# Patient Record
Sex: Male | Born: 1975 | Race: White | Hispanic: No | Marital: Married | Smoking: Former smoker
Health system: Southern US, Community
[De-identification: ages and names within clinical notes are randomized; demographics above are authoritative.]

## PROBLEM LIST (undated history)

## (undated) DIAGNOSIS — K7460 Unspecified cirrhosis of liver: Secondary | ICD-10-CM

## (undated) DIAGNOSIS — E079 Disorder of thyroid, unspecified: Secondary | ICD-10-CM

## (undated) DIAGNOSIS — K4090 Unilateral inguinal hernia, without obstruction or gangrene, not specified as recurrent: Secondary | ICD-10-CM

## (undated) HISTORY — DX: Unspecified cirrhosis of liver: K74.60

## (undated) HISTORY — PX: KNEE SURGERY: SHX244

## (undated) HISTORY — PX: APPENDECTOMY (OPEN): SHX54

## (undated) HISTORY — DX: Unilateral inguinal hernia, without obstruction or gangrene, not specified as recurrent: K40.90

---

## 1994-04-02 ENCOUNTER — Emergency Department: Admit: 1994-04-02 | Payer: Self-pay

## 1997-02-04 ENCOUNTER — Ambulatory Visit
Admission: EM | Admit: 1997-02-04 | Disposition: A | Discharge status: Home / Self Care | Payer: Self-pay | Admitting: Specialist

## 2001-07-06 ENCOUNTER — Emergency Department: Admit: 2001-07-06 | Payer: Self-pay | Admitting: Emergency Medicine

## 2002-11-08 ENCOUNTER — Emergency Department: Admit: 2002-11-08 | Payer: Self-pay | Source: Emergency Department | Admitting: Family Medicine

## 2004-01-24 ENCOUNTER — Emergency Department: Admit: 2004-01-24 | Payer: Self-pay | Source: Emergency Department | Admitting: Emergency Medicine

## 2008-07-26 ENCOUNTER — Inpatient Hospital Stay
Admission: EM | Admit: 2008-07-26 | Disposition: A | Discharge status: Home / Self Care | Payer: Self-pay | Source: Emergency Department | Admitting: Family Medicine

## 2008-08-05 LAB — ^CBC WITH DIFF MCKESSON
BASOPHILS %: 0.2 % (ref 0–2)
BASOPHILS %: 0.5 % (ref 0–2)
Baso(Absolute): 0
Baso(Absolute): 0
Eosinophils %: 4.7 % (ref 0–6)
Eosinophils %: 3.9 % (ref 0–6)
Eosinophils Absolute: 0.3
Eosinophils Absolute: 0.3
Hematocrit: 42.3 % (ref 39.0–49.0)
Hematocrit: 37.5 % — ABNORMAL LOW (ref 39.0–49.0)
Hematocrit: 33.9 % — ABNORMAL LOW (ref 39.0–49.0)
Hematocrit: 31.9 % — ABNORMAL LOW (ref 39.0–49.0)
Hematocrit: 33.2 % — ABNORMAL LOW (ref 39.0–49.0)
Hemoglobin: 15.1 g/dL (ref 13.2–17.3)
Hemoglobin: 13.4 g/dL (ref 13.2–17.3)
Hemoglobin: 12.2 g/dL — ABNORMAL LOW (ref 13.2–17.3)
Hemoglobin: 11.3 g/dL — ABNORMAL LOW (ref 13.2–17.3)
Hemoglobin: 11.8 g/dL — ABNORMAL LOW (ref 13.2–17.3)
Lymphocytes Absolute: 1.6
Lymphocytes Absolute: 1.7
Lymphocytes Relative: 25 % (ref 25–55)
Lymphocytes Relative: 24.3 % — ABNORMAL LOW (ref 25–55)
MCH: 36.1 pg — ABNORMAL HIGH (ref 27.0–34.0)
MCH: 36.7 pg — ABNORMAL HIGH (ref 27.0–34.0)
MCH: 37 pg — ABNORMAL HIGH (ref 27.0–34.0)
MCH: 36.6 pg — ABNORMAL HIGH (ref 27.0–34.0)
MCH: 36.8 pg — ABNORMAL HIGH (ref 27.0–34.0)
MCHC: 35.6 % (ref 32.0–36.0)
MCHC: 35.8 % (ref 32.0–36.0)
MCHC: 36 % (ref 32.0–36.0)
MCHC: 35.4 % (ref 32.0–36.0)
MCHC: 35.7 % (ref 32.0–36.0)
MCV: 101.5 fL — ABNORMAL HIGH (ref 80–100)
MCV: 102.5 fL — ABNORMAL HIGH (ref 80–100)
MCV: 102.8 fL — ABNORMAL HIGH (ref 80–100)
MCV: 103.3 fL — ABNORMAL HIGH (ref 80–100)
MCV: 103.2 fL — ABNORMAL HIGH (ref 80–100)
Monocytes Absolute: 1
Monocytes Absolute: 1
Monocytes Relative %: 16.4 % — ABNORMAL HIGH (ref 1–8)
Monocytes Relative %: 14.5 % — ABNORMAL HIGH (ref 1–8)
Neutrophils Absolute: 3.4
Neutrophils Absolute: 4
Neutrophils Relative %: 53.7 % (ref 49–69)
Neutrophils Relative %: 56.8 % (ref 49–69)
Platelets: 191 10*3/uL (ref 150–400)
Platelets: 151 10*3/uL (ref 150–400)
Platelets: 128 10*3/uL — ABNORMAL LOW (ref 150–400)
Platelets: 171 10*3/uL (ref 150–400)
Platelets: 211 10*3/uL (ref 150–400)
RBC: 4.17 /mm3 (ref 3.80–5.40)
RBC: 3.66 /mm3 — ABNORMAL LOW (ref 3.80–5.40)
RBC: 3.3 /mm3 — ABNORMAL LOW (ref 3.80–5.40)
RBC: 3.09 /mm3 — ABNORMAL LOW (ref 3.80–5.40)
RBC: 3.21 /mm3 — ABNORMAL LOW (ref 3.80–5.40)
RDW: 13.7 % (ref 11.0–14.0)
RDW: 14.1 % — ABNORMAL HIGH (ref 11.0–14.0)
RDW: 13.9 % (ref 11.0–14.0)
RDW: 14.1 % — ABNORMAL HIGH (ref 11.0–14.0)
RDW: 14.1 % — ABNORMAL HIGH (ref 11.0–14.0)
WBC: 12.3 10*3/uL — ABNORMAL HIGH (ref 4.8–10.8)
WBC: 12.6 10*3/uL — ABNORMAL HIGH (ref 4.8–10.8)
WBC: 12.9 10*3/uL — ABNORMAL HIGH (ref 4.8–10.8)
WBC: 6.3 10*3/uL (ref 4.8–10.8)
WBC: 7.1 10*3/uL (ref 4.8–10.8)

## 2008-08-05 LAB — COMPREHENSIVE METABOLIC PANEL
ALT: 165 U/L — ABNORMAL HIGH (ref 7–56)
ALT: 97 U/L — ABNORMAL HIGH (ref 7–56)
ALT: 60 U/L — ABNORMAL HIGH (ref 7–56)
ALT: 50 U/L (ref 7–56)
AST (SGOT): 350 U/L — ABNORMAL HIGH (ref 5–40)
AST (SGOT): 128 U/L — ABNORMAL HIGH (ref 5–40)
AST (SGOT): 73 U/L — ABNORMAL HIGH (ref 5–40)
AST (SGOT): 62 U/L — ABNORMAL HIGH (ref 5–40)
Albumin, Synovial: 4.5 g/dL (ref 3.9–5.0)
Albumin, Synovial: 3.7 g/dL — ABNORMAL LOW (ref 3.9–5.0)
Albumin, Synovial: 3.4 g/dL — ABNORMAL LOW (ref 3.9–5.0)
Albumin, Synovial: 3 g/dL — ABNORMAL LOW (ref 3.9–5.0)
Alkaline Phosphatase: 88 U/L (ref 38–126)
Alkaline Phosphatase: 69 U/L (ref 38–126)
Alkaline Phosphatase: 74 U/L (ref 38–126)
Alkaline Phosphatase: 79 U/L (ref 38–126)
BUN / Creatinine Ratio: 16 (ref 8–20)
BUN / Creatinine Ratio: 12 (ref 8–20)
BUN / Creatinine Ratio: 10 (ref 8–20)
BUN / Creatinine Ratio: 10 (ref 8–20)
BUN: 15 mg/dL (ref 6–20)
BUN: 11 mg/dL (ref 6–20)
BUN: 8 mg/dL (ref 6–20)
BUN: 7 mg/dL (ref 6–20)
Bilirubin, Total: 1.6 mg/dL — ABNORMAL HIGH (ref 0.2–1.3)
Bilirubin, Total: 1.4 mg/dL — ABNORMAL HIGH (ref 0.2–1.3)
Bilirubin, Total: 1.5 mg/dL — ABNORMAL HIGH (ref 0.2–1.3)
Bilirubin, Total: 0.7 mg/dL (ref 0.2–1.3)
CO2: 24 mmol/L (ref 21.0–31.0)
CO2: 24 mmol/L (ref 21.0–31.0)
CO2: 23 mmol/L (ref 21.0–31.0)
CO2: 24 mmol/L (ref 21.0–31.0)
Calcium: 9.5 mg/dL (ref 8.4–10.2)
Calcium: 8.6 mg/dL (ref 8.4–10.2)
Calcium: 8.4 mg/dL (ref 8.4–10.2)
Calcium: 8.3 mg/dL — ABNORMAL LOW (ref 8.4–10.2)
Chloride: 99 mmol/L — ABNORMAL LOW (ref 101–111)
Chloride: 101 mmol/L (ref 101–111)
Chloride: 100 mmol/L — ABNORMAL LOW (ref 101–111)
Chloride: 106 mmol/L (ref 101–111)
Creatinine: 0.95 mg/dL (ref 0.66–1.25)
Creatinine: 0.89 mg/dL (ref 0.66–1.25)
Creatinine: 0.81 mg/dL (ref 0.66–1.25)
Creatinine: 0.76 mg/dL (ref 0.66–1.25)
EGFR: >60 mL/min/{1.73_m2}
EGFR: >60 mL/min/{1.73_m2}
EGFR: >60 mL/min/{1.73_m2}
EGFR: >60 mL/min/{1.73_m2}
EGFR: >60 mL/min/{1.73_m2}
EGFR: >60 mL/min/{1.73_m2}
EGFR: >60 mL/min/{1.73_m2}
EGFR: >60 mL/min/{1.73_m2}
Glucose: 89 mg/dL (ref 70–100)
Glucose: 69 mg/dL — ABNORMAL LOW (ref 70–100)
Glucose: 67 mg/dL — ABNORMAL LOW (ref 70–100)
Glucose: 86 mg/dL (ref 70–100)
Potassium: 3.8 mmol/L (ref 3.6–5.0)
Potassium: 4.1 mmol/L (ref 3.6–5.0)
Potassium: 3.6 mmol/L (ref 3.6–5.0)
Potassium: 3.3 mmol/L — ABNORMAL LOW (ref 3.6–5.0)
Protein, Total: 8 g/dL (ref 6.3–8.2)
Protein, Total: 6.7 g/dL (ref 6.3–8.2)
Protein, Total: 6.5 g/dL (ref 6.3–8.2)
Protein, Total: 5.9 g/dL — ABNORMAL LOW (ref 6.3–8.2)
Sodium: 133 mmol/L — ABNORMAL LOW (ref 135–145)
Sodium: 134 mmol/L — ABNORMAL LOW (ref 135–145)
Sodium: 131 mmol/L — ABNORMAL LOW (ref 135–145)
Sodium: 137 mmol/L (ref 135–145)

## 2008-08-05 LAB — ^MANUAL DIFFERENTIAL MCKESSON
Band Neutrophils Manual: 4 %
Band Neutrophils Manual: 2 %
Band Neutrophils Manual: 1 %
Basophils Manual: 1 % (ref 0–2)
CELLS COUNTED: 100
CELLS COUNTED: 100
CELLS COUNTED: 100
Eosinophils Manual: 3 % (ref 0–6)
Lymphocytes Manual: 14 % — ABNORMAL LOW (ref 25–55)
Lymphocytes Manual: 13 % — ABNORMAL LOW (ref 25–55)
Lymphocytes Manual: 5 % — ABNORMAL LOW (ref 25–55)
Monocytes: 7 % (ref 1–8)
Monocytes: 9 % — ABNORMAL HIGH (ref 1–8)
Monocytes: 16 % — ABNORMAL HIGH (ref 1–8)
Platelet Evaluation: NORMAL
Platelet Evaluation: NORMAL
Platelet Evaluation: DECREASED
RBC Morphology: NORMAL
RBC Morphology: NORMAL
RBC Morphology: NORMAL
Segmented Neutrophils: 75 % — ABNORMAL HIGH (ref 49–69)
Segmented Neutrophils: 72 % — ABNORMAL HIGH (ref 49–69)
Segmented Neutrophils: 78 % — ABNORMAL HIGH (ref 49–69)

## 2008-08-05 LAB — STOOL FOR WBC
Stool WBC: NONE SEEN
Stool WBC: NONE SEEN

## 2008-08-05 LAB — BASIC METABOLIC PANEL
BUN / Creatinine Ratio: 11 (ref 8–20)
BUN: 8 mg/dL (ref 6–20)
CO2: 24 mmol/L (ref 21.0–31.0)
Calcium: 9.3 mg/dL (ref 8.4–10.2)
Chloride: 106 mmol/L (ref 101–111)
Creatinine: 0.77 mg/dL (ref 0.66–1.25)
EGFR: >60 mL/min/{1.73_m2}
EGFR: >60 mL/min/{1.73_m2}
Glucose: 73 mg/dL (ref 70–100)
Potassium: 3.9 mmol/L (ref 3.6–5.0)
Sodium: 138 mmol/L (ref 135–145)

## 2008-08-05 LAB — CLOSTRIDIUM DIFFICILE TOXINS A/T/B
Stool Clostridium difficile Toxin A and B: NEGATIVE
Stool Clostridium difficile Toxin A and B: NEGATIVE

## 2008-08-05 LAB — TRIGLYCERIDES: Triglycerides: 295 mg/dL — ABNORMAL HIGH (ref 27–125)

## 2008-08-05 LAB — LIPASE
Lipase: 887 U/L — ABNORMAL HIGH (ref 23–300)
Lipase: 1222 U/L — ABNORMAL HIGH (ref 23–300)
Lipase: 466 U/L — ABNORMAL HIGH (ref 23–300)
Lipase: 719 U/L — ABNORMAL HIGH (ref 23–300)
Lipase: 1079 U/L — ABNORMAL HIGH (ref 23–300)

## 2008-08-05 LAB — T4, FREE: T4 Free: 1 ng/dL (ref 0.78–2.19)

## 2008-08-05 LAB — MAGNESIUM: Magnesium: 2.1 mg/dL (ref 1.7–2.2)

## 2008-08-05 LAB — TSH, 3RD GENERATION: TSH, 3rd Generation: 10.7 mIU/L — ABNORMAL HIGH (ref 0.465–4.680)

## 2008-10-19 ENCOUNTER — Emergency Department
Admission: EM | Admit: 2008-10-19 | Disposition: A | Discharge status: Home / Self Care | Payer: Self-pay | Source: Emergency Department | Admitting: Emergency Medical Services

## 2008-12-29 ENCOUNTER — Emergency Department
Admission: EM | Admit: 2008-12-29 | Disposition: A | Discharge status: Home / Self Care | Payer: Self-pay | Source: Emergency Department | Admitting: Emergency Medicine

## 2009-01-12 ENCOUNTER — Inpatient Hospital Stay
Admission: EM | Admit: 2009-01-12 | Disposition: A | Discharge status: Home / Self Care | Payer: Self-pay | Source: Emergency Department | Admitting: Internal Medicine

## 2009-01-14 LAB — URINALYSIS
Bilirubin, UA: NEGATIVE
Blood, UA: NEGATIVE
Glucose, UA: NEGATIVE
Ketones UA: NEGATIVE
Leukocyte Esterase, UA: NEGATIVE
Nitrate: NEGATIVE
Protein, UR: NEGATIVE
Specific Gravity, UR: 1.016 (ref 1.000–1.035)
Urobilinogen, UA: NORMAL
pH, Urine: 7 (ref 5.0–8.0)

## 2009-01-14 LAB — CBC AND DIFFERENTIAL
BASOPHILS %: 0.2 % (ref 0.0–2.0)
BASOPHILS %: 0 % (ref 0.0–2.0)
Baso(Absolute): 0.01 10*3/uL (ref 0.00–0.20)
Baso(Absolute): 0 10*3/uL (ref 0.00–0.20)
Eosinophils %: 0.3 % (ref 0.0–6.0)
Eosinophils %: 0 % (ref 0.0–6.0)
Eosinophils Absolute: 0.02 10*3/uL — ABNORMAL LOW (ref 0.10–0.30)
Eosinophils Absolute: 0 10*3/uL — ABNORMAL LOW (ref 0.10–0.30)
Hematocrit: 37.9 % — ABNORMAL LOW (ref 39.0–49.0)
Hematocrit: 36.1 % — ABNORMAL LOW (ref 39.0–49.0)
Hemoglobin: 13.5 g/dL (ref 13.2–17.3)
Hemoglobin: 12.6 g/dL — ABNORMAL LOW (ref 13.2–17.3)
Immature Granulocytes #: 0.01 10*3/uL (ref 0.00–0.05)
Immature Granulocytes #: 0.01 10*3/uL (ref 0.00–0.05)
Immature Granulocytes %: 0.2 % — ABNORMAL HIGH (ref 0.0–0.0)
Immature Granulocytes %: 0.3 % — ABNORMAL HIGH (ref 0.0–0.0)
Lymphocytes Absolute: 2.1 10*3/uL (ref 1.00–4.80)
Lymphocytes Absolute: 0.45 10*3/uL — ABNORMAL LOW (ref 1.00–4.80)
Lymphocytes Relative: 33.1 % (ref 25.0–55.0)
Lymphocytes Relative: 12.8 % — ABNORMAL LOW (ref 25.0–55.0)
MCH: 36 pg — ABNORMAL HIGH (ref 27.0–34.0)
MCH: 35.3 pg — ABNORMAL HIGH (ref 27.0–34.0)
MCHC: 35.6 g/dL (ref 32.0–36.0)
MCHC: 34.9 g/dL (ref 32.0–36.0)
MCV: 101.1 fL — ABNORMAL HIGH (ref 80–100)
MCV: 101.1 fL — ABNORMAL HIGH (ref 80–100)
MPV: 11.7 fL (ref 9.0–13.0)
MPV: 11.6 fL (ref 9.0–13.0)
Monocytes Absolute: 0.71 10*3/uL (ref 0.10–1.20)
Monocytes Absolute: 0.14 10*3/uL (ref 0.10–1.20)
Monocytes Relative %: 11.2 % — ABNORMAL HIGH (ref 1.0–8.0)
Monocytes Relative %: 4 % (ref 1.0–8.0)
Neutrophils Absolute: 3.5 10*3/uL (ref 1.80–7.70)
Neutrophils Absolute: 2.93 10*3/uL (ref 1.80–7.70)
Neutrophils Relative %: 55.2 % (ref 49.0–69.0)
Neutrophils Relative %: 83.2 % — ABNORMAL HIGH (ref 49.0–69.0)
Nucleated RBC %: 0 /100WBC (ref 0.0–0.0)
Nucleated RBC %: 0 /100WBC (ref 0.0–0.0)
Nucleted RBC #: 0 10*3/uL (ref 0.00–0.00)
Nucleted RBC #: 0 10*3/uL (ref 0.00–0.00)
Platelets: 150 10*3/uL (ref 150–400)
Platelets: 126 10*3/uL — ABNORMAL LOW (ref 150–400)
RBC: 3.75 M/uL — ABNORMAL LOW (ref 3.80–5.40)
RBC: 3.57 M/uL — ABNORMAL LOW (ref 3.80–5.40)
RDW: 13.6 % (ref 11.0–14.0)
RDW: 13.5 % (ref 11.0–14.0)
WBC: 6.34 10*3/uL (ref 4.80–10.80)
WBC: 3.52 10*3/uL — ABNORMAL LOW (ref 4.80–10.80)

## 2009-01-14 LAB — COMPREHENSIVE METABOLIC PANEL
ALT: 100 U/L — ABNORMAL HIGH (ref 7–56)
ALT: 77 U/L — ABNORMAL HIGH (ref 7–56)
AST (SGOT): 170 U/L — ABNORMAL HIGH (ref 5–40)
AST (SGOT): 128 U/L — ABNORMAL HIGH (ref 5–40)
Albumin, Synovial: 4.7 g/dL (ref 3.9–5.0)
Albumin, Synovial: 3.9 g/dL (ref 3.9–5.0)
Alkaline Phosphatase: 93 U/L (ref 38–126)
Alkaline Phosphatase: 86 U/L (ref 38–126)
BUN / Creatinine Ratio: 8 (ref 8–20)
BUN / Creatinine Ratio: 4 — ABNORMAL LOW (ref 8–20)
BUN: 6 mg/dL (ref 6–20)
BUN: 2 mg/dL — ABNORMAL LOW (ref 6–20)
Bilirubin, Total: 0.4 mg/dL (ref 0.2–1.3)
Bilirubin, Total: 0.5 mg/dL (ref 0.2–1.3)
CO2: 26 mmol/L (ref 21.0–31.0)
CO2: 26 mmol/L (ref 21.0–31.0)
Calcium: 9.1 mg/dL (ref 8.4–10.2)
Calcium: 8.3 mg/dL — ABNORMAL LOW (ref 8.4–10.2)
Chloride: 101 mmol/L (ref 101–111)
Chloride: 102 mmol/L (ref 101–111)
Creatinine: 0.79 mg/dL (ref 0.66–1.25)
Creatinine: 0.66 mg/dL (ref 0.66–1.25)
EGFR: >60 mL/min/{1.73_m2}
EGFR: >60 mL/min/{1.73_m2}
EGFR: >60 mL/min/{1.73_m2}
EGFR: >60 mL/min/{1.73_m2}
Glucose: 84 mg/dL (ref 70–100)
Glucose: 125 mg/dL — ABNORMAL HIGH (ref 70–100)
Potassium: 2.8 mmol/L — ABNORMAL LOW (ref 3.6–5.0)
Potassium: 4.4 mmol/L (ref 3.6–5.0)
Protein, Total: 8.1 g/dL (ref 6.3–8.2)
Protein, Total: 6.7 g/dL (ref 6.3–8.2)
Sodium: 140 mmol/L (ref 135–145)
Sodium: 136 mmol/L (ref 135–145)

## 2009-01-14 LAB — VITAMIN B12 AND FOLATE
Folate: 2.1 ng/mL — ABNORMAL LOW (ref 2.7–21.0)
Folate: 5.5 ng/mL (ref 2.7–21.0)
Vitamin B-12: 563 pg/mL (ref 239–931)
Vitamin B-12: 531 pg/mL (ref 239–931)

## 2009-01-14 LAB — TSH: TSH, 3rd Generation: 9.73 mIU/L — ABNORMAL HIGH (ref 0.465–4.680)

## 2009-01-14 LAB — TOXICOLOGY SCREEN, SERUM
Barbiturate Screen, UR: NEGATIVE
Benzodiazepine Screen, UR: NEGATIVE
Cocaine Screen: NEGATIVE
Opiate Screen: NEGATIVE
PCP Screen: NEGATIVE
THC Screen: NEGATIVE
Urine Amphetamine Screen: NEGATIVE

## 2009-01-14 LAB — SALICYLATE LEVEL: Salicylate Level: <1 mg/dL (ref 0.0–20.0)

## 2009-01-14 LAB — PT (PROTHROMBIN TIME)
PT INR: 1.1
PT: 12.4 s (ref 10.6–12.8)

## 2009-01-14 LAB — T4, FREE: T4 Free: 0.87 ng/dL (ref 0.78–2.19)

## 2009-01-14 LAB — CK
Creatine Kinase (CK): 176 U/L (ref 19–216)
Creatine Kinase (CK): 145 U/L (ref 19–216)
Creatine Kinase (CK): 137 U/L (ref 19–216)
Creatine Kinase (CK): 141 U/L (ref 19–216)

## 2009-01-14 LAB — ALCOHOL, SERUM: Alcohol Screen Serum: 31 mg/dL (ref 0–50)

## 2009-01-14 LAB — MAGNESIUM
Magnesium: 1.7 mg/dL (ref 1.7–2.2)
Magnesium: 1.6 mg/dL — ABNORMAL LOW (ref 1.7–2.2)

## 2009-01-14 LAB — ACETAMINOPHEN LEVEL: Acetaminophen Level: <10 ug/mL — ABNORMAL LOW (ref 10.0–30.0)

## 2009-01-14 LAB — ^LIPID PROFILE MCKESSON
Cholesterol: 162 mg/dL (ref 140–200)
Coronary Heart Disease Risk: 2 (ref 1.0–6.5)
HDL Cholesterol,  Direct: 68 mg/dL (ref 40–200)
LDL: 79 mg/dL (ref 66–178)
Triglycerides: 75 mg/dL (ref 27–125)
VLDL: 15 mg/dL (ref 2–38)

## 2009-01-14 LAB — AMYLASE: Amylase: 38 U/L (ref 29–110)

## 2009-01-14 LAB — PTT (PARTIAL THROMBOPLASTIN TIME)
PTT Ratio: 0.9 (ref 0.8–1.2)
PTT: 24.8 s (ref 21.6–34.0)

## 2009-01-14 LAB — TROPONIN I
Troponin I: <0.01 ng/mL (ref 0.000–0.034)
Troponin I: <0.01 ng/mL (ref 0.000–0.034)

## 2009-01-14 LAB — MYOGLOBIN, SERUM: Myoglobin: 74 ng/mL (ref 0–121)

## 2009-01-14 LAB — LIPASE
Lipase: 121 U/L (ref 23–300)
Lipase: 55 U/L (ref 23–300)

## 2009-01-14 LAB — D-DIMER, QUANTITATIVE: D-Dimer, Quant.: 221 ng/mL

## 2009-02-07 ENCOUNTER — Inpatient Hospital Stay
Admission: EM | Admit: 2009-02-07 | Disposition: A | Discharge status: Home / Self Care | Payer: Self-pay | Source: Emergency Department | Admitting: Internal Medicine

## 2009-02-15 LAB — CBC AND DIFFERENTIAL
BASOPHILS %: 0.1 % (ref 0.0–2.0)
BASOPHILS %: 0 % (ref 0.0–2.0)
BASOPHILS %: 0.1 % (ref 0.0–2.0)
BASOPHILS %: 0.1 % (ref 0.0–2.0)
Baso(Absolute): 0.01 10*3/uL (ref 0.00–0.20)
Baso(Absolute): 0 10*3/uL (ref 0.00–0.20)
Baso(Absolute): 0.01 10*3/uL (ref 0.00–0.20)
Baso(Absolute): 0.01 10*3/uL (ref 0.00–0.20)
Eosinophils %: 2 % (ref 0.0–6.0)
Eosinophils %: 1.2 % (ref 0.0–6.0)
Eosinophils %: 0.3 % (ref 0.0–6.0)
Eosinophils %: 3.8 % (ref 0.0–6.0)
Eosinophils Absolute: 0.23 10*3/uL (ref 0.10–0.30)
Eosinophils Absolute: 0.13 10*3/uL (ref 0.10–0.30)
Eosinophils Absolute: 0.04 10*3/uL — ABNORMAL LOW (ref 0.10–0.30)
Eosinophils Absolute: 0.31 10*3/uL — ABNORMAL HIGH (ref 0.10–0.30)
Hematocrit: 38.8 % — ABNORMAL LOW (ref 39.0–49.0)
Hematocrit: 41.4 % (ref 39.0–49.0)
Hematocrit: 38.7 % — ABNORMAL LOW (ref 39.0–49.0)
Hematocrit: 33.7 % — ABNORMAL LOW (ref 39.0–49.0)
Hematocrit: 31.8 % — ABNORMAL LOW (ref 39.0–49.0)
Hemoglobin: 14 g/dL (ref 13.2–17.3)
Hemoglobin: 14.5 g/dL (ref 13.2–17.3)
Hemoglobin: 13.7 g/dL (ref 13.2–17.3)
Hemoglobin: 12.3 g/dL — ABNORMAL LOW (ref 13.2–17.3)
Hemoglobin: 11.3 g/dL — ABNORMAL LOW (ref 13.2–17.3)
Immature Granulocytes #: 0.04 10*3/uL (ref 0.00–0.05)
Immature Granulocytes #: 0.06 10*3/uL — ABNORMAL HIGH (ref 0.00–0.05)
Immature Granulocytes #: 0.02 10*3/uL (ref 0.00–0.05)
Immature Granulocytes %: 0.4 % — ABNORMAL HIGH (ref 0.0–0.0)
Immature Granulocytes %: 0.4 % — ABNORMAL HIGH (ref 0.0–0.0)
Immature Granulocytes %: 0.2 % — ABNORMAL HIGH (ref 0.0–0.0)
Lymphocytes Absolute: 3.63 10*3/uL (ref 1.00–4.80)
Lymphocytes Absolute: 1.14 10*3/uL (ref 1.00–4.80)
Lymphocytes Absolute: 1.6 10*3/uL (ref 1.00–4.80)
Lymphocytes Absolute: 1.64 10*3/uL (ref 1.00–4.80)
Lymphocytes Relative: 32.2 % (ref 25.0–55.0)
Lymphocytes Relative: 10.9 % — ABNORMAL LOW (ref 25.0–55.0)
Lymphocytes Relative: 11.4 % — ABNORMAL LOW (ref 25.0–55.0)
Lymphocytes Relative: 20.3 % — ABNORMAL LOW (ref 25.0–55.0)
MCH: 36.2 pg — ABNORMAL HIGH (ref 27.0–34.0)
MCH: 35.5 pg — ABNORMAL HIGH (ref 27.0–34.0)
MCH: 35.8 pg — ABNORMAL HIGH (ref 27.0–34.0)
MCH: 35.7 pg — ABNORMAL HIGH (ref 27.0–34.0)
MCH: 34.7 pg — ABNORMAL HIGH (ref 27.0–34.0)
MCHC: 36.1 g/dL — ABNORMAL HIGH (ref 32.0–36.0)
MCHC: 35 g/dL (ref 32.0–36.0)
MCHC: 35.4 g/dL (ref 32.0–36.0)
MCHC: 36.5 g/dL — ABNORMAL HIGH (ref 32.0–36.0)
MCHC: 35.5 g/dL (ref 32.0–36.0)
MCV: 100.3 fL — ABNORMAL HIGH (ref 80–100)
MCV: 101.2 fL — ABNORMAL HIGH (ref 80–100)
MCV: 101 fL — ABNORMAL HIGH (ref 80–100)
MCV: 97.7 fL (ref 80–100)
MCV: 97.5 fL (ref 80–100)
MPV: 11.4 fL (ref 9.0–13.0)
MPV: 11.3 fL (ref 9.0–13.0)
MPV: 11.7 fL (ref 9.0–13.0)
MPV: 11.4 fL (ref 9.0–13.0)
MPV: 10.8 fL (ref 9.0–13.0)
Monocytes Absolute: 1.11 10*3/uL (ref 0.10–1.20)
Monocytes Absolute: 0.61 10*3/uL (ref 0.10–1.20)
Monocytes Absolute: 1.12 10*3/uL (ref 0.10–1.20)
Monocytes Absolute: 1.22 10*3/uL — ABNORMAL HIGH (ref 0.10–1.20)
Monocytes Relative %: 9.8 % — ABNORMAL HIGH (ref 1.0–8.0)
Monocytes Relative %: 5.8 % (ref 1.0–8.0)
Monocytes Relative %: 8 % (ref 1.0–8.0)
Monocytes Relative %: 15.1 % — ABNORMAL HIGH (ref 1.0–8.0)
Neutrophils Absolute: 6.31 10*3/uL (ref 1.80–7.70)
Neutrophils Absolute: 8.56 10*3/uL — ABNORMAL HIGH (ref 1.80–7.70)
Neutrophils Absolute: 11.21 10*3/uL — ABNORMAL HIGH (ref 1.80–7.70)
Neutrophils Absolute: 4.91 10*3/uL (ref 1.80–7.70)
Neutrophils Relative %: 55.9 % (ref 49.0–69.0)
Neutrophils Relative %: 82.1 % — ABNORMAL HIGH (ref 49.0–69.0)
Neutrophils Relative %: 80.2 % — ABNORMAL HIGH (ref 49.0–69.0)
Neutrophils Relative %: 60.7 % (ref 49.0–69.0)
Nucleated RBC %: 0 /100WBC (ref 0.0–0.0)
Nucleated RBC %: 0 /100WBC (ref 0.0–0.0)
Nucleated RBC %: 0 /100WBC (ref 0.0–0.0)
Nucleated RBC %: 0 /100WBC (ref 0.0–0.0)
Nucleted RBC #: 0 10*3/uL (ref 0.00–0.00)
Nucleted RBC #: 0 10*3/uL (ref 0.00–0.00)
Nucleted RBC #: 0 10*3/uL (ref 0.00–0.00)
Nucleted RBC #: 0 10*3/uL (ref 0.00–0.00)
Platelets: 163 10*3/uL (ref 150–400)
Platelets: 158 10*3/uL (ref 150–400)
Platelets: 110 10*3/uL — ABNORMAL LOW (ref 150–400)
Platelets: 124 10*3/uL — ABNORMAL LOW (ref 150–400)
Platelets: 169 10*3/uL (ref 150–400)
RBC: 3.87 M/uL (ref 3.80–5.40)
RBC: 4.09 M/uL (ref 3.80–5.40)
RBC: 3.83 M/uL (ref 3.80–5.40)
RBC: 3.45 M/uL — ABNORMAL LOW (ref 3.80–5.40)
RBC: 3.26 M/uL — ABNORMAL LOW (ref 3.80–5.40)
RDW: 11.5 % (ref 11.0–14.0)
RDW: 12.5 % (ref 11.0–14.0)
RDW: 12.6 % (ref 11.0–14.0)
RDW: 12.2 % (ref 11.0–14.0)
RDW: 12.2 % (ref 11.0–14.0)
WBC: 11.29 10*3/uL — ABNORMAL HIGH (ref 4.80–10.80)
WBC: 16.28 10*3/uL — ABNORMAL HIGH (ref 4.80–10.80)
WBC: 10.44 10*3/uL (ref 4.80–10.80)
WBC: 13.98 10*3/uL — ABNORMAL HIGH (ref 4.80–10.80)
WBC: 8.09 10*3/uL (ref 4.80–10.80)

## 2009-02-15 LAB — MAN DIFF ONLY
Band Neutrophils Manual: 6 % — ABNORMAL HIGH (ref 0.00–5.00)
Basophil # Calc: 0 10*3/uL (ref 0.00–0.20)
Basophils Manual: 0 % (ref 0.00–2.00)
CELLS COUNTED: 100
Cell Morphology:: NORMAL
Eosinoph # Calc: 0 10*3/uL — ABNORMAL LOW (ref 0.10–0.30)
Eosinophils Manual: 0 % (ref 0.00–6.00)
Lymph # Calc: 0 10*3/uL — ABNORMAL LOW (ref 1.00–4.80)
Lymphocytes Manual: 0 % — ABNORMAL LOW (ref 25.00–55.00)
Monocyte # Calc: 0.49 10*3/uL (ref 0.10–1.20)
Monocytes: 3 % (ref 1.00–8.00)
Neut # Calc: 14.81 10*3/uL — ABNORMAL HIGH (ref 1.80–7.70)
Neut Bands # Calc: 0.98 10*3/uL — ABNORMAL HIGH (ref 0.00–0.20)
Platelet Evaluation: NORMAL
Segmented Neutrophils: 91 % — ABNORMAL HIGH (ref 49.00–69.00)

## 2009-02-15 LAB — URINALYSIS
Bilirubin, UA: NEGATIVE
Blood, UA: NEGATIVE
Glucose, UA: NEGATIVE
Ketones UA: NEGATIVE
Leukocyte Esterase, UA: NEGATIVE
Nitrate: NEGATIVE
Specific Gravity, UR: >1.035 — ABNORMAL HIGH (ref 1.000–1.035)
Urobilinogen, UA: NORMAL
pH, Urine: 5.5 (ref 5.0–8.0)

## 2009-02-15 LAB — COMPREHENSIVE METABOLIC PANEL
ALT: 56 U/L (ref 7–56)
ALT: 34 U/L (ref 7–56)
ALT: 27 U/L (ref 7–56)
AST (SGOT): 73 U/L — ABNORMAL HIGH (ref 5–40)
AST (SGOT): 45 U/L — ABNORMAL HIGH (ref 5–40)
AST (SGOT): 39 U/L (ref 5–40)
Albumin, Synovial: 4.5 g/dL (ref 3.9–5.0)
Albumin, Synovial: 3.1 g/dL — ABNORMAL LOW (ref 3.9–5.0)
Albumin, Synovial: 2.7 g/dL — ABNORMAL LOW (ref 3.9–5.0)
Alkaline Phosphatase: 80 U/L (ref 38–126)
Alkaline Phosphatase: 64 U/L (ref 38–126)
Alkaline Phosphatase: 60 U/L (ref 38–126)
BUN / Creatinine Ratio: 13 (ref 8–20)
BUN / Creatinine Ratio: 25 — ABNORMAL HIGH (ref 8–20)
BUN / Creatinine Ratio: 17 (ref 8–20)
BUN: 11 mg/dL (ref 6–20)
BUN: 18 mg/dL (ref 6–20)
BUN: 10 mg/dL (ref 6–20)
Bilirubin, Total: 0.6 mg/dL (ref 0.2–1.3)
Bilirubin, Total: 0.4 mg/dL (ref 0.2–1.3)
Bilirubin, Total: 0.5 mg/dL (ref 0.2–1.3)
CO2: 22 mmol/L (ref 21.0–31.0)
CO2: 22 mmol/L (ref 21.0–31.0)
CO2: 27 mmol/L (ref 21.0–31.0)
Calcium: 9.7 mg/dL (ref 8.4–10.2)
Calcium: 7.7 mg/dL — ABNORMAL LOW (ref 8.4–10.2)
Calcium: 8.3 mg/dL — ABNORMAL LOW (ref 8.4–10.2)
Chloride: 105 mmol/L (ref 101–111)
Chloride: 106 mmol/L (ref 101–111)
Chloride: 103 mmol/L (ref 101–111)
Creatinine: 0.84 mg/dL (ref 0.66–1.25)
Creatinine: 0.71 mg/dL (ref 0.66–1.25)
Creatinine: 0.58 mg/dL — ABNORMAL LOW (ref 0.66–1.25)
EGFR: >60 mL/min/{1.73_m2}
EGFR: >60 mL/min/{1.73_m2}
EGFR: >60 mL/min/{1.73_m2}
EGFR: >60 mL/min/{1.73_m2}
EGFR: >60 mL/min/{1.73_m2}
EGFR: >60 mL/min/{1.73_m2}
Glucose: 78 mg/dL (ref 70–100)
Glucose: 75 mg/dL (ref 70–100)
Glucose: 71 mg/dL (ref 70–100)
Potassium: 3.8 mmol/L (ref 3.6–5.0)
Potassium: 3.2 mmol/L — ABNORMAL LOW (ref 3.6–5.0)
Potassium: 3.6 mmol/L (ref 3.6–5.0)
Protein, Total: 7.7 g/dL (ref 6.3–8.2)
Protein, Total: 5.5 g/dL — ABNORMAL LOW (ref 6.3–8.2)
Protein, Total: 5.4 g/dL — ABNORMAL LOW (ref 6.3–8.2)
Sodium: 138 mmol/L (ref 135–145)
Sodium: 137 mmol/L (ref 135–145)
Sodium: 135 mmol/L (ref 135–145)

## 2009-02-15 LAB — MAGNESIUM
Magnesium: 1.9 mg/dL (ref 1.7–2.2)
Magnesium: 2 mg/dL (ref 1.7–2.2)
Magnesium: 2 mg/dL (ref 1.7–2.2)

## 2009-02-15 LAB — PTT (PARTIAL THROMBOPLASTIN TIME)
PTT Ratio: 1.1 (ref 0.8–1.2)
PTT: 30.1 s (ref 21.6–34.0)

## 2009-02-15 LAB — URINALYSIS WITH MICROSCOPIC

## 2009-02-15 LAB — MORPH REVIEW
Cell Morphology:: NORMAL
Platelet Evaluation: NORMAL

## 2009-02-15 LAB — PT (PROTHROMBIN TIME)
PT INR: 1.1
PT: 12.3 s (ref 10.6–12.8)

## 2009-02-15 LAB — BASIC METABOLIC PANEL
BUN / Creatinine Ratio: 17 (ref 8–20)
BUN: 10 mg/dL (ref 6–20)
CO2: 25 mmol/L (ref 21.0–31.0)
Calcium: 7.9 mg/dL — ABNORMAL LOW (ref 8.4–10.2)
Chloride: 102 mmol/L (ref 101–111)
Creatinine: 0.58 mg/dL — ABNORMAL LOW (ref 0.66–1.25)
EGFR: >60 mL/min/{1.73_m2}
EGFR: >60 mL/min/{1.73_m2}
Glucose: 89 mg/dL (ref 70–100)
Potassium: 3.1 mmol/L — ABNORMAL LOW (ref 3.6–5.0)
Sodium: 133 mmol/L — ABNORMAL LOW (ref 135–145)

## 2009-02-15 LAB — AMYLASE
Amylase: 313 U/L — ABNORMAL HIGH (ref 29–110)
Amylase: 152 U/L — ABNORMAL HIGH (ref 29–110)

## 2009-02-15 LAB — LIPASE
Lipase: 9809 U/L — ABNORMAL HIGH (ref 23–300)
Lipase: 3153 U/L — ABNORMAL HIGH (ref 23–300)
Lipase: 1265 U/L — ABNORMAL HIGH (ref 23–300)
Lipase: 502 U/L — ABNORMAL HIGH (ref 23–300)

## 2009-02-15 LAB — ALCOHOL, SERUM: Alcohol Screen Serum: 48 mg/dL (ref 0–50)

## 2009-05-18 ENCOUNTER — Emergency Department
Admission: EM | Admit: 2009-05-18 | Disposition: A | Discharge status: Home / Self Care | Payer: Self-pay | Source: Emergency Department | Admitting: Emergency Medicine

## 2009-09-07 ENCOUNTER — Inpatient Hospital Stay
Admission: EM | Admit: 2009-09-07 | Disposition: A | Discharge status: Home / Self Care | Payer: Self-pay | Source: Emergency Department | Admitting: Internal Medicine

## 2009-09-11 LAB — COMPREHENSIVE METABOLIC PANEL
ALT: 67 U/L — ABNORMAL HIGH (ref 7–56)
ALT: 62 U/L — ABNORMAL HIGH (ref 7–56)
AST (SGOT): 87 U/L — ABNORMAL HIGH (ref 5–40)
AST (SGOT): 73 U/L — ABNORMAL HIGH (ref 5–40)
Albumin, Synovial: 5.2 g/dL — ABNORMAL HIGH (ref 3.9–5.0)
Albumin, Synovial: 4.3 g/dL (ref 3.9–5.0)
Alkaline Phosphatase: 69 U/L (ref 38–126)
Alkaline Phosphatase: 62 U/L (ref 38–126)
BUN / Creatinine Ratio: 11 (ref 8–20)
BUN / Creatinine Ratio: 10 (ref 8–20)
BUN: 8 mg/dL (ref 6–20)
BUN: 8 mg/dL (ref 6–20)
Bilirubin, Total: 0.2 mg/dL (ref 0.2–1.3)
Bilirubin, Total: <0.1 mg/dL — ABNORMAL LOW (ref 0.2–1.3)
CO2: 24 mmol/L (ref 21.0–31.0)
CO2: 23 mmol/L (ref 21.0–31.0)
Calcium: 9.7 mg/dL (ref 8.4–10.2)
Calcium: 8.3 mg/dL — ABNORMAL LOW (ref 8.4–10.2)
Chloride: 107 mmol/L (ref 101–111)
Chloride: 108 mmol/L (ref 101–111)
Creatinine: 0.75 mg/dL (ref 0.66–1.25)
Creatinine: 0.76 mg/dL (ref 0.66–1.25)
EGFR: >60 mL/min/{1.73_m2}
EGFR: >60 mL/min/{1.73_m2}
EGFR: >60 mL/min/{1.73_m2}
EGFR: >60 mL/min/{1.73_m2}
Glucose: 92 mg/dL (ref 70–100)
Glucose: 55 mg/dL — ABNORMAL LOW (ref 70–100)
Potassium: 4 mmol/L (ref 3.6–5.0)
Potassium: 3.9 mmol/L (ref 3.6–5.0)
Protein, Total: 8 g/dL (ref 6.3–8.2)
Protein, Total: 6.6 g/dL (ref 6.3–8.2)
Sodium: 143 mmol/L (ref 135–145)
Sodium: 142 mmol/L (ref 135–145)

## 2009-09-11 LAB — TOXICOLOGY SCREEN, SERUM
Barbiturate Screen, UR: NEGATIVE
Benzodiazepine Screen, UR: NEGATIVE
Cocaine Screen: NEGATIVE
Opiate Screen: POSITIVE
PCP Screen: NEGATIVE
THC Screen: NEGATIVE
Urine Amphetamine Screen: NEGATIVE

## 2009-09-11 LAB — BASIC METABOLIC PANEL
BUN / Creatinine Ratio: 7 — ABNORMAL LOW (ref 8–20)
BUN: 4 mg/dL — ABNORMAL LOW (ref 6–20)
CO2: 27 mmol/L (ref 21.0–31.0)
Calcium: 9.1 mg/dL (ref 8.4–10.2)
Chloride: 105 mmol/L (ref 101–111)
Creatinine: 0.64 mg/dL — ABNORMAL LOW (ref 0.66–1.25)
EGFR: >60 mL/min/{1.73_m2}
EGFR: >60 mL/min/{1.73_m2}
Glucose: 100 mg/dL (ref 70–100)
Potassium: 3.3 mmol/L — ABNORMAL LOW (ref 3.6–5.0)
Sodium: 139 mmol/L (ref 135–145)

## 2009-09-11 LAB — CBC AND DIFFERENTIAL
BASOPHILS %: 0.3 % (ref 0.0–2.0)
BASOPHILS %: 0.2 % (ref 0.0–2.0)
Baso(Absolute): 0.04 10*3/uL (ref 0.00–0.20)
Baso(Absolute): 0.02 10*3/uL (ref 0.00–0.20)
Eosinophils %: 0.9 % (ref 0.0–6.0)
Eosinophils %: 0.5 % (ref 0.0–6.0)
Eosinophils Absolute: 0.12 10*3/uL (ref 0.10–0.30)
Eosinophils Absolute: 0.04 10*3/uL — ABNORMAL LOW (ref 0.10–0.30)
Hematocrit: 42.6 % (ref 39.0–49.0)
Hematocrit: 36.3 % — ABNORMAL LOW (ref 39.0–49.0)
Hemoglobin: 15.7 g/dL (ref 13.2–17.3)
Hemoglobin: 13 g/dL — ABNORMAL LOW (ref 13.2–17.3)
Immature Granulocytes #: 0.04 10*3/uL (ref 0.00–0.05)
Immature Granulocytes #: 0.02 10*3/uL (ref 0.00–0.05)
Immature Granulocytes %: 0.3 % — ABNORMAL HIGH (ref 0.0–0.0)
Immature Granulocytes %: 0.2 % — ABNORMAL HIGH (ref 0.0–0.0)
Lymphocytes Absolute: 4.14 10*3/uL (ref 1.00–4.80)
Lymphocytes Absolute: 1.66 10*3/uL (ref 1.00–4.80)
Lymphocytes Relative: 31.2 % (ref 25.0–55.0)
Lymphocytes Relative: 19.8 % — ABNORMAL LOW (ref 25.0–55.0)
MCH: 35.1 pg — ABNORMAL HIGH (ref 27.0–34.0)
MCH: 34.6 pg — ABNORMAL HIGH (ref 27.0–34.0)
MCHC: 36.9 g/dL — ABNORMAL HIGH (ref 32.0–36.0)
MCHC: 35.8 g/dL (ref 32.0–36.0)
MCV: 95.3 fL (ref 80–100)
MCV: 96.5 fL (ref 80–100)
MPV: 10.3 fL (ref 9.0–13.0)
MPV: 10.7 fL (ref 9.0–13.0)
Monocytes Absolute: 1.62 10*3/uL — ABNORMAL HIGH (ref 0.10–1.20)
Monocytes Absolute: 1 10*3/uL (ref 0.10–1.20)
Monocytes Relative %: 12.2 % — ABNORMAL HIGH (ref 1.0–8.0)
Monocytes Relative %: 11.9 % — ABNORMAL HIGH (ref 1.0–8.0)
Neutrophils Absolute: 7.35 10*3/uL (ref 1.80–7.70)
Neutrophils Absolute: 5.68 10*3/uL (ref 1.80–7.70)
Neutrophils Relative %: 55.4 % (ref 49.0–69.0)
Neutrophils Relative %: 67.6 % (ref 49.0–69.0)
Nucleated RBC %: 0 /100WBC (ref 0.0–0.0)
Nucleated RBC %: 0 /100WBC (ref 0.0–0.0)
Nucleted RBC #: 0 10*3/uL (ref 0.00–0.00)
Nucleted RBC #: 0 10*3/uL (ref 0.00–0.00)
Platelets: 211 10*3/uL (ref 150–400)
Platelets: 158 10*3/uL (ref 150–400)
RBC: 4.47 M/uL (ref 3.80–5.40)
RBC: 3.76 M/uL — ABNORMAL LOW (ref 3.80–5.40)
RDW: 13.5 % (ref 11.0–14.0)
RDW: 13.2 % (ref 11.0–14.0)
WBC: 13.27 10*3/uL — ABNORMAL HIGH (ref 4.80–10.80)
WBC: 8.4 10*3/uL (ref 4.80–10.80)

## 2009-09-11 LAB — BLOOD GAS ANALYSIS
Base Excess: 0.8 mmol/L (ref <=2.0)
HCO3, Arterial: 26 mmol/L (ref 22.0–28.0)
O2 Liter Flow: 2 LPM
O2 Sat(c): 98 % (ref 95.0–100.0)
Patient Temp: 98.6
pCO2: 43 mmHg (ref 35–45)
pH: 7.39 (ref 7.35–7.45)
pO2: 115 mmHg — ABNORMAL HIGH (ref 80–100)

## 2009-09-11 LAB — CBC
Hematocrit: 38 % — ABNORMAL LOW (ref 39.0–49.0)
Hemoglobin: 13.8 g/dL (ref 13.2–17.3)
MCH: 35.1 pg — ABNORMAL HIGH (ref 27.0–34.0)
MCHC: 36.3 g/dL — ABNORMAL HIGH (ref 32.0–36.0)
MCV: 96.7 fL (ref 80–100)
MPV: 10.2 fL (ref 9.0–13.0)
Nucleated RBC %: 0 /100WBC (ref 0.0–0.0)
Nucleted RBC #: 0 10*3/uL (ref 0.00–0.00)
Platelets: 175 10*3/uL (ref 150–400)
RBC: 3.93 M/uL (ref 3.80–5.40)
RDW: 13.6 % (ref 11.0–14.0)
WBC: 10.82 10*3/uL — ABNORMAL HIGH (ref 4.80–10.80)

## 2009-09-11 LAB — URINALYSIS
Bilirubin, UA: NEGATIVE
Blood, UA: NEGATIVE
Glucose, UA: NEGATIVE
Ketones UA: NEGATIVE
Leukocyte Esterase, UA: NEGATIVE
Nitrate: NEGATIVE
Protein, UR: NEGATIVE
Specific Gravity, UR: >1.035 — ABNORMAL HIGH (ref 1.000–1.035)
Urobilinogen, UA: NORMAL
pH, Urine: 6 (ref 5.0–8.0)

## 2009-09-11 LAB — PHOSPHORUS: Phosphorus: 4.5 mg/dL — ABNORMAL HIGH (ref 2.4–4.4)

## 2009-09-11 LAB — MAGNESIUM
Magnesium: 2.2 mg/dL (ref 1.7–2.2)
Magnesium: 1.9 mg/dL (ref 1.7–2.2)

## 2009-09-11 LAB — PT (PROTHROMBIN TIME)
PT INR: 1.2
PT: 13.2 s — ABNORMAL HIGH (ref 10.6–12.8)

## 2009-09-11 LAB — AMYLASE: Amylase: 53 U/L (ref 29–110)

## 2009-09-11 LAB — TSH, 3RD GENERATION: TSH, 3rd Generation: 5.18 mIU/L — ABNORMAL HIGH (ref 0.465–4.680)

## 2009-09-11 LAB — ALCOHOL, SERUM: Alcohol Screen Serum: 344 mg/dL — ABNORMAL HIGH (ref 0–50)

## 2009-09-11 LAB — LIPASE: Lipase: 152 U/L (ref 23–300)

## 2009-11-17 ENCOUNTER — Emergency Department
Admission: EM | Admit: 2009-11-17 | Disposition: A | Discharge status: Home / Self Care | Payer: Self-pay | Source: Emergency Department | Admitting: Emergency Medicine

## 2009-11-18 LAB — CBC AND DIFFERENTIAL
BASOPHILS %: 0.5 % (ref 0.0–2.0)
Baso(Absolute): 0.02 10*3/uL (ref 0.00–0.20)
Eosinophils %: 1.4 % (ref 0.0–6.0)
Eosinophils Absolute: 0.06 10*3/uL — ABNORMAL LOW (ref 0.10–0.30)
Hematocrit: 41.9 % (ref 39.0–49.0)
Hemoglobin: 15.5 g/dL (ref 13.2–17.3)
Immature Granulocytes #: 0.01 10*3/uL (ref 0.00–0.05)
Immature Granulocytes %: 0.2 % — ABNORMAL HIGH (ref 0.0–0.0)
Lymphocytes Absolute: 2.15 10*3/uL (ref 1.00–4.80)
Lymphocytes Relative: 51.2 % (ref 25.0–55.0)
MCH: 34.9 pg — ABNORMAL HIGH (ref 27.0–34.0)
MCHC: 37 g/dL — ABNORMAL HIGH (ref 32.0–36.0)
MCV: 94.4 fL (ref 80–100)
MPV: 10.6 fL (ref 9.0–13.0)
Monocytes Absolute: 0.65 10*3/uL (ref 0.10–1.20)
Monocytes Relative %: 15.5 % — ABNORMAL HIGH (ref 1.0–8.0)
Neutrophils Absolute: 1.32 10*3/uL — ABNORMAL LOW (ref 1.80–7.70)
Neutrophils Relative %: 31.4 % — ABNORMAL LOW (ref 49.0–69.0)
Nucleated RBC %: 0 /100WBC (ref 0.0–0.0)
Nucleted RBC #: 0 10*3/uL (ref 0.00–0.00)
Platelets: 147 10*3/uL — ABNORMAL LOW (ref 150–400)
RBC: 4.44 M/uL (ref 3.80–5.40)
RDW: 13 % (ref 11.0–14.0)
WBC: 4.2 10*3/uL — ABNORMAL LOW (ref 4.80–10.80)

## 2009-11-18 LAB — COMPREHENSIVE METABOLIC PANEL
ALT: 62 U/L — ABNORMAL HIGH (ref 7–56)
AST (SGOT): 135 U/L — ABNORMAL HIGH (ref 5–40)
Albumin, Synovial: 4.6 g/dL (ref 3.9–5.0)
Alkaline Phosphatase: 61 U/L (ref 38–126)
BUN / Creatinine Ratio: 12 (ref 8–20)
BUN: 9 mg/dL (ref 6–20)
Bilirubin, Total: 0.3 mg/dL (ref 0.2–1.3)
CO2: 24 mmol/L (ref 21.0–31.0)
Calcium: 9.4 mg/dL (ref 8.4–10.2)
Chloride: 105 mmol/L (ref 101–111)
Creatinine: 0.74 mg/dL (ref 0.66–1.25)
EGFR: >60 mL/min/{1.73_m2}
EGFR: >60 mL/min/{1.73_m2}
Glucose: 90 mg/dL (ref 70–100)
Potassium: 3.9 mmol/L (ref 3.6–5.0)
Protein, Total: 7.8 g/dL (ref 6.3–8.2)
Sodium: 147 mmol/L — ABNORMAL HIGH (ref 135–145)

## 2009-11-18 LAB — URINALYSIS
Bilirubin, UA: NEGATIVE
Blood, UA: NEGATIVE
Glucose, UA: NEGATIVE
Ketones UA: NEGATIVE
Leukocyte Esterase, UA: NEGATIVE
Nitrate: NEGATIVE
Protein, UR: NEGATIVE
Specific Gravity, UR: 1.015 (ref 1.000–1.035)
Urobilinogen, UA: NORMAL
pH, Urine: 6.5 (ref 5.0–8.0)

## 2009-11-18 LAB — PTT (PARTIAL THROMBOPLASTIN TIME)
PTT Ratio: 0.9 (ref 0.8–1.2)
PTT: 26.4 s (ref 21.6–34.0)

## 2009-11-18 LAB — TROPONIN I: Troponin I: <0.01 ng/mL (ref 0.000–0.034)

## 2009-11-18 LAB — LIPASE: Lipase: 221 U/L (ref 23–300)

## 2009-11-18 LAB — PT (PROTHROMBIN TIME)
PT INR: 1.2
PT: 12.8 s (ref 10.6–12.8)

## 2009-11-18 LAB — ALCOHOL, SERUM: Alcohol Screen Serum: 353 mg/dL — ABNORMAL HIGH (ref 0–50)

## 2009-11-18 LAB — MAGNESIUM: Magnesium: 2 mg/dL (ref 1.7–2.2)

## 2009-11-18 LAB — AMYLASE: Amylase: 67 U/L (ref 29–110)

## 2009-11-18 LAB — MYOGLOBIN, SERUM: Myoglobin: 16 ng/mL (ref 0–121)

## 2009-11-18 LAB — CK: Creatine Kinase (CK): 81 U/L (ref 19–216)

## 2010-10-23 ENCOUNTER — Emergency Department
Admission: EM | Admit: 2010-10-23 | Disposition: A | Discharge status: Home / Self Care | Payer: Self-pay | Source: Ambulatory Visit

## 2011-06-22 NOTE — H&P (Signed)
Gregory [name], Gregory [name]                                                    MRN:          [id number]                                                          Account:      [id number]                                                     Document ID:  [id number]                                                                                                                                   MRN: [id number]  Admit Date: 02/07/2009     Patient Location: [id number]   Patient Type: I     ATTENDING PHYSICIAN: Candy Sledge, MD        CHIEF COMPLAINT:    Epigastric abdominal pain today.     HISTORY OF PRESENT ILLNESS:  The patient is a 35 year old white male with a history of pancreatitis and  alcohol abuse who presents with the sudden onset of epigastric pain.  He  presented to the [facility] ER, and his lipase  was 809, consistent with acute pancreatitis.  He was transferred to the  [facility] facility for admission.  Currently, he is very  somnolent; unable to elicit any pertinent history from the patient.   Information was obtained from the ER record mainly.     ALLERGIES:  PENICILLIN.     MEDICATIONS:  None.     PAST MEDICAL HISTORY:  Recurrent pancreatitis, thyroid storm.  He was admitted to [facility] for pancreatitis in November 2009, as well as April 2010.  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  Bipolar disorder.     PAST SURGICAL HISTORY:  None.     SOCIAL HISTORY:  Denies drug abuse.  He smokes 1 pack per day for 5 years.  He consumes  alcohol daily, 1 to 2 beers.     FAMILY HISTORY:  Includes coronary artery disease and hypertension.     REVIEW OF SYSTEMS:  As above in history of present illness.  Page 1 of 3  Gregory [name], Gregory [name]                                                    MRN:          [id number]                                                          Account:      [id number]                                                      Document ID:  [id number]                                                                                                                                      PHYSICAL EXAMINATION:  VITAL SIGNS:  Blood pressure 1161/108, pulse 96, respiratory rate 24,  temperature 97.7, oxygen saturation rate on room air 96%.  GENERAL:  A well-nourished, well-developed, young white male in no acute  distress.  Very somnolent when he is awake and complaining of pain.  HEENT:  Normocephalic, atraumatic.  EOMI.  PERRLA.  Sclerae anicteric.   Oropharynx clear.  NECK:  No JVD, bruits, lymphadenopathy, or thyromegaly.  HEART:  Regular rate and rhythm, normal S1, S2.  No murmurs, rubs or  gallops.  LUNGS:  Clear to auscultation bilaterally.  No rhonchi, wheezing, or rales.  ABDOMEN:  Soft.  Bowel sounds are present.  Exquisitely tender in the  epigastric and left upper quadrant, right upper quadrant; mildly tender in  the lower quadrant with guarding but no rebound tenderness.  EXTREMITIES:  No edema, cyanosis or clubbing.  Pulses are 2+ in dorsalis  pedis and posterior tibial bilaterally.  NEUROLOGIC:  Cranial nerves II-XII are grossly intact.  No focal findings.  SKIN:  Warm and dry.     LABORATORY DATA:  WBC 11.29, H and H 14 and 38.8, platelets 163.  Sodium 138, potassium 3.8,  chloride 105, bicarbonate 22, glucose 78, BUN 11, creatinine 0.84, calcium  9.7, bilirubin 0.6, protein 7.7, albumin 4.5, alkaline phosphatase 80, AST  73, ALT 56.  Alcohol level 48.  PT 12.3, INR 1.1, PTT 30.1.  CT abdomen and  pelvis with contrast showed moderately- severe acute pancreatitis.  Chest  x-ray showed no acute disease.  EKG shows sinus rhythm with frequent PVCs  at the ventricular rate of  89 without any acute ST or T-wave abnormalities,  otherwise.     ASSESSMENT AND PLAN:  1.  Acute pancreatitis.  2.  History of alcohol abuse.  3.  History of thyroid storm.     Will admit patient to  noncardiac telemetry unit as full inpatient status.   He will be n.p.o.  Will start IV fluid and give supportive care.  For the  first liter of IV fluid, will add folate 1 mg, thiamine 100 mg, and  multivitamin 1 vial.  For GI prophylaxis, will give Protonix 40 mg IV  daily.  For DVT prophylaxis, will give Lovenox 40 mg subcutaneously daily.   Will give Phenergan 12.5 mg IV every 6 hours p.r.n. nausea, Dilaudid 1 mg  IV every 4 hours p.r.n. pain, and Ativan 1 mg IV every 4 hours p.r.n.  anxiety/withdrawal.                                                                                                                 Page 2 of 3  Gregory [name], Gregory [name]                                                    MRN:          [id number]                                                          Account:      [id number]                                                     Document ID:  [id number]                                                                                                                                         D:  02/08/2009 02:04 AM by Gardiner Sleeper, MD [phone])  T:  02/08/2009  08:58 AM by AH        cc:                                                                                                            Page 3 of 3  Authenticated by Candy Sledge, MD On 02/08/2009 06:10:01 PM

## 2011-06-22 NOTE — H&P (Signed)
Gregory [name], Gregory [name]                                                    MRN:          [id number]                                                          Account:      [id number]                                                     Document ID:  [id number]                                                                                                                                   MRN: [id number]  Admit Date: 09/07/2009     Patient Location: [id number]   Patient Type: I     ATTENDING PHYSICIAN: DIANE TRAFICANTE, DO        HISTORY OF PRESENT ILLNESS:  The patient is a 35 year old male who presented to the hospital emergency  room with complaints of worsening abdominal pain, which had started several  hours before.  It was associated with some nausea and vomiting.  He stated  at that time the pain was sharp, mostly in the epigastric area and with  some radiation to the back as well as the left lower quadrant.  He  complained of no associated fevers, chills, or any other systemic  complaints.  He had had several episodes of vomiting but had not vomited in  large quantities.  He denies any similar complaints in the past.  In the  emergency room, he received several doses of hydromorphone for control of  his pain which led to mild respiratory depression and needing ICU  management for further evaluation.  He was transferred to the ICU for  management of the same.  This morning he is arouseable, mildly drowsy.  He  is oriented x3.  He is able to follow all commands and states that he feels  better.  He does continue to complain of mild abdominal pain, but this is  significantly improved since yesterday.  The patient had had significant  amounts of alcohol prior to admission.       PAST MEDICAL HISTORY:  Significant pain, is not significant for any major illnesses in the past.   Acute alcohol intoxication.     PAST SURGICAL  HISTORY:  Significant for no major surgeries.     ALLERGIES:  PENICILLIN.  Ashby Dawes of allergy is unknown.      REVIEW OF SYSTEMS:  Not obtained at this time.     PHYSICAL EXAMINATION:  GENERAL:  The patient is a pleasant 35 year old white male who appears in  no acute distress.  VITAL SIGNS:  Temperature 98, pulse of 82, respiratory rate 12, blood  pressure 110/76, and saturations of 96% on 2 liters nasal cannula.                                                                                                           Page 1 of 3  Gregory [name], Gregory [name]                                                    MRN:          [id number]                                                          Account:      [id number]                                                     Document ID:  [id number]                                                                                                                                   HEENT:  Reveals pupils are equal and reactive to light bilaterally.   Extraocular muscles intact.  Oropharyngeal:  Reveals no changes.  NECK:  Reveals flat neck veins.  CHEST:  Reveals clear breath sounds anteriorly and posteriorly.  HEART:  Reveals S1, S2, without murmurs, rubs or gallops.  ABDOMEN:  Soft, mild epigastric tenderness is present.  There is no right  lower quadrant tenderness.  Vague left lower quadrant tenderness is present  which is without guarding or rebound.  Bowel sounds are well heard.  LABORATORY DATA:  Available includes ABG this morning which reveals a pH of 7.39, pCO2 43,  pO2 115 and bicarbonate 26.  This was drawn on 2 liters nasal cannula  oxygen, saturations at 98%.  Chest x-ray shows no acute process.  White  count is 10, hemoglobin of 13, hematocrit of 38, platelet count 175.   Urinalysis reveals no signs of infection.  Drugs of abuse screen was  negative except for opiates, but this was after he received IV narcotic  medication in the ER.  Blood urea nitrogen is at 8.  Sodium 142, potassium  3.9, chloride 108, CO2 23, glucose of 55, creatinine 0.7, calcium 8.3, AST  and ALT which are  within normal limits.  phosphorus of 4.5.  TSH of 5.18.     CT of the abdomen reveals prior pancreatitis which appears to have  resolved, appendix remains prominent with a question of inflammatory  changes in the same.     ASSESSMENT:  1.  Acute abdominal pain, possible pancreatitis, possible appendicitis.  2.  Leukocytosis.  3.  Respiratory depression from narcotic administration.  4.  Nausea.  5.  Vomiting.     PLAN:  1.  Continue IV medications.  Keep n.p.o.   2.  Discussed with the surgical attending.  She does not feel the need for  urgent surgical intervention at this time as this is unlikely to be acute  appendicitis.  Will start on empiric levofloxacin for antibiotic coverage.  3.  Will continue to monitor abdominal pain.  Keep n.p.o.  Continue IV  fluids as needed.  4.  Continue multivitamin and IV fluid infusion.  Continue n.p.o. and DVT  prophylaxis.  5.  If abdominal pain worsens, plan for repeat CT scan of the abdomen and  surgical followup at that time.  6.  Further plans once the above are available.     Thank you for allowing me to participate in the care of the patient.                                                                                                           Page 2 of 3  Gregory [name], Gregory [name]                                                    MRN:          [id number]                                                          Account:      [id number]  Document ID:  [id number]                                                                                                                                      Critical care time involved in the evaluation and management of this  patient was 45 minutes.  This does not include procedural time.              D:  09/08/2009 11:51 AM by Leta Baptist, MD ([id number])  T:  09/08/2009 12:58 PM by AZ        cc:                                                                                                             Page 3 of 3  Authenticated by Leta Baptist, M.D. On 09/08/2009 03:53:27 PM

## 2011-06-22 NOTE — Discharge Summary (Signed)
Gregory [name], Gregory [name]                                                    MRN:          [id number]                                                          Account:      [id number]                                                     Document ID:  [id number] [phone]                                                                                                                                   MRN: [id number]  Admit Date: 07/26/2008  Discharge Date: 07/31/2008     ATTENDING PHYSICIAN:  Dorann Ou, MD        HISTORY OF PRESENT ILLNESS:   This is a 35 year old gentleman with known history of ETOH who presents  with onset of abdominal pain this morning.  He reports he has not consumed  alcohol in a few days prior to this discharge.  In the ER, CT of the  abdomen and pelvis with contrast revealed fatty infiltrates of the liver  and acute pancreatitis without pseudocyst or abscess.  His white count was  12,000.  His AST and ALT were elevated.  He was admitted for further  evaluation and management.       HOSPITAL COURSE:   During the hospital course the patient had a fever of 101.  Blood cultures  were obtained and U/A. These were all negative at time of this dictation.   Repeat CT revealed improving pancreatitis without pseudocyst.  His enzymes  have continued to trend down.  He is tolerating food without difficulty.   Lastly, the patient was placed on the ASE protocol due to daily alcohol  consumption.       Dr. Susy Frizzle, ID, was consulted on this patient who is comfortable with plan  of care and discharge.       DISCHARGE DIAGNOSIS:   Resolved pancreatitis.   Increased alcohol consumption.   Improved, elevated LFTs.   Diarrhea, resolved.   Resolved leukocytosis.      DISCHARGE MEDICATIONS:   See attached MAR.      DISCHARGE PLAN:   Follow up with his primary care physician, Dr.  Delphia Grates for follow up on  his LFTs as well as he was encouraged to attend AA meetings and outpatient  psychiatric evaluation.   He will continue on  his Seroquel for his bipolar.  Will complete Levaquin,  Flagyl, and lactonase.  Follow up in two weeks with Dr. Susy Frizzle.                                                                                                                 Page 1 of 2  Gregory [name], Gregory [name]                                                    MRN:          [id number]                                                          Account:      [id number]                                                     Document ID:  [id number]                                                                                                                                         D:  07/31/2008 09:25 AM by Doralee Albino. SHER, NP ([id number])  T:  07/31/2008 22:48 PM by RB           cc: Majel Homer MD   Delphia Grates MD   Lambert Mody MD  Page 2 of 2  Authenticated by Majel Homer, MD On 08/01/2008 03:20:15 PM   Authenticated by Doralee Albino. SHER, N.P. On 08/05/2008 11:43:39 AM

## 2011-06-22 NOTE — Consults (Signed)
Gregory [name], Gregory [name]                                                    MRN:          [id number]                                                          Account:      [id number]                                                     Document ID:  [id number]                                               Service Date: 02/09/2009                                                                                    MRN: [id number]  Admit Date: 02/07/2009     Patient Location: [id number]   Patient Type: I     CONSULTING PHYSICIAN: Lambert Mody MD     REFERRING PHYSICIAN: Delle Reining NP        REASON FOR CONSULTATION:  Abdominal pain, pancreatitis, fever, leukocytosis. Gregory [name]     HISTORY OF PRESENT ILLNESS:  This is a 35 year old gentleman known to me from an admission in November  with a history of pancreatitis and alcohol abuse who was admitted with  sudden onset of severe epigastric pain.  He had a CT of the abdomen and  pelvis with contrast which showed moderately severe acute pancreatitis and  a lipase, which was 9809.  He had AST of 73, ALT 56 and a leukocytosis of  11,000 which increased to 16,000 on the May 23.  He was started on IV  fluids, made n.p.o. and his nausea has improved.  He has not vomited since  yesterday.  He was also started on Tygacil in view of his allergy to  PENICILLIN.  He feels better today, but he does complain of persistent  abdominal pain.  The epigastric pain has improved somewhat but the pain is  now periumbilical and lower abdominal and the pain worsens with any  movement.  He had a formed bowel movement yesterday, but this morning he  had a loose bowel movement.  No blood, no mucus.  Denies any dysuria or  urinary frequency.  Denies any cough, chest pain, or shortness of breath.   Denies any fevers or chills at home.  Denies anorexia.  He does report that  he had  cut down on his alcohol use over the last few months, but 2 days  prior to admission, the patient reports drinking 12 beers in 2 days  as  opposed to the 2 beers a day he was drinking prior to that.     REVIEW OF SYSTEMS:  As per history of present illness.  All other review of systems negative.     PAST MEDICAL HISTORY:  Significant for pancreatitis, recurrent.  He was admitted at [facility] with pancreatitis in November, as well as in April.  He has had a  history of bipolar disorder and thyroid storm.     MEDICATIONS:  As an outpatient, he is on no medications.  In the hospital, he is on  Tygacil 500 mg IV q.12  h., p.r.n. lorazepam, and pain medicines,  as well                                                                                                           Page 1 of 3  Gregory, [name]                                                    MRN:          [id number]                                                          Account:      [id number]                                                     Document ID:  [id number]                                               Service Date: 02/09/2009                                                                                    as Protonix.     ALLERGIES:  PENICILLIN, gave some nausea, vomiting, and hives, per patient.     PAST SURGICAL HISTORY:  None.  SOCIAL HISTORY:  Lives with his fiancee.  He has a 15-pack-year tobacco history, but has now  come down to smoking 5 cigarettes a day.  He has a history of alcohol  abuse, drinking up to 12 beers a day in the past, but in the last few  months he has come down to 1 to 2 beers per day.  Denies any recreational  drug use.  Works in [employer] nurse.     FAMILY HISTORY:  Significant for hypertension, diabetes in grandmother.  Heart attack in  great-grandmother and hyperlipidemia in the family.     PHYSICAL EXAMINATION:  VITAL SIGNS:  The patient has a T-max of 101.3. T-current is 99.1.  Heart  rate is 91, blood pressure 149/71, saturating 93% on room air.  GENERAL:  Well-developed gentleman, in no acute distress except for  when he  moves; then he winces due to abdominal pain.  He is awake and alert.  HEENT:  Normocephalic, atraumatic.  EOMI.  PERRLA.  Sclerae anicteric.   Conjunctivae pink.  Mucous membranes moist.  Oropharynx clear.  NECK:  Supple, no adenopathy, no thyromegaly.  CHEST:  Minimal crackles, left base, otherwise clear to auscultation.  CVS:  S1, S2, regular rate and rhythm.  ABDOMEN:  Soft, moderate tenderness to palpation in epigastric area and  periumbilical area.  There is voluntary guarding.  There is rebound.   Normoactive bowel sounds.  EXTREMITIES:  No clubbing, cyanosis, or edema.  Distal pulses 2+.  NEUROLOGIC:  Alert and oriented x3.  Grossly nonfocal.  SKIN:  No rashes.  JOINTS :  Benign .     LABORATORY DATA:  Blood cultures pending.  CBC with differential today shows a white count of  10.44;  H and H of 13.7 and 38.7; MCV 101, MCH 35.8; platelets 110;  neutrophils 82%.  Amylase 313.  CMP significant for potassium of 3.2,  creatinine 0.71, calcium 7.7, albumin 3.1, total bilirubin 0.4, alkaline  phosphatase 64, AST 45, ALT 34 which is an improvement from admission.   Lipase is 1265.  Urinalysis is negative.  Urine culture:  No growth  preliminary.  Yesterday patient's white count was 16.28 with 91%                                                                                                           Page 2 of 3  Gregory, [name]                                                    MRN:          [id number]  Account:      [id number]                                                     Document ID:  [id number]                                               Service Date: 02/09/2009                                                                                    neutrophils and 6% bands and lipase was 3153.  CT of the abdomen and pelvis  with contrast on admission showed moderately severe acute pancreatitis and  a chest x-ray done on admission showed no  acute disease in the chest.     IMPRESSION:  Acute pancreatitis secondary to alcohol abuse/binge drinking.  Fever,  abdominal pain, leukocytosis secondary to that.  He may also have some  peritonitis, as he has peritoneal signs.  He is on empiric Tygacil. History  of alcohol abuse, history of thyroid storm, history of tobacco abuse,  history of bipolar disorder.       RECOMMENDATIONS:    At this time would recommend supportive care, n.p.o., IV fluids,  delirium  tremens precautions and multivitamin and IV fluids.  Continue Tygacil  empirically.  Follow up blood cultures.     Thank you for allowing me to participate in the care of this pleasant  patient.  I will follow with you.              D:  02/09/2009 10:26 AM by Lambert Mody, MD [phone])  T:  02/09/2009 15:41 PM by MD        cc: Dimas Aguas NP                                                                                                           Page 3 of 3  Authenticated by Lambert Mody, M.D. On 02/11/2009 09:49:35 AM

## 2011-06-22 NOTE — Consults (Signed)
Gregory [name], Gregory [name]                                                    MRN:          [id number]                                                          Account:      [id number]                                                     Document ID:  [id number]                                               Service Date: 07/29/2008                                                                                    MRN: [id number]  Admit Date: 07/26/2008     Patient Location: [id number]   Patient Type: I     CONSULTING PHYSICIAN:      REFERRING PHYSICIAN:         REASON FOR CONSULTATION:  Persistent leukocytosis, fevers, diarrhea.      HISTORY OF PRESENT ILLNESS:  This is a 35 year old gentleman with a history of heavy alcohol use,  bipolar disorder and possible remote history of pancreatitis who was  admitted on July 26, 2008 with acute pancreatitis and no abscess of  pseudocyst on CT. He has been on supportive care and on IV Levaquin. His  abdominal pain has improved as are his parameters. This was thought to be  likely related to alcohol abuse. He also, during admission, developed  alcohol withdrawal but is sedated and his blood pressure, heart rate are  under control. He had leukocytosis 12.3 on admission with mild left shift  and bandemia and that has persisted and he has also been having low grade  temperatures over the last two days. This morning he had diarrhea in the  setting of antibiotics. He is also noted to have transaminitis which were  thought to be alcohol related hepatitis and his LFTs are improving. The  patient reports that he was last tested for HIV four years ago prior to his  son being born and denies any risk factors for HIV since then.      PAST MEDICAL HISTORY:  Bipolar disorder for which he takes Seroquel for the last three to four  years. He states it works well. Possible remote history of pancreatitis  many years ago.  PAST SURGICAL HISTORY:   Vasectomy.      ALLERGIES:  PENICILLIN. ACCORDING TO  THE PATIENT HE HAS NAUSEA AND VOMITING WITH  PENICILLINS BUT ACCORDING TO HIS H AND P THE PATIENT HAS HAD HIVES WITH  PENICILLIN.     PATIENT MEDICATIONS:  Seroquel 400 mg p.o. q.h.s. Here he has been on chlordiazepoxide,                                                                                                           Page 1 of 3  Gregory [name], Gregory [name]                                                    MRN:          [id number]                                                          Account:      [id number]                                                     Document ID:  [id number]                                               Service Date: 07/29/2008                                                                                    haloperidol, levofloxacin 500 mg IV q. 24 hours and lorazepam. He has also  been on metoprolol and nicotine patch, Protonix, Seroquel, multivitamin,  folic acid, thiamine, and pain medications.      REVIEW OF SYSTEMS:  As per HPI. All other review of systems negative.      FAMILY HISTORY:  Significant for hypertension and strokes.      SOCIAL HISTORY:  He is a Nutritional therapist by trait. Drinks about a case of beer a day. Admits to  smoking half to one pack per day for the last five years. Denies any  recreational drug use.      PHYSICAL EXAMINATION:  VITAL SIGNS: T-max 99.1, T-current 97.2, heart rate 91, respiratory rate  18, blood pressure 111/69, sating at 98% on room air.  GENERAL: Well-developed, well-nourished young man sedated but arouseable,  answering questions, moving all extremities.   HEENT: Normocephalic, atraumatic. EOMI. PERRLA. Sclera anicteric.  Conjunctivae pink. Mucous membranes moist.   NECK: Supple. No adenopathy.   CHEST: CTA bilaterally.  CVS: S1, S2 regular rate and rhythm.  ABDOMEN: Soft, mildly distended. No epigastric tenderness.   EXTREMITIES: Patient does have left lower quadrant tenderness to palpation.  No rebound or guarding. No inguinal or axillary adenopathy.  Joints benign.   SKIN: He has a scaly rash around his left wrist, some on his forearm and  also on the dorsum of his feet. The rash on the wrist, the patient reports  is secondary to the watch he wears. He has never been diagnosed with  psoriasis.      LABORATORY DATA:  CBC shows a WBC 12.9; H and H 12.2 and 33.9; MCV 102.8; MCH 37; platelets  128,000 with 78% granulocytes; 1% bands; 16% monocytes. Lipase 466. Free T4  1.0. BUN yesterday was 8; sodium 131; potassium 3.6; chloride 100; CO2 23;  glucose 67; creatinine 0.81; calcium 8.4; total bili 1.5; albumin 3.4;  alkaline phosphatase 74; AST 73; ALT 60.  Lipase on July 27, 2008 was  1222; TSH 10.7; triglycerides 295. Blood cultures are no growth at 48  hours. Abdominal ultrasound done on July 26, 2008 showed normal  abdominal ultrasound. CT of the abdomen and pelvis with contrast done  July 26, 2008 showed acute pancreatitis without pseudocyst or abscess,  fatty infiltration of the liver and partially visualized _____ on the left  lower lobe. Chest x-ray was negative on admission. AST and ALT on admission                                                                                                           Page 2 of 3  Gregory [name], Gregory [name]                                                    MRN:          [id number]                                                          Account:      [id number]                                                     Document ID:  [id number]                                               Service Date: 07/29/2008                                                                                    with 350 and 165.      IMPRESSION:   This 35 year old gentleman with heavy alcohol use, bipolar disorder  admitted with acute pancreatitis improving on supportive care and Levaquin.  Likely alcohol related. Undergoing alcohol withdrawal but is sedated and  heart rate and blood pressure are under control. Persistent leukocytosis  and low  grade temperature. Rule out pseudocyst abscess. He is also having  diarrhea in the setting of antibiotics. Rule out C-Diff.  There is also  tenderness to palpation left lower quadrant. His transaminitis is likely  alcoholic hepatitis which is improving.      RECOMMENDATION:  At this point would like to recommend stool for C-Diff x3 and stool for  WBCs. Start empiric Flagyl 500 mg p.o. t.i.d. This would empirically treat  C-Diff and improve abdominal coverage.  Reimage abdomen. Continue Levaquin  but if he spikes we can consider changing to Tygacil. It is unclear exactly  what penicillin allergy is because there is a conflicting history. Follow  up blood cultures.      Thank you for allowing me to participate in the care of this pleasant  patient. Will follow closely with you.              D:  07/29/2008 10:07 AM by Lambert Mody, MD [phone])  T:  07/29/2008 16:56 PM by LS        cc: Majel Homer MD                                                                                                           Page 3 of 3  Authenticated by Lambert Mody, M.D. On 07/31/2008 09:17:01 AM

## 2011-06-22 NOTE — Discharge Summary (Signed)
Gregory [name], Gregory [name]                                                    MRN:          [id number]                                                          Account:      [id number]                                                     Document ID:  [id number]                                                                                                                                   MRN: [id number]  Admit Date: 09/07/2009  Discharge Date: 09/10/2009     ATTENDING PHYSICIAN:  DIANE TRAFICANTE, DO        DISCHARGE DIAGNOSES:  1.  Appendicitis, status post appendectomy.  2.  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  3.  History of acute alcoholic intoxication.  4.  History of bipolar disorder.    5.  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     BRIEF HISTORY AND HOSPITAL COURSE:  The patient is a 35 year old gentleman who presented to the ER with  complaints of severe left lower quadrant abdominal pain, nausea, vomiting.   The patient is requesting narcotics for his pain control was found to be  hypoxic requiring Narcan and exhibited signs of alcohol withdrawal for  which he was subsequently transferred to intensive care unit for further  monitoring. He did not require intubation.  The patient was consulted on by  Dr. Mallie Mussel for the same, in view of his pancreatitis, but his  amylase and lipase were within normal limits.  CT of the abdomen was done,  which showed a prominent appendix with associated inflammatory changes for  which patient was consulted on by Dr. Corrie Mckusick from general surgery.  He underwent laparoscopic appendectomy.  The patient was stabilized and was  subsequently transferred out of the unit and post-appendectomy patient has  remained stable postoperative period was uneventful.  The patient has been  tolerating p.o. diet without any further symptomatology.  The patient is  being discharged on p.o. antibiotics.  He is advised to refrain from  alcohol.  Diet  regular.     ACTIVITY:  As tolerated.     FOLLOWUP:  Follow up with Dr. Corrie Mckusick as an outpatient.  Follow up with PCP in  1 to 2 weeks.              D:  09/10/2009 16:08 PM by Harrell Gave, MD [phone])  T:  09/18/2009 12:33 PM by LS                                                                                                              Page 1 of 2  [name], [name]                                                    MRN:          [id number]                                                          Account:      [id number]                                                     Document ID:  [id number]                                                                                                                                         cc: CHRISTINE HABIB MD   Delphia Grates MD  Page 2 of 2  Authenticated by Harrell Gave, M.D. On 09/24/2009 09:41:18 PM

## 2011-06-22 NOTE — Consults (Signed)
Gregory [name], Gregory [name]                                                    MRN:          [id number]                                                          Account:      [id number]                                                     Document ID:  [id number]                                               Service Date: 09/08/2009                                                                                    MRN: [id number]  Admit Date: 09/07/2009     Patient Location: [id number]   Patient Type: I     CONSULTING PHYSICIAN: CHRISTINE M HABIB MD     REFERRING PHYSICIAN: DIANE L TRAFICANTE DO        REASON FOR CONSULTATION:  Abdominal pain.     HISTORY OF  HISTORY OF PRESENT ILLNESS:  This is a 35 year old man with significant past medical history of  recurrent pancreatitis related to alcoholism.  The patient presented to the  emergency department overnight with severe left lower quadrant abdominal  pain, nausea, and vomiting.  The patient upon admission was inebriated with  a significantly elevated alcohol level.  While on the hospital floor, he  was found to be hypoxic with a very low respiratory rate.  He was  subsequently given Narcan and exhibited signs of alcohol withdrawal.  He  was immediately transferred to the intensive care unit.  This morning,  during my interview of the patient, he is awake.  He is alert.  He admits  to the alcoholic binge that he had yesterday and states that his abdominal  pain, nausea and vomiting occurred during this binge of alcohol.  He  recalls many prior similar episodes of left lower quadrant abdominal pain  and states that this is no different.     PAST MEDICAL HISTORY:  Alcoholism and bipolar disorder.  He has recurrent pancreatitis related to  alcohol abuse.      PAST SURGICAL HISTORY:  Vasectomy.     MEDICATIONS:  Seroquel.     ALLERGIES:  PENICILLIN causing hives.     SOCIAL HISTORY:  The alcohol  abuse as stated above.                                                                                                               Page 1 of 2  Gregory [name], Gregory [name]                                                    MRN:          [id number]                                                          Account:      [id number]                                                     Document ID:  [id number]                                               Service Date: 09/08/2009                                                                                    PHYSICAL EXAMINATION:  VITAL SIGNS:  Normal.  He is afebrile.  GENERAL:  Appears well-developed, nourished, in no acute distress.  HEART:  Regular.  LUNGS:  Clear.  ABDOMEN:  Soft.  He is tender in the left lower quadrant and slightly in  the right lower quadrant with no rebound, no guarding.  NEUROLOGIC:  Grossly intact.  EXTREMITIES:  No pedal edema.     LABORATORY DATA:  Include a CBC with a white blood cell of 10.8 thousand.  Urinalysis is  normal.  He has a normal bilirubin.  AST and ALT are both slightly  elevated.  He has a toxicology screen that is positive for opiates,  although I am unsure if this toxicology screen was done before or after he  was given Dilaudid in the hospital.  The patient has a lipase which is  normal at 152 and an alcohol level elevated at 344.  The patient also had a  CT scan of the abdomen and pelvis with IV and oral contrast which was  compared to a prior similar CT done on Feb 10, 2009.  The appendix appears  prominent measuring 13 mm in diameter, which is similar to his prior CT  scan.  There is mild adjacent inflammatory change, but no evidence of gross  perforation or abscess.     ASSESSMENT AND PLAN:  Abdominal pain, nausea, and vomiting during alcoholic binge.  At this time,  he does not have a clinical picture consistent with recurrent pancreatitis.   His appendix was prominent on the CT scan in May as it is on the CT scan  done yesterday.  However, he is not tender at McBurney's point and does not  have a typical clinical  picture of appendicitis.  I recommend observation  throughout the day today.  If his symptoms progress or if he becomes more  tender on examination, I would recommend repeating the CT scan tonight and  if the findings are consistent with worsening inflammatory changes about  the appendix, I would then recommend laparoscopic appendectomy.  In the  meantime, he will be maintained in the ICU and probably transferred to the  regular floor today.              D:  09/08/2009 10:50 AM by Marolyn Haller, MD ([id number])  T:  09/08/2009 15:26 PM by MD        cc: Delphia Grates MD                                                                                                           Page 2 of 2  Authenticated by Marolyn Haller, MD On 09/08/2009 04:45:57 PM

## 2011-06-22 NOTE — H&P (Signed)
Gregory [name], Gregory [name]                                                    MRN:          [id number]                                                          Account:      [id number]                                                     Document ID:  [id number]                                                                                                                                   MRN: [id number]  Admit Date: 01/12/2009     Patient Location: [id number]   Patient Type: I     ATTENDING PHYSICIAN: DIANE TRAFICANTE, DO        CHIEF COMPLAINT:  Chest and abdominal pain.     HISTORY OF PRESENT ILLNESS:  This 35 year old male complains of an acute onset about 1 hour prior to  arrival to the emergency department of diffuse, mostly substernal chest  pressure/pain, heart racing, tremulousness, and sensation of faintness. He had   a couple episodes of nausea and vomiting, but no hematemesis. He was admitted   to our facility July 26, 2008 to July 31, 2008 with alcoholic   pancreatitis without pseudocyst. He downplays his alcohol consumption, but has   a history of drinking 5 to 7 beers a night and more heavy on the weekends. His   blood alcohol level in the emergency department was 31. He denies recent   URI/ENT/sinus symptoms, fever, chills, sweats, diarrhea, constipation, UTI   symptoms, melena, hematochezia, hematemesis, arthralgias, myalgias, headache,   vertigo, tinnitus, or rash. He took pseudoephedrine this morning, but denies   symptoms for this.     PAST MEDICAL HISTORY:  Chronic active alcohol abuse.    Alcoholic pancreatitis.    "Thyroid storm" but not on medications.   Bipolar disorder.     PAST SURGICAL HISTORY:  Vasectomy.     ALLERGIES:  PENICILLIN (hives).     CURRENT MEDICATIONS:  Seroquel 400 mg every evening.     SOCIAL HISTORY:  One pack per day tobacco use. Chronic active alcohol abuse as noted above.  No drug use.     FAMILY  HISTORY:  Negative for early CAD, CVA, or VTE.                                                                                                            Page 1 of 3  Gregory [name], Gregory [name]                                                    MRN:          [id number]                                                          Account:      [id number]                                                     Document ID:  [id number]                                                                                                                                      REVIEW OF SYSTEMS:  As noted above, otherwise negative.     PHYSICAL EXAMINATION:  VITAL SIGNS: Blood pressure 150/74, initial pulse rate 148, now 72 after IV   fluids, respirations 20, temperature 98.8. Room air saturation 96 to 98%.  GENERAL:  Well-developed, well-nourished, nontoxic, somewhat anxious male  in no acute distress at this time.  HEENT: PERRLA. EOMI. Sclerae anicteric. Conjunctivae clear. Face is flushed.  NECK:  Supple, nontender. Full range of motion. No lymphadenopathy or nuchal   rigidity.  HEART:  Regular rate and rhythm.  S1, S2.  No murmur, gallop or rub.  CHEST:  Nontender to palpation or compression.  LUNGS:  Clear to auscultation.  Nonlabored respirations.  ABDOMEN: Soft, nondistended, mild epigastric tenderness without rebound,  rigidity or guarding. No other abdominal tenderness. Normoactive bowel sounds.  BACK:  No CVA or spinal tenderness.  EXTREMITIES:  No clubbing, cyanosis, or edema.  Normal muscle strength with  increased tone.  He is tremulous.  NEURO:  Alert, oriented x3.  Cranial nerves intact.  SKIN: Warm, dry. Mucous membranes still somewhat dry, adequate turgor, no rash.  PSYCHIATRIC: Poor eye contact. Flat affect. Depressed mood. Well-groomed.       DIAGNOSTIC STUDIES:  WBC 6.34 with normal differential and macrocytic RBC indices. Hemoglobin 13.5;   hematocrit 37.9; platelet count 150,000. Sodium 140, potassium 2.8, chloride   101, CO2 26, glucose 84, BUN 6, creatinine 0.79.  AST 170, ALT 100. Otherwise,   LFTs, CK,  lipase, magnesium, troponin, myoglobin normal. D-dimer negative at   221. TSH elevated 9.730 with normal free T4 of 0.87. Acetaminophen, salicylate   levels negative.     Urinalysis: Yellow/clear, SG 1.016, pH 7.0. Otherwise, negative dipstick.     Chest x-ray:  No active pulmonary disease.     EKG: Sinus tachycardia at 109 b.p.m. Normal axis and intervals. Normal EKG   except for rate. No comparison EKG.                                                                                                                Page 2 of 3  Gregory [name], Gregory [name]                                                    MRN:          [id number]                                                          Account:      [id number]                                                     Document ID:  [id number]                                                                                                                                   ASSESSMENT  AND PLAN:    1.  Atypical chest pain.        This is likely due to chronic active alcohol abuse/withdrawal symptoms.       Telemetry admission with serial enzymes to complete the rule out. PE ruled       out with negative D-dimer. Will check lipid panel in the morning.    2.  Chronic active alcohol abuse/withdrawal symptoms.        [facility] protocol. He was admitted July 26, 2008 to July 31, 2008 with       alcoholic pancreatitis without pseudocyst. He is downplaying his alcohol       abuse, but clearly has clinical evidence of alcohol withdrawal syndrome       with an alcohol level of 31, tachycardic, tremulous/shaky, flushed. I       advised him that he has biochemical evidence of alcoholic liver disease,       and at that if he continues, he will die young of cirrhosis.    3.  Elevated BP without a diagnosis of hypertension due to chronic active      alcohol abuse/withdrawal.        IV Lopressor with parameters.    4.  Euthyroid sick syndrome.        This is likely due to chronic active alcohol  abuse/withdrawal symptoms and       he needs to repeat TFTs in 4 to 6 weeks.    5.  Epigastric abdominal pain.        Will check amylase, lipase in the morning. I doubt this is alcoholic       pancreatitis.  It is more likely alcoholic gastritis. Empiric Protonix and       IV Dilaudid for pain.    6.  Tobacco abuse.        Nicotine patch.    7.  Profound hypokalemia likely due to nausea and vomiting.        Replace p.o.          D:  01/12/2009 19:14 PM by Drucilla Schmidt. TRAFICANTE, DO ([id number])  T:  01/13/2009 05:51 AM by Minda Meo        cc:     Delphia Grates MD                                                                                                           Page 3 of 3  Authenticated and Edited by Freddi Che, DO On 01/13/09 6:15:26 PM

## 2011-06-22 NOTE — Discharge Summary (Signed)
Gregory [name], Gregory [name]                                                    MRN:          [id number]                                                          Account:      [id number]                                                     Document ID:  [id number]                                                                                                                                   MRN: [id number]  Admit Date: 02/07/2009  Discharge Date: 02/13/2009     ATTENDING PHYSICIAN:  Candy Sledge, MD        DISCHARGE DIAGNOSES:  1.  Acute pancreatitis.  2.  Alcohol abuse.  3.  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  4.  Bipolar disorder       CONSULTING PHYSICIANS:  Dr. Lambert Mody from infectious diseases.       HISTORY OF PRESENT ILLNESS:   The patient is a 35 year old male with history of ETOH abuse and recurrent  pancreatitis, admitted with complaints of sudden onset epigastric pain and  he was evaluated in the ER and found to have elevated lipase levels  consistent with pancreatitis.     HOSPITAL COURSE:  The patient was subsequently admitted to the hospital for the same.  CT of  the abdomen showed severe pancreatitis with no abscess or pseudocyst.  The  patient was managed conservatively with bowel rest, IV fluids, and  analgesics.  In view of somewhat severe pancreatitis and elevated white  count, the patient was started on empiric antibiotics including tigecycline  as patient is allergic to PENICILLIN per Dr. Lambert Mody.  Over the  course of the days, the patient symptomatically improved and _____ was  started back on orals, which he tolerated well with no recurrence of  symptoms.  A repeat CT scan was done during the course of this  hospitalization.  In view of persistent fevers, _____ did not show any  evidence of abscess pseudocyst formation.  The patient's case was discussed  Dr. Susy Frizzle and who did not feel there was any need for  continuation of  p.o. antibiotics.  The patient was  subsequently discharged home; alcohol  counseling for alcohol cessation was performed.     DISCHARGE INSTRUCTIONS:  The patient was discharged home with alcohol cessation and low-fat diet.     ACTIVITY:  As tolerated.     FOLLOWUP:                                                                                                           Page 1 of 2  MARQUESE, BURKLAND                                                    MRN:          [id number]                                                          Account:      [id number]                                                     Document ID:  [id number]                                                                                                                                   With PCP in 1 to 2 weeks.     Time spent for the discharge was over 30 minutes.              D:  02/19/2009 12:28 PM by Harrell Gave, MD [id number])  T:  02/21/2009 09:07 AM by RB           cc:  Page 2 of 2  Authenticated by Harrell Gave, M.D. On 03/05/2009 08:13:43 PM

## 2011-06-22 NOTE — Consults (Signed)
Gregory [name], Gregory [name]                                                    MRN:          [id number]                                                          Account:      [id number]                                                     Document ID:  [id number]                                               Service Date: 09/08/2009                                                                                    MRN: [id number]  Admit Date: 09/07/2009     Patient Location: [id number]   Patient Type: I     CONSULTING PHYSICIAN: Kelle Darting MD     REFERRING PHYSICIAN: DIANE L TRAFICANTE DO        REASON FOR CONSULTATION:  Abdominal pain.     HISTORY OF PRESENT ILLNESS:  The patient is a 35 year old gentleman with history of severe alcohol abuse  and history of pancreatitis admissions in the past presenting with  abdominal pain.  The pain is sharp, left-sided, radiating to his back,  similar to pancreatitis pain in the past.  He also had  associated nausea  and vomiting, nonbloody.  No coffee-grounds.  Bowel movement yesterday  without blood or black.  The pain started after he had been drinking.   Alcohol level in the emergency room elevated at 344.  On the floor, he was  treated for pancreatitis with pain medication, became oversedated and  required Narcan, and was transferred to the ICU.  In the ICU, he is  arousable, however, he has difficulty maintaining a conversation.     PAST MEDICAL HISTORY:  Alcohol abuse, 12 to 24 beers per day.  No known withdrawal seizures or  DTs.  Three  hospitalizations for pancreatitis in the past.  No abdominal  surgeries and never an EGD or colonoscopy.     MEDICATIONS:  At home none.     ALLERGIES:  PENICILLIN.     SOCIAL HISTORY:  No drugs, alcohol use as above.  Tobacco 1 pack per day.  Works in [employer].     FAMILY HISTORY:  Unable  to obtain.     REVIEW OF SYSTEMS:                                                                                                            Page 1 of 2  Gregory [name], Gregory [name]                                                    MRN:          [id number]                                                          Account:      [id number]                                                     Document ID:  [id number]                                               Service Date: 09/08/2009                                                                                    Difficult to obtain, however, he denies chest pain or shortness of breath.     PHYSICAL EXAMINATION:  VITAL SIGNS:  Heart rate 90, respirations 12, blood pressure 119/76, oxygen  saturation 96%.  GENERAL:  Arousable.  HEENT:  Mouth dry mucous membranes.  Eyes anicteric.  CARDIOVASCULAR:  Tachycardic rate with regular rhythm.  PULMONARY:  Clear to auscultation bilaterally.  ABDOMEN:  Tender to palpation, mainly in the left upper quadrant.  Positive  bowel sounds.  No rebound, no guarding.  EXTREMITIES:  No edema.     LABORATORY DATA:  On admission, white count 13.27, follow up 10.8.  Admission hemoglobin  15.7, follow up 13.8, platelets 175.  Sodium 143, potassium 4.1, chloride  107, bicarbonate 24, BUN 8, creatinine 0.75, glucose 92, calcium 9.7.   Alcohol level 344, amylase 53, lipase 152, AST 87, ALT 67, bilirubin 0.2,  alkaline phosphatase 69, albumin 5.2, protein 8.  CT scan showing no  evidence of pancreatitis; however, prominent appendix, which could reflect  appendicitis.     IMPRESSION:  35 year old male presenting with abdominal pain, nausea, vomiting after    alcohol ingestion.  History of pancreatitis symptoms similar to pancreatitis    in the past.      These symptoms could represent now chronic pancreatitis without significant   inflammatory response versus gastritis; however, I am concerned regarding CT   finding of appendicitis.    1 Recommend surgical evaluation to review the CT scan as well as to evaluate   the patient.   2.  I agree with ICU monitoring at this point, given the need for    Narcan.   3. Recommend aggressive hydration as admission hemoglobin greater than 14.   4. Recommend following electrolytes closely.    5. Recommend careful pain control.   6. N.p.o. at this time.    7. EtOH Withdrawal precautions.         D:  09/08/2009 07:15 AM by Kelle Darting, MD [phone])  T:  09/08/2009 12:39 PM by Minda Meo        cc:                                                                                                                Page 2 of 2  Authenticated and Edited by Kelle Darting, M.D. On 09/30/09 9:01:15 PM

## 2011-06-22 NOTE — Op Note (Signed)
MILLAN, LEGAN                                                    MRN:          [id number]                                                          Account:      [id number]                                                     Document ID:  [id number]                                               Procedure Date: 09/09/2009                                                                                    MRN: [id number]  Admit Date: 09/07/2009     Patient Location: [id number]   Patient Type: I     SURGEON: CHRISTINE M HABIB MD  ASSISTANT:          PREOPERATIVE DIAGNOSES:  1.  Left lower quadrant abdominal pain and right lower quadrant tenderness  on physical exam.    2.  Enlarged appendix on CT scan, suggestive of appendicitis.     POSTOPERATIVE DIAGNOSIS:  Acute and chronic appendicitis, final pathology pending.     TITLE OF PROCEDURE:  Laparoscopic appendectomy.     ANESTHESIA:  General endotracheal.     ESTIMATED BLOOD LOSS:  Minimal.     INTRAOPERATIVE FLUID:  See anesthesia record.     COMPLICATIONS:  None.     CONDITION:  Stable.     DESCRIPTION OF PROCEDURE:  The patient was on Levaquin IV preoperatively.  He was brought to the  operating room and placed in the supine position.  Pneumatic compression  devices were placed on bilateral lower extremities.  After general  anesthesia was induced, the patient's abdomen was prepped and draped in  sterile fashion.  One-half percent Marcaine with epinephrine was used for  local anesthesia prior to each trocar insertion.  A 5-mm umbilical incision  was made.  Using a towel clamp to retract the abdominal wall anteriorly, a  Veress needle was inserted and confirmed to be intra-abdominal using the  Page 1 of 2  RAMEEN, QUINNEY                                                    MRN:          [id number]                                                          Account:       [id number]                                                     Document ID:  [id number]                                               Procedure Date: 09/09/2009                                                                                    water drop test.  CO2 insufflation to 15 mmHg was achieved.  A 5-mm trocar  was placed and camera was inserted.  Upon initial inspection of the  abdomen, there was no evidence of any inadvertent injury to the  intra-abdominal structures upon entry.  Under direct visualization, a 5-mm  suprapubic trocar was placed as well as a 12-mm left lower quadrant trocar.   The appendix was identified in the right lower quadrant and appeared to be  markedly thickened and encased in a large amount of fatty tissue, it  appeared to be acutely inflamed, as well as thickened, suggestive of  chronic inflammatory changes.  The appendix was amputated at its base at  the level of the cecum using the endoscopic GIA stapling device.  The  device was reloaded and used to divide the mesoappendix.  The appendix was  removed via an Endobag through the left lower quadrant incision site and  sent to pathology for permanent section.  The right lower quadrant was  inspected, and there was good hemostasis, no evidence of any fecal  contamination.  The abdomen was decompressed.  All trocars were removed.   The fascia of the left lower quadrant incision site was closed using an 0  Vicryl suture.  All skin incisions were closed using 4-0 Monocryl in a  subcuticular fashion.  Benzoin and Steri-Strips were applied.  The patient  recovered from anesthesia in good condition, transferred to the recovery  room and subsequently to the surgical floor for further postoperative care.              D:  09/09/2009 15:28 PM by Marolyn Haller, MD ([id number])  T:  09/09/2009 16:16 PM by Gwenyth Bender        cc: Delphia Grates MD                                                                                                           Page  2 of 2  Authenticated by Marolyn Haller, MD On 09/14/2009 11:18:55 AM

## 2011-06-22 NOTE — H&P (Signed)
Gregory [name], Gregory [name]                                                    MRN:          [id number]                                                          Account:      [id number]                                                     Document ID:  [id number]                                                                                                                                   MRN: [id number]  Admit Date: 07/26/2008     Patient Location: [id number]   Patient Type: I     ATTENDING PHYSICIAN: Tempie Hoist, MD        CHIEF COMPLAINT:  Abdominal pain.       HISTORY OF PRESENT ILLNESS:   This 35 year old male states that he was awakened about 1 AM on the morning  of admission with severe pain in his left upper abdomen, radiating across  to the right upper abdomen.  He says that yesterday he had several episodes  of vomiting, but no abdominal pain, fever, chills or diarrhea.  Today, the  pain is quite severe. He says that he thinks that he had a pancreas  inflammation once, many years ago.  The patient admits to drinking at least  6 to 7 beers a day, more on weekends, but he says he has not had anything  to drink for the past 2 days.  He has never had problems with withdrawal.   In the emergency room, evaluation was consistent with acute pancreatitis.   He is admitted for further care.       PAST MEDICAL HISTORY:  The patient denies any other medical problems of any kind except for  bipolar disorder and he takes Seroquel 400 mg at bedtime for the past 3 or  4 years.  He says it works well.      ALLERGIES:  PENICILLIN CAUSES HIVES.      PAST SURGICAL HISTORY:  Vasectomy.       CURRENT MEDICATIONS:   Seroquel 400 mg  p.o. q.h.s.;  he denies taking any other medications.  FAMILY HISTORY:   The patient is an only child.  His mother is alive at age 22, his father  alive at age 23, and he says both are in excellent health.      SOCIAL HISTORY:   The patient has been a one pack per day smoker for the past 5 years.  He  asks me  now if he can smoke a cigarette.  He drinks at least 6 to 7 beers a                                                                                                           Page 1 of 3  Gregory [name], Gregory [name]                                                    MRN:          [id number]                                                          Account:      [id number]                                                     Document ID:  [id number]                                                                                                                                   day, probably more heavily on weekends.  He is a Nutritional therapist by trade.      REVIEW OF SYSTEMS:   The patient denies any other medical problems except as noted above.      PHYSICAL EXAMINATION ON ADMISSION:  GENERAL:  The patient is alert, appears in good health, but in obvious  pain.   VITAL SIGNS:  Blood pressure 182/92, pulse 101 and regular, respirations  22, temperature 98, O2 saturation 97% on room air.   HEENT:  Head is normal.  Temporal arteries are  normal to palpation. Pupils  are equal and reactive to light. Conjunctivae are normal and without  discharge. ENT negative.  Mucous membranes are moist.    NECK:  There is no cervical or supraclavicular adenopathy. There is no  jugular venous distention. Carotids are 2+ and equal bilaterally.  Trachea  is midline.   BACK:  Nontender to palpation.   LUNGS:  Clear to auscultation.   CORE:  Regular rate without murmur, rub, gallop or click.   ABDOMEN:   Soft except voluntary guarding prohibits proper exam in the  upper quadrants. There is no rebound tenderness. Bowel sounds are active.  There is no abdominal distention.  He reacts vigorously with apparent  severe pain with the slightly touch in his upper abdomen, yet when  distracted, I am able to palpate more deeply.   EXTREMITIES:  Benign without edema.  Foot pulses are strong and equal  bilaterally.   SKIN:  Normal.   NEURO:  The patient is oriented x 3.  Cranial nerves II through XII are  intact.  There is no focal motor deficit.   PSYCHIATRIC:  Unremarkable.       LABORATORY DATA:   CBC shows a white count of 12,300, hemoglobin 15.1, hematocrit 42.3%, MCV  is 101, platelets 191,000.  Multi-chemistry profile shows sodium of 133,  chloride 99, total bilirubin 1.6, AST 350, ALT 165, alkaline phosphatase is  normal at 88, lipase is 887, manual differential shows 75% segmented  neutrophils, 4% bands, 14% lymphocytes.      CT scan of the abdomen and pelvis with contrast shows findings consistent  with fatty infiltrations of liver, acute pancreatitis without pseudocyst or  abscess and a partially visualized bulla in the left lower lobe.      Abdominal ultrasound is normal.      ASSESSMENT AND PLAN:                                                                                                            Page 2 of 3  Gregory [name], Gregory [name]                                                    MRN:          [id number]                                                          Account:      [id number]  Document ID:  [id number]                                                                                                                                   1.  Acute pancreatitis, most likely secondary to alcohol consumption.  The  patient will be admitted to the medical floor with intravenous fluids,  adequate pain control with repeat labs in the morning.  I will get serum  TSH as well as a fasting triglycerides level in the morning.   2.  Elevated liver function tests.  His AST to ALT ratio is 2:1, consistent  with a typical alcoholic hepatitis picture.  We will repeat labs in the  morning, with further workup based upon those results.   3.  Fatty infiltration of the liver on CT  scan.   4.  Smoker.  He will be given smoking cessation information as well as a  nicotine patch.   5.  Bipolar disorder.  We will continue his Seroquel.               D:  07/26/2008 11:32 AM by Anthony Sar, MD ([id number])  T:  07/26/2008 16:18 PM by MD        cc:     Delphia Grates MD                                                                                                           Page 3 of 3  Authenticated and Edited by Dorann Ou, MD On 07/26/08 5:41:46 PM

## 2011-07-08 NOTE — Progress Notes (Signed)
Gregory [name], Gregory [name]                                                    MRN:          [id number]                                                          Account:      [id number]                                                     Document ID:  [id number]                                                                                                                                   Account Number: [id number]  Admit Date: 02/07/2009     Patient Location: DISCHARGED 02/13/2009  Patient Type: I     ATTENDING PHYSICIAN: Candy Sledge, MD        The patient was admitted with severe pancreatitis and not peritonitis and  was started on empiric Tygacil for pancreatitis and not peritonitis.  The  patient was discharged on Feb 13, 2009, after resolution of the  pancreatitis.              D:  03/13/2009 10:54 AM by Harrell Gave, MD ([id number])  T:  03/16/2009 13:17 PM by Gwenyth Bender        cc:                                                                                                            Page 1 of 1  Authenticated by Harrell Gave, M.D. On 03/21/2009 05:14:42 PM

## 2011-11-24 ENCOUNTER — Emergency Department
Admission: EM | Admit: 2011-11-24 | Discharge: 2011-11-24 | Disposition: A | Discharge status: Home / Self Care | Payer: BC Managed Care – PPO | Attending: Emergency Medicine | Admitting: Emergency Medicine

## 2011-11-24 DIAGNOSIS — Z5320 Procedure and treatment not carried out because of patient's decision for unspecified reasons: Secondary | ICD-10-CM | POA: Insufficient documentation

## 2011-11-25 NOTE — ED Provider Notes (Signed)
I was not involved in the care or followup of this patient.    Lenord Fellers, MD  11/25/11 1051

## 2012-02-06 ENCOUNTER — Emergency Department
Admission: EM | Admit: 2012-02-06 | Discharge: 2012-02-06 | Disposition: A | Discharge status: Home / Self Care | Payer: BC Managed Care – PPO | Attending: Emergency Medical Services | Admitting: Emergency Medical Services

## 2012-02-06 ENCOUNTER — Emergency Department: Payer: BC Managed Care – PPO

## 2012-02-06 ENCOUNTER — Emergency Department: Payer: Self-pay

## 2012-02-06 DIAGNOSIS — Y9269 Other specified industrial and construction area as the place of occurrence of the external cause: Secondary | ICD-10-CM | POA: Insufficient documentation

## 2012-02-06 DIAGNOSIS — Z880 Allergy status to penicillin: Secondary | ICD-10-CM | POA: Insufficient documentation

## 2012-02-06 DIAGNOSIS — E079 Disorder of thyroid, unspecified: Secondary | ICD-10-CM | POA: Insufficient documentation

## 2012-02-06 DIAGNOSIS — S96909A Unspecified injury of unspecified muscle and tendon at ankle and foot level, unspecified foot, initial encounter: Secondary | ICD-10-CM | POA: Insufficient documentation

## 2012-02-06 DIAGNOSIS — S91019A Laceration without foreign body, unspecified ankle, initial encounter: Secondary | ICD-10-CM

## 2012-02-06 DIAGNOSIS — S81809A Unspecified open wound, unspecified lower leg, initial encounter: Secondary | ICD-10-CM | POA: Insufficient documentation

## 2012-02-06 DIAGNOSIS — W208XXA Other cause of strike by thrown, projected or falling object, initial encounter: Secondary | ICD-10-CM | POA: Insufficient documentation

## 2012-02-06 HISTORY — DX: Disorder of thyroid, unspecified: E07.9

## 2012-02-06 MED ORDER — OXYCODONE-ACETAMINOPHEN 5-325 MG PO TABS
ORAL_TABLET | ORAL | Status: AC
Start: 2012-02-06 — End: 2012-02-06
  Administered 2012-02-06: 2
  Filled 2012-02-06: qty 2

## 2012-02-06 MED ORDER — OXYCODONE-ACETAMINOPHEN 5-325 MG PO TABS
ORAL_TABLET | ORAL | Status: DC
Start: 2012-02-06 — End: 2012-02-13

## 2012-02-06 NOTE — Discharge Instructions (Signed)
Return to the ER for suture removal in 7-10 days.    Thank you for choosing [facility] for your emergency care needs.  We strive to provide EXCELLENT care to you and your family.      If you do not continue to improve or your condition worsens, please contact your doctor or return immediately to the Emergency Department.    DOCTOR REFERRALS  Call [phone] if you need any further referrals and we can help you find a primary care doctor or specialist.  Also, available online at:  [email or web address]    YOUR CONTACT INFORMATION  Before leaving please check with registration to make sure we have an up-to-date contact number.  You can call registration at [phone] to update your information.  For questions about your hospital bill, please call [phone].  For questions about your Emergency Dept Physician bill please call [phone].      FREE HEALTH SERVICES  If you need help with health or social services, please call 2-1-1 for a free referral to resources in your area.  2-1-1 is a free service connecting people with information on health insurance, free clinics, pregnancy, mental health, dental care, food assistance, housing, and substance abuse counseling.  Also, available online at:  http://[email or web address].org    MEDICAL RECORDS AND TESTS  Certain laboratory test results do not come back the same day, for example urine cultures.   We will contact you if other important findings are noted.  Radiology films are often reviewed again to ensure accuracy.  If there is any discrepancy, we will notify you.      Please call [phone] to pick up a complimentary CD of any radiology studies performed.  If you or your doctor would like to request a copy of your medical records, please call [phone].      ORTHOPEDIC INJURY   Please know that significant injuries can exist even when an initial x-ray is read as normal or negative.  This can occur because some fractures  (broken bones) are not initially visible on x-rays.  For this reason, close outpatient follow-up with your primary care doctor or bone specialist (orthopedist) is required.    MEDICATIONS AND FOLLOWUP  Please be aware that some prescription medications can cause drowsiness.  Use caution when driving or operating machinery.    The examination and treatment you have received in our Emergency Department is provided on an emergency basis, and is not intended to be a substitute for your primary care physician.  It is important that your doctor checks you again and that you report any new or remaining problems at that time.

## 2012-02-06 NOTE — ED Provider Notes (Signed)
Physician/Midlevel provider first contact with patient: 2147    History   No chief complaint on file.    History provided by: Patient  Gregory [name] is a 36 y.o. male who p/w 3 inch actively bleeding L ankle laceration after a piece of copper fell onto his ankle at 2010 while at work. No paresthesias.        Past Medical History   Diagnosis Date   . Thyroid disease        Past Surgical History   Procedure Date   . Appendectomy        No family history on file.    Social  History   Substance Use Topics   . Smoking status: Current Everyday Smoker -- 0.5 packs/day   . Smokeless tobacco: Not on file   . Alcohol Use: Yes      daily       Allergies   Allergen Reactions   . Penicillins Hives       Current/Home Medications    No medications on file        Review of Systems   Skin: Positive for wound.   Neurological: Negative for numbness.   All other systems reviewed and are negative.        Physical Exam    BP 137/75  Pulse 90  Temp(Src) 98.9 F (37.2 C) (Oral)  Resp 20  Ht 1.93 m  Wt 90.719 kg  BMI 24.34 kg/m2  SpO2 97%    Physical Exam  CONSTITUTIONAL Vital Signs Reviewed, Well appearing  HEAD Atraumatic, Normocephalic.  EYES Eyes are normal to Inspection, Sclera are normal, Conjunctiva are normal  NECK  Normal ROM  RESPIRATORY CHEST No respiratory distress.  UPPER EXTREMITY Inspection normal, No cyanosis.  LOWER EXTREMITY 3 inch well approximated linear laceration to L medial ankle. Good distal pulses. Good distal sensation.  NEURO GCS is 15, Speech normal  SKIN Skin is warm, Skin Is dry, Skin is normal color. Skin exam normal except as noted.  PSYCHIATRIC Oriented X 3, Normal affect.        MDM and ED Course     ED Medication Orders      Start     Status Ordering Provider    02/06/12 2114   oxyCODONE-acetaminophen (PERCOCET) 5-325 MG per tablet      Comments: Created by cabinet override        Last MAR action:  Given                  MDM      Procedures        Data Review     Nursing records reviewed and  agree: Yes    Radiologic study results reviewed by ED provider (yes/no): Yes    I, Harden Mo, MD, personally performed the services documented.  Gregory [name] is scribing for me on Gregory [name],Gregory [name]. I reviewed and confirm the accuracy of the information in this medical record.     I, Emeline General, am serving as a Neurosurgeon to document services personally performed by Harden Mo, MD based on the provider'[name] statements to me.     Rendering Provider: Harden Mo, MD    Doctor'[name] Notes and MDM     Laceration repair by Weyman Rodney, PA-C.    Laceration Repair    Time Out:  The patient was identified by medical record number and confirming the patient'[name] name. The side and location of wound/laceration was verified. Verbal consent  obtained.     Tetanus Status:  Tetanus status up to date (yes/no): yes  Tetanus ordered (yes/no): no    Local Anesthetic Used: 1% lido with epi  Amount Used: 7 cc    Full Sterile Prep:  - Sterile procedures observed with sterile site prep [Chlorhexadine; Betadine; Sure Cleanse]: chlorhexadine and hand washing  - Normal Saline Irrigation (yes/no): Yes  - Sterile Gloves: Yes, 6.5  - Mask: No  - Gown: No  - Sterile Drape: Yes    Laceration Exam:    Anatomic Location: right medial ankle  Shape of Laceration (linear, avulsion, stellate, etc): linear  Puncture Wound (yes/no): no  Base of laceration visualized under a bloodless field. Clean wound edges. No foreign bodies noted.     Simple vs Complex:  Length (cm): 5 cm  Superficial vs Deep: superficial  Simple vs Complex: simple    Laceration Procedure:  Extensive wound cleaning and irrigation.  Suture Type and Size: 4-0 nylon  Superficial vs Deep Sutures: superficial  Suture Technique: simple interrupted  Number of sutures/staples placed: 11    Post Procedure:  Neurovascular status normal after procedure.  Patient tolerated procedure (well, fairly well, poorly): Well  Complications: None    Patient instructed on wound care instructions. Pt advised that they  may return to the ER or see their PCP to have sutures/staples removed. Cautioned to return for any signs or symptoms of infection including fever, chills, redness, drainage, etc.             Orders Placed During This Encounter     Orders Placed This Encounter   Procedures   . Ankle Left AP Lateral And Oblique       Diagnostic Study Results     Labs  Results     ** No Results found for the last 24 hours. **          Radiologic Studies  Radiology Results (24 Hour)     Procedure Component Value Units Date/Time    Ankle Left AP Lateral And Oblique [[id number]] Collected:02/06/12 2144    Order Status:Completed  Updated:02/06/12 2154    Narrative:    HISTORY: Left ankle laceration.     Laceration is seen in the medial distal left leg with no fracture or  foreign body.       Impression:     No fracture or foreign body.          Clinical Impression & Disposition     Clinical Impression  Final diagnoses:   Laceration of ankle        ED Disposition     Discharge Gregory [name] discharge to home/self care.    Condition at discharge: Stable             New Prescriptions    OXYCODONE-ACETAMINOPHEN (PERCOCET) 5-325 MG PER TABLET    1 tab every 4 to 6 hours as needed for pain        Treatment Team: Scribe: Cristal Generous, MD  02/07/12 [phone]

## 2012-02-06 NOTE — ED Notes (Signed)
Pt here for c/o left ankle injury, about 3 inch laceration when a piece of copper fell on his left leg happened about 2010, pressure dsg on pt fatl light headed , a/o

## 2012-02-06 NOTE — ED Notes (Signed)
dsg to left ankle cdi, pt denies any other complasin

## 2012-02-06 NOTE — ED Notes (Signed)
Bed:B15<BR> Expected date:<BR> Expected time:<BR> Means of arrival:<BR> Comments:<BR> Medic 415 ankle lac

## 2012-02-13 ENCOUNTER — Emergency Department
Admission: EM | Admit: 2012-02-13 | Discharge: 2012-02-13 | Disposition: A | Discharge status: Home / Self Care | Payer: BC Managed Care – PPO | Attending: Emergency Medicine | Admitting: Emergency Medicine

## 2012-02-13 ENCOUNTER — Emergency Department: Payer: BC Managed Care – PPO

## 2012-02-13 DIAGNOSIS — T8140XA Infection following a procedure, unspecified, initial encounter: Secondary | ICD-10-CM | POA: Insufficient documentation

## 2012-02-13 DIAGNOSIS — Z9089 Acquired absence of other organs: Secondary | ICD-10-CM | POA: Insufficient documentation

## 2012-02-13 DIAGNOSIS — W19XXXA Unspecified fall, initial encounter: Secondary | ICD-10-CM | POA: Insufficient documentation

## 2012-02-13 DIAGNOSIS — L089 Local infection of the skin and subcutaneous tissue, unspecified: Secondary | ICD-10-CM | POA: Insufficient documentation

## 2012-02-13 DIAGNOSIS — F17200 Nicotine dependence, unspecified, uncomplicated: Secondary | ICD-10-CM | POA: Insufficient documentation

## 2012-02-13 DIAGNOSIS — Y849 Medical procedure, unspecified as the cause of abnormal reaction of the patient, or of later complication, without mention of misadventure at the time of the procedure: Secondary | ICD-10-CM | POA: Insufficient documentation

## 2012-02-13 DIAGNOSIS — T148XXA Other injury of unspecified body region, initial encounter: Secondary | ICD-10-CM | POA: Insufficient documentation

## 2012-02-13 MED ORDER — ACETAMINOPHEN-CODEINE 300-30 MG PO TABS
1.00 | ORAL_TABLET | Freq: Four times a day (QID) | ORAL | Status: AC | PRN
Start: 2012-02-13 — End: 2012-02-23

## 2012-02-13 MED ORDER — CEPHALEXIN 500 MG PO CAPS
500.00 mg | ORAL_CAPSULE | Freq: Three times a day (TID) | ORAL | Status: AC
Start: 2012-02-13 — End: 2012-02-20

## 2012-02-13 NOTE — ED Provider Notes (Signed)
Physician/Midlevel provider first contact with patient: 13    History     Chief Complaint   Patient presents with   . Wound Infection   . Wound Check     History provided by patient.    CHIRSTOPHER [name] is a 36 y.o. male, 1 week s/p L ankle injury with laceration, p/w progressively worsening pain around suture site on medial L ankle since yesterday. Mild relief with Advil. Associated with surrounding numbness. Denies fever or wound discharge. Initial injury occurred when pt was hit by a falling copper pipe. No new trauma since.    PMD:     Past Medical History   Diagnosis Date   . Thyroid disease        Past Surgical History   Procedure Date   . Appendectomy        No family history on file.    Social  History   Substance Use Topics   . Smoking status: Current Everyday Smoker -- 0.5 packs/day   . Smokeless tobacco: Not on file   . Alcohol Use: Yes      daily       Allergies   Allergen Reactions   . Penicillins Hives       Current/Home Medications    No medications on file      Review of Systems   Constitutional: Negative for fever.   Skin: Positive for wound.        Negative for wound discharge.   Neurological: Positive for numbness.   All other systems reviewed and are negative.      Physical Exam    BP 150/109  Pulse 134  Temp(Src) 98.5 F (36.9 C) (Temporal Artery)  Resp 18  Ht 1.88 m  Wt 99.791 kg  BMI 28.25 kg/m2  SpO2 100%    Physical Exam    CONSTITUTIONAL   Patient is afebrile, Vital signs reviewed, Normal pulse,  Normal blood pressure, Normal respiratory rate, Well appearing, Patient appears comfortable, Alert and oriented X 3.  HEAD   Atraumatic, Normocephalic.  EYES   Eyes are normal to inspection.  NECK   Normal ROM.  RESPIRATORY CHEST   No respiratory distress.  UPPER EXTREMITY   Inspection normal.  LOWER EXTREMITY   Wound to L medial ankle appears to be healing well with mild erythema and swelling to the distal aspect, but no discharge. The distal aspect of the wound is very sensitive to touch.  There are no red streaks going up the leg. Sutures are in place.  NEURO   GCS is 15, Speech normal, Memory normal.  SKIN   Skin is warm, Skin is dry, Skin is normal color.  PSYCHIATRIC   Normal affect, Normal insight, Normal concentration.    MDM and ED Course     ED Medication Orders     None           MDM      Procedures    Clinical Impression & Disposition     Clinical Impression  Final diagnoses:   Wound infection        ED Disposition     Discharge [place] discharge to home/self care.    Condition at discharge: Stable             New Prescriptions    No medications on file        Treatment Team: Scribe: Artemio Aly      Data Review     Nursing records  reviewed and agree: Yes    Laboratory results reviewed by ED provider (yes/no):   Radiologic study results reviewed by ED provider (yes/no):     I, Melida Gimenez, MD, personally performed the services documented.  Gregory [name] is scribing for me on [name],[name]. I reviewed and confirm the accuracy of the information in this medical record.     I, Artemio Aly, am serving as a Neurosurgeon to document services personally performed by Melida Gimenez, MD based on the provider's statements to me.     Rendering Provider: Melida Gimenez, MD    Monitors, EKG     Cardiac Monitor (interpreted by ED physician):      EKG (interpreted by ED physician):       Doctor's Notes and MDM     6:17 PM, Sutures were removed by tech.      Orders Placed During This Encounter     No orders of the defined types were placed in this encounter.       Diagnostic Study Results     Labs  Results     ** No Results found for the last 24 hours. **          Radiologic Studies  Radiology Results (24 Hour)     ** No Results found for the last 24 hours. Melida Gimenez, MD  02/13/12 [phone]

## 2012-02-13 NOTE — ED Notes (Signed)
Pt has laceration injury to left ankle 1 week ago. Redness and swelling noted around the wound. No drainage.

## 2012-02-13 NOTE — ED Notes (Signed)
E-sign unavailable. Discharge paper signed and copied.

## 2012-02-13 NOTE — Discharge Instructions (Signed)
Thank you for choosing [facility] for your emergency care needs.  We strive to provide EXCELLENT care to you and your family.      If you do not continue to improve or your condition worsens, please contact your doctor or return immediately to the Emergency Department.    DOCTOR REFERRALS  Call [phone] if you need any further referrals and we can help you find a primary care doctor or specialist.  Also, available online at:  [email or web address]    YOUR CONTACT INFORMATION  Before leaving please check with registration to make sure we have an up-to-date contact number.  You can call registration at [phone] to update your information.  For questions about your hospital bill, please call [phone].  For questions about your Emergency Dept Physician bill please call [phone].      FREE HEALTH SERVICES  If you need help with health or social services, please call 2-1-1 for a free referral to resources in your area.  2-1-1 is a free service connecting people with information on health insurance, free clinics, pregnancy, mental health, dental care, food assistance, housing, and substance abuse counseling.  Also, available online at:  [email or web address].org    MEDICAL RECORDS AND TESTS  Certain laboratory test results do not come back the same day, for example urine cultures.   We will contact you if other important findings are noted.  Radiology films are often reviewed again to ensure accuracy.  If there is any discrepancy, we will notify you.      Please call [phone] to pick up a complimentary CD of any radiology studies performed.  If you or your doctor would like to request a copy of your medical records, please call [phone].      ORTHOPEDIC INJURY   Please know that significant injuries can exist even when an initial x-ray is read as normal or negative.  This can occur because some fractures (broken bones) are not initially visible on x-rays.   For this reason, close outpatient follow-up with your primary care doctor or bone specialist (orthopedist) is required.    MEDICATIONS AND FOLLOWUP  Please be aware that some prescription medications can cause drowsiness.  Use caution when driving or operating machinery.    The examination and treatment you have received in our Emergency Department is provided on an emergency basis, and is not intended to be a substitute for your primary care physician.  It is important that your doctor checks you again and that you report any new or remaining problems at that time.

## 2012-06-13 LAB — ECG 12-LEAD
Atrial Rate: 105 {beats}/min
Atrial Rate: 109 {beats}/min
Atrial Rate: 56 {beats}/min
Atrial Rate: 89 {beats}/min
P Axis: 48 degrees
P Axis: 61 degrees
P Axis: 65 degrees
P Axis: 74 degrees
P-R Interval: 164 ms
P-R Interval: 184 ms
P-R Interval: 186 ms
P-R Interval: 140 ms
Q-T Interval: 350 ms
Q-T Interval: 350 ms
Q-T Interval: 494 ms
Q-T Interval: 402 ms
QRS Duration: 100 ms
QRS Duration: 100 ms
QRS Duration: 96 ms
QRS Duration: 116 ms
QTC Calculation (Bezet): 462 ms
QTC Calculation (Bezet): 471 ms
QTC Calculation (Bezet): 476 ms
QTC Calculation (Bezet): 489 ms
R Axis: 44 degrees
R Axis: 55 degrees
R Axis: 59 degrees
R Axis: 37 degrees
T Axis: 53 degrees
T Axis: 61 degrees
T Axis: 64 degrees
T Axis: 47 degrees
Ventricular Rate: 105 {beats}/min
Ventricular Rate: 109 {beats}/min
Ventricular Rate: 56 {beats}/min
Ventricular Rate: 89 {beats}/min

## 2012-06-14 LAB — ECG 12-LEAD
Atrial Rate: 92 {beats}/min
P Axis: 70 degrees
P-R Interval: 182 ms
Q-T Interval: 362 ms
QRS Duration: 100 ms
QTC Calculation (Bezet): 447 ms
R Axis: 44 degrees
T Axis: 53 degrees
Ventricular Rate: 92 {beats}/min

## 2013-09-14 ENCOUNTER — Emergency Department: Payer: Self-pay

## 2013-09-14 ENCOUNTER — Emergency Department
Admission: EM | Admit: 2013-09-14 | Discharge: 2013-09-15 | Disposition: A | Discharge status: Home / Self Care | Payer: Self-pay | Attending: Emergency Medicine | Admitting: Emergency Medicine

## 2013-09-14 DIAGNOSIS — R209 Unspecified disturbances of skin sensation: Secondary | ICD-10-CM | POA: Insufficient documentation

## 2013-09-14 DIAGNOSIS — G619 Inflammatory polyneuropathy, unspecified: Secondary | ICD-10-CM | POA: Insufficient documentation

## 2013-09-14 LAB — VH URINE DRUG SCREEN - NO CONFIRMATION
Amphetamine: NEGATIVE
Barbiturates: NEGATIVE
Cannabinoids: NEGATIVE
Cocaine: NEGATIVE
Methamphetamine: NEGATIVE
Opiates: NEGATIVE
Phencyclidine: NEGATIVE
Propoxyphene: NEGATIVE
Urine Benzodiazepines: NEGATIVE
Urine Methadone Screen: NEGATIVE
Urine Oxycodone: NEGATIVE
Urine Tricyclics: NEGATIVE

## 2013-09-14 LAB — CBC AND DIFFERENTIAL
Basophils %: 0.6 % (ref 0.0–3.0)
Basophils Absolute: 0 10*3/uL (ref 0.0–0.3)
Eosinophils %: 0.7 % (ref 0.0–7.0)
Eosinophils Absolute: 0 10*3/uL (ref 0.0–0.8)
Hematocrit: 41.6 % (ref 39.0–52.5)
Hemoglobin: 14 gm/dL (ref 13.0–17.5)
Lymphocytes Absolute: 2.3 10*3/uL (ref 0.6–5.1)
Lymphocytes: 46.9 % — ABNORMAL HIGH (ref 15.0–46.0)
MCH: 37 pg — ABNORMAL HIGH (ref 28–35)
MCHC: 34 gm/dL (ref 33–37)
MCV: 109 fL — ABNORMAL HIGH (ref 80–100)
MPV: 8.3 fL (ref 8.0–12.0)
Monocytes Absolute: 0.6 10*3/uL (ref 0.1–1.7)
Monocytes: 11.3 % (ref 3.0–15.0)
Neutrophils %: 40.5 % — ABNORMAL LOW (ref 42.0–78.0)
Neutrophils Absolute: 2 10*3/uL (ref 1.7–8.6)
PLT CT: 103 10*3/uL — ABNORMAL LOW (ref 130–440)
RBC: 3.83 10*6/uL — ABNORMAL LOW (ref 4.00–5.70)
RDW: 14.7 % (ref 12.0–15.0)
WBC: 4.9 10*3/uL (ref 4.0–11.0)

## 2013-09-14 LAB — URINALYSIS, REFLEX TO MICROSCOPIC EXAM IF INDICATED
Bilirubin, UA: NEGATIVE mg/dL
Blood, UA: NEGATIVE mg/dL
Glucose, UA: NEGATIVE mg/dL
Ketones UA: NEGATIVE mg/dL
Leukocyte Esterase, UA: NEGATIVE Leu/uL
Nitrite, UA: NEGATIVE
Urine Specific Gravity: 1.022 (ref 1.001–1.040)
Urobilinogen, UA: 1 mg/dL — AB
WBC, UA: NONE SEEN /hpf
pH, Urine: 6 pH (ref 5.0–8.0)

## 2013-09-14 LAB — ETHANOL: Alcohol: 386 mg/dL (ref 0–9)

## 2013-09-14 LAB — COMPREHENSIVE METABOLIC PANEL
ALT: 112 U/L — ABNORMAL HIGH (ref 0–55)
AST (SGOT): 312 U/L — ABNORMAL HIGH (ref 10–42)
Albumin/Globulin Ratio: 1.04 Ratio (ref 0.70–1.50)
Albumin: 4.4 gm/dL (ref 3.5–5.0)
Alkaline Phosphatase: 101 U/L (ref 40–145)
Anion Gap: 16.9 mMol/L (ref 7.0–18.0)
BUN / Creatinine Ratio: 15.3 Ratio (ref 10.0–30.0)
BUN: 11 mg/dL (ref 7–22)
Bilirubin, Total: 0.5 mg/dL (ref 0.1–1.2)
CO2: 25 mMol/L (ref 20.0–30.0)
Calcium: 9.7 mg/dL (ref 8.5–10.5)
Chloride: 105 mMol/L (ref 98–110)
Creatinine: 0.72 mg/dL (ref 0.60–1.30)
EGFR: >60 mL/min/{1.73_m2}
Globulin: 4.2 gm/dL — ABNORMAL HIGH (ref 2.0–4.0)
Glucose: 97 mg/dL (ref 70–99)
Osmolality Calc: 284 mOsm/kg (ref 275–300)
Potassium: 3.9 mMol/L (ref 3.5–5.3)
Protein, Total: 8.6 gm/dL — ABNORMAL HIGH (ref 6.0–8.3)
Sodium: 143 mMol/L (ref 136–147)

## 2013-09-14 LAB — VH DEXTROSE STICK GLUCOSE: Glucose POCT: 94 mg/dL (ref 70–99)

## 2013-09-14 NOTE — ED Notes (Signed)
Pt to restroom for sample

## 2013-09-14 NOTE — ED Notes (Addendum)
Pt presents to ER c/o B/L lower extremity numbness and R hand pain x1 week. Denies trauma, but states it is very painful to move hand. Denies SOB, CP, abdominal pain. Reports ETOH use.

## 2013-09-14 NOTE — ED Notes (Signed)
Blood glucose 94 mg/dL

## 2013-09-15 LAB — T4, FREE: T4 Free: 0.86 ng/dL (ref 0.70–1.48)

## 2013-09-15 LAB — SEDIMENTATION RATE: Sed Rate: 15 mm/hr (ref 0–20)

## 2013-09-15 LAB — TSH: TSH: 8.72 u[IU]/mL — ABNORMAL HIGH (ref 0.40–4.20)

## 2013-09-15 MED ORDER — KETOROLAC TROMETHAMINE 30 MG/ML IJ SOLN
30.0000 mg | Freq: Once | INTRAMUSCULAR | Status: AC
Start: 2013-09-15 — End: 2013-09-15
  Administered 2013-09-15: 30 mg via INTRAVENOUS

## 2013-09-15 MED ORDER — KETOROLAC TROMETHAMINE 30 MG/ML IJ SOLN
INTRAMUSCULAR | Status: AC
Start: 2013-09-15 — End: ?
  Filled 2013-09-15: qty 1

## 2013-09-15 NOTE — ED Notes (Signed)
Pt states hand feels better, given water PO.

## 2013-09-15 NOTE — ED Provider Notes (Signed)
Physician/Midlevel provider first contact with patient: 09/15/13 0134         History     Chief Complaint   Patient presents with   . Numbness     BOTH LEGS BELOW THE KNEES   X 1 WEEK     Numbness both legs from thighs down for 1 week. He has no problems with strength no difficulty walking. Also some right hand numbness.          Past Medical History   Diagnosis Date   . Thyroid disease        Past Surgical History   Procedure Date   . Appendectomy    . Knee surgery      Hardware placement R knee       Family History   Problem Relation Age of Onset   . Diabetes Mother    . Diabetes Father    . Diabetes Maternal Grandmother    . Diabetes Maternal Grandfather    . Diabetes Paternal Grandmother    . Diabetes Paternal Grandfather        Social  History   Substance Use Topics   . Smoking status: Current Every Day Smoker -- 0.5 packs/day   . Smokeless tobacco: Not on file   . Alcohol Use: Yes      Comment: daily       .     Allergies   Allergen Reactions   . Penicillins Hives       Current/Home Medications    No medications on file        Review of Systems   Constitutional: Negative for fever, chills, activity change and appetite change.   HENT: Positive for ear pain. Negative for congestion and sinus pressure.    Eyes: Negative for pain and itching.   Respiratory: Negative for cough, shortness of breath and wheezing.    Cardiovascular: Negative for chest pain.   Gastrointestinal: Negative for nausea, abdominal pain and constipation.   Genitourinary: Negative for dysuria, frequency, hematuria and difficulty urinating.   Musculoskeletal: Negative for back pain and gait problem.   Skin: Negative for rash.   Neurological: Negative for dizziness.   Hematological: Negative for adenopathy.   Psychiatric/Behavioral: Negative for confusion.       Physical Exam    BP: 164/104 mmHg, Heart Rate: 91 , Temp: 97.9 F (36.6 C), Resp Rate: 19 , SpO2: 99 %     Physical Exam   Constitutional: He is oriented to person, place, and time. He  appears well-developed and well-nourished.   HENT:   Head: Normocephalic.   Right Ear: External ear normal.   Left Ear: External ear normal.   Mouth/Throat: Oropharynx is clear and moist.   Eyes: Conjunctivae normal are normal. Pupils are equal, round, and reactive to light.   Neck: Normal range of motion. Neck supple.   Cardiovascular: Normal rate, normal heart sounds and intact distal pulses.    Pulmonary/Chest: Effort normal and breath sounds normal.   Abdominal: Soft. Bowel sounds are normal.   Musculoskeletal: Normal range of motion. He exhibits no edema and no tenderness.   Neurological: He is alert and oriented to person, place, and time. He has normal reflexes. He displays normal reflexes. No cranial nerve deficit. He exhibits normal muscle tone. Coordination normal.        States decreased sensation in both legs and right hand.   Skin: Skin is warm and dry.       MDM and ED Course  ED Medication Orders      Start     Status Ordering Provider    09/15/13 0019   ketorolac (TORADOL) injection 30 mg   Once in ED      Route: Intravenous  Ordered Dose: 30 mg         Last MAR action:  Given Onesha Krebbs BLAINE                 MDM  I was concerned regarding diabetes, thyroid, [place]ym[place], B12 folate deficiency, or neuropath    Procedures    Clinical Impression & Disposition     Clinical Impression  Final diagnoses:   Neuropathy        ED Disposition     Discharge Carleene [facility] [place] discharge to home/self care.    Condition at disposition: Stable             New Prescriptions    No medications on file           Everybody LE    Talmage Coin, MD  09/15/13 [phone]

## 2013-09-15 NOTE — Discharge Instructions (Signed)
Peripheral Neuropathy  Peripheral Neuropathy is a condition that affects the nerves of the arms or legs. It causes a change in physical feeling. Sometimes it causes weakness in the muscles. You may feel tingling, numbness or shooting pains (especially at night). You may be sensitive to light touch or temperature changes.  Neuropathy may be caused by being exposed to certain drugs or chemicals, or because of a vitamin deficiency. It can be a complication of a chronic disease such as diabetes or alcoholism. A ruptured disk with pressure on the spinal nerve may also cause this condition.  Home Care:  1) You may take acetaminophen (Tylenol) or ibuprofen (Advil, Motrin) for pain, unless another pain medicine has been prescribed.  2) If the neuropathy affects your feet, keep your toenails trimmed and wash your feet often. Wear shoes that fit well. Doing so avoids pressure points, blisters and ulcers. Due to a loss of feeling, you may not notice injuries, so look at your feet carefully (including the soles of your feet and between your toes) at least once a week. Tell your doctor if you have any open wounds or signs of infection.  3) If vitamins have been prescribed, be sure to remind yourself to take them daily.  Follow Up  with your doctor or as advised by our staff. You may need further testing to find out the exact cause of your neuropathy.  Get Prompt Medical Attention  if any of the following occur:  -- Redness, swelling or pus coming from the toes or feet  -- Loss of bowel or bladder control (if this is a new symptom for you)  -- Muscle weakness (if this is a new symptom for you)   2000-2014 [employer], [address], [place] [address]. All rights reserved. This information is not intended as a substitute for professional medical care. Always follow your healthcare professional's instructions.

## 2013-09-18 LAB — VITAMIN B12 AND FOLATE
Folate: 4.1 ng/mL — ABNORMAL LOW (ref 7.0–19.9)
Vitamin B-12: 569 pg/mL (ref 213–816)

## 2013-09-18 LAB — HEPATITIS PANEL, ACUTE
Hepatitis A IgM: NONREACTIVE
Hepatitis B Core IgM: NONREACTIVE
Hepatitis B Surface Antigen: NONREACTIVE
Hepatitis C Antibody: NONREACTIVE

## 2013-09-19 LAB — LYME ANTIBODIES, IGG BY WESTERN BLOT
Lyme Disease 18 kD IgG: NONREACTIVE
Lyme Disease 23 kD IgG: NONREACTIVE
Lyme Disease 28 kD IgG: NONREACTIVE
Lyme Disease 30 kD IgG: NONREACTIVE
Lyme Disease 39 kD IgG: NONREACTIVE
Lyme Disease 41 kD IgG: NONREACTIVE
Lyme Disease 45 kD IgG: NONREACTIVE
Lyme Disease 58 kD IgG: NONREACTIVE
Lyme Disease 66 kD IgG: REACTIVE — AB
Lyme Disease 93 kD IgG: NONREACTIVE
Lyme Disease Ab IgG, WB: NEGATIVE

## 2013-09-20 NOTE — ED Notes (Signed)
Attempting to reach patient to discuss lab results. Phoned [phone] and lady who answered states noone by that name at that phone number. Phoned [phone] and gentleman states noone by that name at that phone number stating he has had that phone number for 14 years. Phoned [phone], rings without answer or messaging available. Attempted [phone] multiple times, phone remains busy.    Manning Charity, MD  09/20/13 [phone]

## 2013-09-24 LAB — VH MISCELLANEOUS TEST: Test code and name of test: 6646

## 2014-08-07 ENCOUNTER — Observation Stay: Payer: Self-pay

## 2014-08-07 ENCOUNTER — Encounter: Payer: Self-pay | Admitting: Emergency Medicine

## 2014-08-07 ENCOUNTER — Observation Stay
Admission: RE | Admit: 2014-08-07 | Discharge: 2014-08-08 | Discharge status: Against Medical Advice | Payer: Self-pay | Source: Other Acute Inpatient Hospital | Attending: Internal Medicine | Admitting: Internal Medicine

## 2014-08-07 ENCOUNTER — Emergency Department: Payer: Self-pay

## 2014-08-07 ENCOUNTER — Emergency Department
Admission: EM | Admit: 2014-08-07 | Discharge: 2014-08-07 | Disposition: A | Discharge status: Other Acute Inpatient Hospital | Payer: Self-pay | Attending: Emergency Medicine | Admitting: Emergency Medicine

## 2014-08-07 ENCOUNTER — Observation Stay: Payer: Self-pay | Admitting: Internal Medicine

## 2014-08-07 DIAGNOSIS — E860 Dehydration: Principal | ICD-10-CM | POA: Insufficient documentation

## 2014-08-07 DIAGNOSIS — R1032 Left lower quadrant pain: Secondary | ICD-10-CM | POA: Diagnosis present

## 2014-08-07 DIAGNOSIS — K839 Disease of biliary tract, unspecified: Secondary | ICD-10-CM | POA: Insufficient documentation

## 2014-08-07 DIAGNOSIS — D696 Thrombocytopenia, unspecified: Secondary | ICD-10-CM | POA: Insufficient documentation

## 2014-08-07 DIAGNOSIS — F1010 Alcohol abuse, uncomplicated: Secondary | ICD-10-CM | POA: Diagnosis present

## 2014-08-07 DIAGNOSIS — F17200 Nicotine dependence, unspecified, uncomplicated: Secondary | ICD-10-CM | POA: Insufficient documentation

## 2014-08-07 DIAGNOSIS — R1110 Vomiting, unspecified: Secondary | ICD-10-CM

## 2014-08-07 DIAGNOSIS — Z880 Allergy status to penicillin: Secondary | ICD-10-CM | POA: Insufficient documentation

## 2014-08-07 DIAGNOSIS — E039 Hypothyroidism, unspecified: Secondary | ICD-10-CM | POA: Insufficient documentation

## 2014-08-07 DIAGNOSIS — F1021 Alcohol dependence, in remission: Secondary | ICD-10-CM | POA: Insufficient documentation

## 2014-08-07 DIAGNOSIS — K7010 Alcoholic hepatitis without ascites: Secondary | ICD-10-CM | POA: Insufficient documentation

## 2014-08-07 DIAGNOSIS — Z833 Family history of diabetes mellitus: Secondary | ICD-10-CM | POA: Insufficient documentation

## 2014-08-07 DIAGNOSIS — R112 Nausea with vomiting, unspecified: Secondary | ICD-10-CM | POA: Diagnosis present

## 2014-08-07 DIAGNOSIS — F1020 Alcohol dependence, uncomplicated: Secondary | ICD-10-CM | POA: Insufficient documentation

## 2014-08-07 DIAGNOSIS — E876 Hypokalemia: Secondary | ICD-10-CM | POA: Insufficient documentation

## 2014-08-07 DIAGNOSIS — R1084 Generalized abdominal pain: Secondary | ICD-10-CM | POA: Insufficient documentation

## 2014-08-07 DIAGNOSIS — R17 Unspecified jaundice: Secondary | ICD-10-CM

## 2014-08-07 DIAGNOSIS — E079 Disorder of thyroid, unspecified: Secondary | ICD-10-CM | POA: Insufficient documentation

## 2014-08-07 DIAGNOSIS — R109 Unspecified abdominal pain: Secondary | ICD-10-CM | POA: Insufficient documentation

## 2014-08-07 DIAGNOSIS — K838 Other specified diseases of biliary tract: Secondary | ICD-10-CM

## 2014-08-07 LAB — COMPREHENSIVE METABOLIC PANEL
ALT: 86 U/L — ABNORMAL HIGH (ref 0–55)
ALT: 68 U/L — ABNORMAL HIGH (ref 0–55)
AST (SGOT): 352 U/L — ABNORMAL HIGH (ref 10–42)
AST (SGOT): 249 U/L — ABNORMAL HIGH (ref 10–42)
Albumin/Globulin Ratio: 0.79 Ratio (ref 0.70–1.50)
Albumin/Globulin Ratio: 0.83 Ratio (ref 0.70–1.50)
Albumin: 3.7 gm/dL (ref 3.5–5.0)
Albumin: 3.5 gm/dL (ref 3.5–5.0)
Alkaline Phosphatase: 221 U/L — ABNORMAL HIGH (ref 40–145)
Alkaline Phosphatase: 190 U/L — ABNORMAL HIGH (ref 40–145)
Anion Gap: 26.5 mMol/L — ABNORMAL HIGH (ref 7.0–18.0)
Anion Gap: 15.8 mMol/L (ref 7.0–18.0)
BUN / Creatinine Ratio: 8.9 Ratio — ABNORMAL LOW (ref 10.0–30.0)
BUN / Creatinine Ratio: 14.1 Ratio (ref 10.0–30.0)
BUN: 8 mg/dL (ref 7–22)
BUN: 11 mg/dL (ref 7–22)
Bilirubin, Total: 3.4 mg/dL — ABNORMAL HIGH (ref 0.1–1.2)
Bilirubin, Total: 2.5 mg/dL — ABNORMAL HIGH (ref 0.1–1.2)
CO2: 20 mMol/L (ref 20.0–30.0)
CO2: 24.8 mMol/L (ref 20.0–30.0)
Calcium: 9.6 mg/dL (ref 8.5–10.5)
Calcium: 9 mg/dL (ref 8.5–10.5)
Chloride: 97 mMol/L — ABNORMAL LOW (ref 98–110)
Chloride: 102 mMol/L (ref 98–110)
Creatinine: 0.9 mg/dL (ref 0.60–1.30)
Creatinine: 0.78 mg/dL — ABNORMAL LOW (ref 0.80–1.30)
EGFR: >60 mL/min/{1.73_m2}
EGFR: >60 mL/min/{1.73_m2}
Globulin: 4.7 gm/dL — ABNORMAL HIGH (ref 2.0–4.0)
Globulin: 4.2 gm/dL — ABNORMAL HIGH (ref 2.0–4.0)
Glucose: 118 mg/dL — ABNORMAL HIGH (ref 70–99)
Glucose: 96 mg/dL (ref 70–99)
Osmolality Calc: 279 mOsm/kg (ref 275–300)
Osmolality Calc: 277 mOsm/kg (ref 275–300)
Potassium: 3.5 mMol/L (ref 3.5–5.3)
Potassium: 3.6 mMol/L (ref 3.5–5.3)
Protein, Total: 8.4 gm/dL — ABNORMAL HIGH (ref 6.0–8.3)
Protein, Total: 7.7 gm/dL (ref 6.0–8.3)
Sodium: 140 mMol/L (ref 136–147)
Sodium: 139 mMol/L (ref 136–147)

## 2014-08-07 LAB — CBC AND DIFFERENTIAL
Basophils %: 0.2 % (ref 0.0–3.0)
Basophils Absolute: 0 10*3/uL (ref 0.0–0.3)
Eosinophils %: 0 % (ref 0.0–7.0)
Eosinophils Absolute: 0 10*3/uL (ref 0.0–0.8)
Hematocrit: 40.5 % (ref 39.0–52.5)
Hemoglobin: 13.6 gm/dL (ref 13.0–17.5)
Lymphocytes Absolute: 0.7 10*3/uL (ref 0.6–5.1)
Lymphocytes: 9.7 % — ABNORMAL LOW (ref 15.0–46.0)
MCH: 36 pg — ABNORMAL HIGH (ref 28–35)
MCHC: 34 gm/dL (ref 33–37)
MCV: 107 fL — ABNORMAL HIGH (ref 80–100)
MPV: 10.7 fL (ref 8.0–12.0)
Monocytes Absolute: 0.9 10*3/uL (ref 0.1–1.7)
Monocytes: 11.5 % (ref 3.0–15.0)
Neutrophils %: 78.6 % — ABNORMAL HIGH (ref 42.0–78.0)
Neutrophils Absolute: 5.8 10*3/uL (ref 1.7–8.6)
PLT CT: 82 10*3/uL — ABNORMAL LOW (ref 130–440)
RBC: 3.8 10*6/uL — ABNORMAL LOW (ref 4.00–5.70)
RDW: 12.9 % (ref 12.0–15.0)
WBC: 7.4 10*3/uL (ref 4.0–11.0)

## 2014-08-07 LAB — URINALYSIS, REFLEX TO MICROSCOPIC EXAM IF INDICATED
Bilirubin, UA: NEGATIVE mg/dL
Blood, UA: NEGATIVE mg/dL
Glucose, UA: NEGATIVE mg/dL
Ictotest: NEGATIVE
Nitrite, UA: POSITIVE — AB
Urine Specific Gravity: 1.031 (ref 1.001–1.040)
Urobilinogen, UA: 2 mg/dL — AB
pH, Urine: 6.5 pH (ref 5.0–8.0)

## 2014-08-07 LAB — LIPASE: Lipase: 31 U/L (ref 8–78)

## 2014-08-07 LAB — MAGNESIUM: Magnesium: 1.6 mg/dL (ref 1.6–2.6)

## 2014-08-07 LAB — AMYLASE: Amylase: 59 U/L (ref 30–135)

## 2014-08-07 MED ORDER — ENOXAPARIN SODIUM 40 MG/0.4ML SC SOLN
40.0000 mg | SUBCUTANEOUS | Status: DC
Start: 2014-08-07 — End: 2014-08-08
  Administered 2014-08-07: 40 mg via SUBCUTANEOUS
  Filled 2014-08-07 (×2): qty 0.4

## 2014-08-07 MED ORDER — ONDANSETRON HCL 4 MG/2ML IJ SOLN
INTRAMUSCULAR | Status: AC
Start: 2014-08-07 — End: ?
  Filled 2014-08-07: qty 2

## 2014-08-07 MED ORDER — HYDROMORPHONE HCL 1 MG/ML IJ SOLN
INTRAMUSCULAR | Status: AC
Start: 2014-08-07 — End: ?
  Filled 2014-08-07: qty 1

## 2014-08-07 MED ORDER — ONDANSETRON HCL 4 MG/2ML IJ SOLN
4.0000 mg | Freq: Once | INTRAMUSCULAR | Status: AC
Start: 2014-08-07 — End: 2014-08-07
  Administered 2014-08-07: 4 mg via INTRAVENOUS

## 2014-08-07 MED ORDER — LORAZEPAM 2 MG/ML IJ SOLN
2.0000 mg | INTRAMUSCULAR | Status: DC | PRN
Start: 2014-08-07 — End: 2014-08-08

## 2014-08-07 MED ORDER — VH HYDROMORPHONE HCL PF 1 MG/ML CARPUJECT
0.4000 mg | INTRAMUSCULAR | Status: DC | PRN
Start: 2014-08-07 — End: 2014-08-08
  Administered 2014-08-07 – 2014-08-08 (×6): 0.4 mg via INTRAVENOUS
  Filled 2014-08-07 (×7): qty 1

## 2014-08-07 MED ORDER — SODIUM CHLORIDE 0.9 % IJ SOLN
40.0000 mg | Freq: Every day | INTRAVENOUS | Status: DC
Start: 2014-08-07 — End: 2014-08-08
  Administered 2014-08-07 – 2014-08-08 (×2): 40 mg via INTRAVENOUS
  Filled 2014-08-07 (×2): qty 40

## 2014-08-07 MED ORDER — ACETAMINOPHEN 325 MG PO TABS
650.0000 mg | ORAL_TABLET | ORAL | Status: DC | PRN
Start: 2014-08-07 — End: 2014-08-08

## 2014-08-07 MED ORDER — BACITRACIN ZINC 500 UNIT/GM EX OINT
TOPICAL_OINTMENT | Freq: Every day | CUTANEOUS | Status: DC
Start: 2014-08-07 — End: 2014-08-08
  Administered 2014-08-08: 28.4 g via TOPICAL
  Filled 2014-08-07: qty 28.35

## 2014-08-07 MED ORDER — THIAMINE HCL 100 MG/ML IJ SOLN
100.0000 mL/h | Freq: Every day | INTRAVENOUS | Status: DC
Start: 2014-08-07 — End: 2014-08-08
  Filled 2014-08-07 (×2): qty 1000

## 2014-08-07 MED ORDER — IOHEXOL 300 MG/ML IJ SOLN
100.0000 mL | Freq: Once | INTRAMUSCULAR | Status: AC | PRN
Start: 2014-08-07 — End: 2014-08-07
  Administered 2014-08-07: 100 mL via INTRAVENOUS
  Filled 2014-08-07: qty 100

## 2014-08-07 MED ORDER — DICYCLOMINE HCL 10 MG PO CAPS
10.0000 mg | ORAL_CAPSULE | Freq: Four times a day (QID) | ORAL | Status: DC
Start: 2014-08-07 — End: 2014-08-08
  Administered 2014-08-07 – 2014-08-08 (×3): 10 mg via ORAL
  Filled 2014-08-07 (×5): qty 1

## 2014-08-07 MED ORDER — ACETAMINOPHEN 160 MG/5ML PO SOLN
650.0000 mg | ORAL | Status: DC | PRN
Start: 2014-08-07 — End: 2014-08-08
  Filled 2014-08-07: qty 20.3

## 2014-08-07 MED ORDER — SODIUM CHLORIDE 0.9 % IV SOLN
INTRAVENOUS | Status: DC
Start: 2014-08-07 — End: 2014-08-08

## 2014-08-07 MED ORDER — ONDANSETRON HCL 4 MG/2ML IJ SOLN
4.0000 mg | INTRAMUSCULAR | Status: DC | PRN
Start: 2014-08-07 — End: 2014-08-08
  Administered 2014-08-07 – 2014-08-08 (×3): 4 mg via INTRAVENOUS
  Filled 2014-08-07 (×3): qty 2

## 2014-08-07 MED ORDER — VH HYDROMORPHONE HCL PF 1 MG/ML CARPUJECT
0.5000 mg | Freq: Once | INTRAMUSCULAR | Status: AC
Start: 2014-08-07 — End: 2014-08-07
  Administered 2014-08-07: 0.5 mg via INTRAVENOUS

## 2014-08-07 MED ORDER — VH HYDROMORPHONE HCL PF 1 MG/ML CARPUJECT
1.0000 mg | Freq: Once | INTRAMUSCULAR | Status: AC
Start: 2014-08-07 — End: 2014-08-07
  Administered 2014-08-07: 1 mg via INTRAVENOUS

## 2014-08-07 MED ORDER — ACETAMINOPHEN 650 MG RE SUPP
650.0000 mg | RECTAL | Status: DC | PRN
Start: 2014-08-07 — End: 2014-08-08

## 2014-08-07 MED ORDER — SODIUM CHLORIDE 0.9 % IV BOLUS
2000.0000 mL | Freq: Once | INTRAVENOUS | Status: AC
Start: 2014-08-07 — End: 2014-08-07
  Administered 2014-08-07: 2000 mL via INTRAVENOUS
  Filled 2014-08-07: qty 2000

## 2014-08-07 NOTE — ED Notes (Signed)
Pt c/o of n/v x4 days.

## 2014-08-07 NOTE — Consults (Signed)
CONSULTATION ER    Gregory [name], Gregory [name]   MRN: [id number]  DOB: 13-May-1976  Admission Date: 08/07/2014  Date Time: 08/07/2014 1:33 PM  Requesting Physician: Nicolasa Ducking,*        Note by: Doylene Canard, MD    Reason for Consultation:   Mr. Gregory [name] is a 38 y.o. male who is referred for abdominal pain, nausea, and vomiting.    Assessment:        39 yo male alcoholic with 3 and 1/2 days of mid abdominal pain and nausea and vomiting.  Possible etiologies include gastritis, gastroenteritis, and exacerbation of chronic pancreatitis.  However, lipase and amylase are normal.  He also has evidence of chronic liver disease with possibly early cirrhosis.  There may be an acute exacerbation of the liver disease given the elevated bilirubin.  Also distal CBD pathology (tumor etc.) needs to be ruled out give the dilated duct at 1 cm.  Doubt any significant cholecystitis.    Plan:        Recommend sympomatic treatment at this point that could be done as outpatient if the patient can tolerate po with medication.  Will need follow up for his liver disease.    History of Present Illness:   [name] is a 38 y.o. male who presents with 3 and 1/2 days of abdominal pain with nausea and vomiting.  He notes onset 4 nights ago of mid abdominal pain (points to area around umbilicus) that comes and goes somewhat and is sharp that was associated with nausea and vomiting.  The nausea and vomiting improved yesterday and then worsened today.  The pain has not changed and does not improve with vomiting.  He had a normal BM yesterday and no diarrhea.  The pain does not radiate.  He is best sitting.  No similar pain in the past.  No hematemesis.    He does drink heavily with at least 6 to 8 beers per day.  He had about 3 beers prior to the pain starting.  He has been a heavier drinker in the past with use of one fifth per day a few years ago.      Past Medical History:   He  has a past medical history of Thyroid  disease.    Past Surgical History:   He  has past surgical history that includes Appendectomy and Knee surgery.    Family History:   His family history includes Diabetes in his father, maternal grandfather, maternal grandmother, mother, paternal grandfather, and paternal grandmother.    Social History:   He  reports that he has been smoking.  He has never used smokeless tobacco. He reports that he drinks about 25.2 oz of alcohol per week. He reports that he does not use illicit drugs.    Allergies:   He is allergic to penicillins.    Medications:   Scheduled Meds:     PRN Meds:        Home Meds:  Prior to Admission medications    Not on File       Review of Systems:   Constitutional: No fever (measured just over 99).  HEENT: No jaundice in the past.  Card: No chest pain.  Resp: No dyspnea.  GI: As noted.  GU: No dysuria.  Musculoskeletal: No weakness or aches recently.  Skin: No rash.  Neurologic: negative.  Endocrine: negative.    Physical Exam:     Filed Vitals:    08/07/14 1235  BP: 150/83   Pulse: 119   Temp:    Resp:    SpO2:      83.915 kg (185 lb)   1.93 m (6' 3.98")  Body mass index is 22.53 kg/(m^2).     General: awake, alert, oriented x 3; no acute distress.  Eyes: Non-icteric.  HENT: AT/NC  Neck: supple, no masses, no adenopathy, no thyromegaly, no bruits.  Cardiovascular: regular rate and rhythm, no murmurs, rubs or gallops  Lungs: clear to auscultation bilaterally, without wheezing, rhonchi, or rales.  Abdomen: Flat.  Good BS.  Soft but with some mild diffuse tenderness more in the lower quodrants.  Minimal guarding.  No rebound.  No masses.  Some hepatomegaly with liver edge about 2 inches below costal margin and mildly tender.  No splenomegaly.    Extremities: No cyanosis or edema.  Skin: No rash.   Neurologic/Psychiatric:  Alert and oriented.        Labs Reviewed:       Results     Procedure Component Value Units Date/Time    Amylase [[id number]] Collected:  08/07/14 0752    Specimen Information:   Blood / Plasma Updated:  08/07/14 1102     Amylase 59 U/L     UA, Reflex to Microscopic [[id number]]  (Abnormal) Collected:  08/07/14 0959    Specimen Information:  Urine / Urine, Random Updated:  08/07/14 1045     Color, UA Orange - Color may affect some analytes (A)      Clarity, UA Clear      Specific Gravity, UR 1.031      pH, Urine 6.5 pH      Protein, UR 1+ (A) mg/dL      Glucose, UA Negative mg/dL      Ketones UA 1+ (A)      Bilirubin, UA Negative mg/dL      Blood, UA Negative mg/dL      Nitrite, UA Positive (A)      Urobilinogen, UA 2.0 (A) mg/dL      Leukocyte Esterase, UA 1+ (A) Leu/uL      UR Micro Performed      WBC, UA 1-2 /hpf      RBC, UA 1-2 /hpf      Bacteria, UA Rare (A) /hpf      Squam Epithel, UA 1-5 /lpf      Trans Epithel, UA 0-2 /hpf      Ictotest Negative      Hyaline Casts, UA 3-5 (A) /lpf     CBC [[id number]]  (Abnormal) Collected:  08/07/14 0752    Specimen Information:  Blood / Blood Updated:  08/07/14 0833     WBC 7.4 K/cmm      RBC 3.80 (L) M/cmm      Hemoglobin 13.6 gm/dL      Hematocrit 27.7 %      MCV 107 (H) fL      MCH 36 (H) pg      MCHC 34 gm/dL      RDW 82.4 %      PLT CT 82 (L) K/cmm      MPV 10.7 fL      NEUTROPHIL % 78.6 (H) %      Lymphocytes 9.7 (L) %      Monocytes 11.5 %      Eosinophils % 0.0 %      Basophils % 0.2 %      Neutrophils Absolute 5.8 K/cmm      Lymphocytes Absolute  0.7 K/cmm      Monocytes Absolute 0.9 K/cmm      Eosinophils Absolute 0.0 K/cmm      BASO Absolute 0.0 K/cmm      Comment: LARGE PLATELET SEEN     CMP [[id number]]  (Abnormal) Collected:  08/07/14 0752    Specimen Information:  Blood / Plasma Updated:  08/07/14 0832     Sodium 140 mMol/L      Potassium 3.5 mMol/L      Chloride 97 (L) mMol/L      CO2 20.0 mMol/L      CALCIUM 9.6 mg/dL      Glucose 409 (H) mg/dL      Creatinine 8.11 mg/dL      BUN 8 mg/dL      Protein, Total 8.4 (H) gm/dL      Albumin 3.7 gm/dL      Alkaline Phosphatase 221 (H) U/L      ALT 86 (H) U/L      AST (SGOT) 352 (H) U/L       Bilirubin, Total 3.4 (H) mg/dL      Albumin/Globulin Ratio 0.79 Ratio      Anion Gap 26.5 (H) mMol/L      BUN/Creatinine Ratio 8.9 (L) Ratio      EGFR >60 mL/min/1.33m2      Osmolality Calc 279 mOsm/kg      Globulin 4.7 (H) gm/dL     Lipase [[id number]] Collected:  08/07/14 0752    Specimen Information:  Blood / Plasma Updated:  08/07/14 0832     Lipase 31 U/L           Rads:     Ct Abdomen Pelvis With Iv Cont    08/07/2014   1.  Distended gallbladder with inflammatory changes in the porta hepatis region. There are also mildly prominent lymph nodes. Findings may be secondary to cholecystitis. Given the proximity to the pancreatic head, pancreatitis is not entirely excluded. Recommend correlation with biochemical profile. For persistent concern for cholecystitis, consider right upper quadrant ultrasound.  2.  Hepatic steatosis.  3.  Multiple prominent collateral vessels in the right pararenal region of uncertain etiology. ReadingStation:[id number]    US Abdomen Limited Ruq    08/07/2014   Impression:  1.  No cholelithiasis or evidence of acute cholecystitis.  2.  The common bile duct appears dilated. No stones within the visualized portions of the common bile duct. There may be an obstructing stone or extrinsic mass.  3.  Hepatic steatosis.  ReadingStation:[id number]        Signed by: Doylene Canard, MD

## 2014-08-07 NOTE — Progress Notes (Signed)
Pt resting in bed with c/o abd pain 8/10 with dry heaves. Pt scheduled for MRI with contrast at this time. PRN pain med and antinausea meds administered prior to transport. MRI sheet completed and sent with pt. Will monitor for return.

## 2014-08-07 NOTE — H&P (Addendum)
[facility]      Patient: Gregory [name]  Date: 08/07/2014   DOB: 1976-01-11  Admission Date: 08/07/2014   MRN: [id number]  Attending: Karren Cobble, MD      PRIMARY CARE MD: Christa See, MD      HISTORY AND PHYSICAL     Chief  Complaint:  Abdominal pain, nausea and vomiting of 4 days duration.    History of Present Illness:    Gregory [name] is a 38 y.o. male with a past medical history of thyroid disease transferred from [facility] for MRCP. Patient has been vomiting almost 15 times for the last 4 days. It started with abdominal pain like a horse kicking his gut ,located at his umblicus, continuous, 10 out of 10 in intensity and doesn't admit to anywhere. Admitted for drinking beer, 6 bottles every day until he becomes sick 4 days ago. He went to [facility] and had CT scan of the abdomen showed distended gallbladder with inflammatory changes. He also had a stress found of the abdomen which showed dilated common bile duct but no cholecystitis. Patient still continued to have abdominal pain, nausea and vomiting.Dr.Frelich called me to evaluate his common bile duct was MRCP. His AST 352 and ALT is 86.          Past Medical History   Diagnosis Date   . Thyroid disease        Past Surgical History   Procedure Laterality Date   . Appendectomy     . Knee surgery       Hardware placement R knee       Allergies   Allergen Reactions   . Penicillins Hives       Prior to Admission medications    Not on File       History   Substance Use Topics   . Smoking status: Current Every Day Smoker -- 0.50 packs/day   . Smokeless tobacco: Never Used   . Alcohol Use: 25.2 oz/week     42 Cans of beer per week      Comment: daily         Family History   Problem Relation Age of Onset   . Diabetes Mother    . Diabetes Father    . Diabetes Maternal Grandmother    . Diabetes Maternal Grandfather    . Diabetes Paternal Grandmother    . Diabetes Paternal Grandfather          REVIEW OF  SYSTEMS     Ten systems reviewed and are  negative except those mentioned in the history of present illness..    PHYSICAL EXAM     Vital Signs (most recent): BP 155/93 mmHg  Pulse 108  Temp(Src) 98.3 F (36.8 C) (Oral)  Resp 18  Ht 1.93 m (6\' 4" )  Wt 72.893 kg (160 lb 11.2 oz)  BMI 19.57 kg/m2  SpO2 100%  General:  Patient is comfortable and not in distress.   HEENT:  PERRL, non  Icteric sclera , pink conjunctiva.  Cardiovascular :S1 S 2 well heard, no murmurs or gallops  Respiratory:  Clear to auscultation and percussion bilaterally.  Gastrointestinal: Normal bowel sounds, soft, non-tender, no organomegaly. Tenderness but no guarding.  Genitourinary: no suprapubic or costovertebral angle tenderness  Musculoskeletal: Laceration on his right medial malleolus.  Skin: no rashes or echymosis  Neurologic: Patient is alert,oriented to time place and person. No  gross motor or sensory deficits, patellar and bicep DTR  2+ and symmetric  Psychiatric: alert, interactive, appropriate, normal affect    LABS & IMAGING       Recent Labs  Lab 08/07/14  0752   WBC 7.4   RBC 3.80*   HEMOGLOBIN 13.6   HEMATOCRIT 40.5   MCV 107*   PLATELETS 82*       Recent Labs  Lab 08/07/14  0752   SODIUM 140   POTASSIUM 3.5   CHLORIDE 97*   CO2 20.0   BUN 8   CREATININE 0.90   GLUCOSE 118*   CALCIUM 9.6     No results for input(s): OSMOLUR, NAUR in the last 168 hours.    Invalid input(s): OSMOL    Recent Labs  Lab 08/07/14  0752   ALT 86*   AST (SGOT) 352*   BILIRUBIN, TOTAL 3.4*   ALBUMIN 3.7   ALKALINE PHOSPHATASE 221*     No results for input(s): CK, CKMB, CKMBINDEX, TROPI in the last 168 hours.  No results for input(s): INR, PT, PTT in the last 168 hours.           IMAGING:  MRI ABDOMEN W CONTRAST MRCP    (Results Pending)       CARDIAC:  EKG Interpretation:     Markers:  No results for input(s): CK, CKMB, CKMBINDEX, TROPI in the last 168 hours.    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 , I discussed with him to stop drinking in order his hepatitis to get better other wise it will get worse.His hepatitis is not sever which need to be treated with prednisone.    --Alcohol abuse;  CIWA protocol for possible alcohol withdrawal syndrome    --Thyroid disorder; TSH    --Laceration on the right medial malleolus; bacitracin dressing    DVT/VTE Prophylaxis: Lovenox    Karren Cobble, MD  08/07/2014 7:29 PM

## 2014-08-07 NOTE — ED Notes (Signed)
Pt requesting more pain medication.

## 2014-08-07 NOTE — ED Provider Notes (Addendum)
Physician/Midlevel provider first contact with patient: 08/07/14 0732         History     Chief Complaint   Patient presents with   . Abdominal Pain   . Emesis     Patient is a 38 y.o. male presenting with abdominal pain and vomiting. The history is provided by the patient.   Abdominal Pain  Associated symptoms: chills, nausea and vomiting    Associated symptoms: no constipation, no diarrhea, no fatigue and no fever    Emesis  Associated symptoms: abdominal pain and chills    Associated symptoms: no diarrhea    Gregory [name] reports that he has had lower abdominal pain since Monday, with vomiting 15 times a day (last BM Tuesday was normal) that has been gradually worsening.  No fever.  He is s/p appy.  He admits to drinking a 6-pack of beer daily.  No back pain, denies urine symptoms, has some appetite but can't keep anything down by his report.  No cough or chest pain or sob.  No history of similar symptoms.  Nothing particularly increases or decreases pain.  No swelling or pain of scrotum, penis, or inguinal area.      Past Medical History   Diagnosis Date   . Thyroid disease        Past Surgical History   Procedure Laterality Date   . Appendectomy     . Knee surgery       Hardware placement R knee       Family History   Problem Relation Age of Onset   . Diabetes Mother    . Diabetes Father    . Diabetes Maternal Grandmother    . Diabetes Maternal Grandfather    . Diabetes Paternal Grandmother    . Diabetes Paternal Grandfather        Social  History   Substance Use Topics   . Smoking status: Current Every Day Smoker -- 0.50 packs/day   . Smokeless tobacco: Never Used   . Alcohol Use: 25.2 oz/week     42 Cans of beer per week      Comment: daily       .     Allergies   Allergen Reactions   . Penicillins Hives       Current/Home Medications    No medications on file        Review of Systems   Constitutional: Positive for chills, activity change and appetite change. Negative for fever, diaphoresis, fatigue and  unexpected weight change.   HENT: Negative.    Eyes: Negative.    Respiratory: Negative.    Gastrointestinal: Positive for nausea, vomiting and abdominal pain. Negative for diarrhea, constipation, blood in stool, abdominal distention, anal bleeding and rectal pain.   Endocrine: Negative.    Genitourinary: Negative.    Musculoskeletal: Negative.    Skin: Negative.    Allergic/Immunologic: Negative.    Neurological: Negative.    Hematological: Negative.    Psychiatric/Behavioral: Negative.    All other systems reviewed and are negative.      Physical Exam    BP: 149/85 mmHg, Heart Rate: (!) 133, Temp: 98.6 F (37 C), Resp Rate: 20, SpO2: 100 %, Weight: 83.915 kg    Physical Exam   Constitutional: He is oriented to person, place, and time. He appears well-developed and well-nourished.   Appears nauseated and uncomfortable     HENT:   Head: Normocephalic and atraumatic.   Mouth/Throat: Oropharynx is clear and moist.  Eyes: EOM are normal. Pupils are equal, round, and reactive to light.   Cardiovascular: Regular rhythm and normal heart sounds.    Tachycardic     Pulmonary/Chest: Effort normal and breath sounds normal.   Abdominal: Soft. He exhibits no distension and no mass. There is tenderness. There is no rebound and no guarding. No hernia.   Genitourinary:   Penis and scrotum normal with no hernia.     Musculoskeletal: Normal range of motion.   Lymphadenopathy:     He has no cervical adenopathy.   Neurological: He is alert and oriented to person, place, and time.   Skin: No rash noted.   Psychiatric: He has a normal mood and affect. His behavior is normal.   Nursing note and vitals reviewed.      Results     Procedure Component Value Units Date/Time    Amylase [[id number]] Collected:  08/07/14 0752    Specimen Information:  Blood / Plasma Updated:  08/07/14 1102     Amylase 59 U/L     UA, Reflex to Microscopic [[id number]]  (Abnormal) Collected:  08/07/14 0959    Specimen Information:  Urine / Urine, Random Updated:   08/07/14 1045     Color, UA Orange - Color may affect some analytes (A)      Clarity, UA Clear      Specific Gravity, UR 1.031      pH, Urine 6.5 pH      Protein, UR 1+ (A) mg/dL      Glucose, UA Negative mg/dL      Ketones UA 1+ (A)      Bilirubin, UA Negative mg/dL      Blood, UA Negative mg/dL      Nitrite, UA Positive (A)      Urobilinogen, UA 2.0 (A) mg/dL      Leukocyte Esterase, UA 1+ (A) Leu/uL      UR Micro Performed      WBC, UA 1-2 /hpf      RBC, UA 1-2 /hpf      Bacteria, UA Rare (A) /hpf      Squam Epithel, UA 1-5 /lpf      Trans Epithel, UA 0-2 /hpf      Ictotest Negative      Hyaline Casts, UA 3-5 (A) /lpf     CBC [[id number]]  (Abnormal) Collected:  08/07/14 0752    Specimen Information:  Blood / Blood Updated:  08/07/14 0833     WBC 7.4 K/cmm      RBC 3.80 (L) M/cmm      Hemoglobin 13.6 gm/dL      Hematocrit 08.6 %      MCV 107 (H) fL      MCH 36 (H) pg      MCHC 34 gm/dL      RDW 57.8 %      PLT CT 82 (L) K/cmm      MPV 10.7 fL      NEUTROPHIL % 78.6 (H) %      Lymphocytes 9.7 (L) %      Monocytes 11.5 %      Eosinophils % 0.0 %      Basophils % 0.2 %      Neutrophils Absolute 5.8 K/cmm      Lymphocytes Absolute 0.7 K/cmm      Monocytes Absolute 0.9 K/cmm      Eosinophils Absolute 0.0 K/cmm      BASO Absolute 0.0 K/cmm  Comment: LARGE PLATELET SEEN     CMP [[id number]]  (Abnormal) Collected:  08/07/14 0752    Specimen Information:  Blood / Plasma Updated:  08/07/14 0832     Sodium 140 mMol/L      Potassium 3.5 mMol/L      Chloride 97 (L) mMol/L      CO2 20.0 mMol/L      CALCIUM 9.6 mg/dL      Glucose 213 (H) mg/dL      Creatinine 0.86 mg/dL      BUN 8 mg/dL      Protein, Total 8.4 (H) gm/dL      Albumin 3.7 gm/dL      Alkaline Phosphatase 221 (H) U/L      ALT 86 (H) U/L      AST (SGOT) 352 (H) U/L      Bilirubin, Total 3.4 (H) mg/dL      Albumin/Globulin Ratio 0.79 Ratio      Anion Gap 26.5 (H) mMol/L      BUN/Creatinine Ratio 8.9 (L) Ratio      EGFR >60 mL/min/1.47m2      Osmolality Calc 279  mOsm/kg      Globulin 4.7 (H) gm/dL     Lipase [[id number]] Collected:  08/07/14 0752    Specimen Information:  Blood / Plasma Updated:  08/07/14 0832     Lipase 31 U/L         Ct Abdomen Pelvis With Iv Cont    08/07/2014   1.  Distended gallbladder with inflammatory changes in the porta hepatis region. There are also mildly prominent lymph nodes. Findings may be secondary to cholecystitis. Given the proximity to the pancreatic head, pancreatitis is not entirely excluded. Recommend correlation with biochemical profile. For persistent concern for cholecystitis, consider right upper quadrant ultrasound.  2.  Hepatic steatosis.  3.  Multiple prominent collateral vessels in the right pararenal region of uncertain etiology. ReadingStation:[id number]    US Abdomen Limited Ruq    08/07/2014   Impression:  1.  No cholelithiasis or evidence of acute cholecystitis.  2.  The common bile duct appears dilated. No stones within the visualized portions of the common bile duct. There may be an obstructing stone or extrinsic mass.  3.  Hepatic steatosis.  ReadingStation:[id number]      MDM and ED Course   900 - Feeling much better and appears much better.  Labs are normal, awaiting CT    1237 - Resting comfortably - discussed findings with radiologist who says on [place] CBD is 1cm but no cholelithiasis and no evidence for pancreatitis.  No gb wall thickening.  He has elevated LFTS but drinks nightly.  I am calling Dr. Zollie Beckers to discuss.    1250 - Dr. Zollie Beckers is not on call but I told her about patient and she advised me to get Dr. Faylene Million, who is on call, to see patient, I have called him and he is coming.    1536 - Dr. Faylene Million saw patient and said that if he could pass a po challenge and pain was eased he could go home but otherwise should be transferred to [facility] for MRCP and GI workup.  He failed an attempt with po fluids (vomited) and pain returned.  I am therefore calling [facility] to transfer patient, who is in agreement with plan.    ED  Medication Orders     Start     Status Ordering Provider    08/07/14 1510  ondansetron (ZOFRAN) injection 4 mg  Once in ED     Route: Intravenous  Ordered Dose: 4 mg     Last MAR action:  Given Nicolasa Ducking    08/07/14 1510  HYDROmorphone (DILAUDID) injection 0.5 mg   Once in ED     Route: Intravenous  Ordered Dose: 0.5 mg     Last MAR action:  Given Nicolasa Ducking    08/07/14 1312  HYDROmorphone (DILAUDID) injection 0.5 mg   Once in ED     Route: Intravenous  Ordered Dose: 0.5 mg     Last MAR action:  Given Antwain Caliendo, Al Corpus    08/07/14 1312  ondansetron (ZOFRAN) injection 4 mg   Once in ED     Route: Intravenous  Ordered Dose: 4 mg     Last MAR action:  Given Nicolasa Ducking    08/07/14 1610  HYDROmorphone (DILAUDID) injection 0.5 mg   Once in ED     Route: Intravenous  Ordered Dose: 0.5 mg     Last MAR action:  Given Jeriah Skufca, Al Corpus    08/07/14 0935  ondansetron (ZOFRAN) injection 4 mg   Once in ED     Route: Intravenous  Ordered Dose: 4 mg     Last MAR action:  Given Ora Bollig, Al Corpus    08/07/14 0745  sodium chloride 0.9 % bolus 2,000 mL   Once in ED     Route: Intravenous  Ordered Dose: 2,000 mL     Last MAR action:  Stopped Elain Wixon, Al Corpus    08/07/14 0745  ondansetron (ZOFRAN) injection 4 mg   Once in ED     Route: Intravenous  Ordered Dose: 4 mg     Last MAR action:  Given Luan Maberry, Al Corpus    08/07/14 0745  HYDROmorphone (DILAUDID) injection 1 mg   Once in ED     Route: Intravenous  Ordered Dose: 1 mg     Last MAR action:  Given Louetta Hollingshead, Al Corpus              MDM       Procedures    Clinical Impression & Disposition     Clinical Impression  Final diagnoses:   Generalized abdominal pain   Total bilirubin, elevated   Common bile duct dilatation        ED Disposition     [facility] Transfer to [facility] discharge to home/self care.    Condition at disposition: Stable             New Prescriptions    No medications  on file                   Nicolasa Ducking, MD  08/07/14 1538    Nicolasa Ducking, MD  08/07/14 [phone]

## 2014-08-08 ENCOUNTER — Observation Stay: Payer: Self-pay

## 2014-08-08 DIAGNOSIS — E860 Dehydration: Principal | ICD-10-CM

## 2014-08-08 DIAGNOSIS — F1010 Alcohol abuse, uncomplicated: Secondary | ICD-10-CM

## 2014-08-08 DIAGNOSIS — D696 Thrombocytopenia, unspecified: Secondary | ICD-10-CM

## 2014-08-08 DIAGNOSIS — E876 Hypokalemia: Secondary | ICD-10-CM

## 2014-08-08 DIAGNOSIS — Z833 Family history of diabetes mellitus: Secondary | ICD-10-CM

## 2014-08-08 DIAGNOSIS — F1020 Alcohol dependence, uncomplicated: Secondary | ICD-10-CM

## 2014-08-08 DIAGNOSIS — Z880 Allergy status to penicillin: Secondary | ICD-10-CM

## 2014-08-08 DIAGNOSIS — R109 Unspecified abdominal pain: Secondary | ICD-10-CM

## 2014-08-08 DIAGNOSIS — E079 Disorder of thyroid, unspecified: Secondary | ICD-10-CM

## 2014-08-08 DIAGNOSIS — K7010 Alcoholic hepatitis without ascites: Secondary | ICD-10-CM

## 2014-08-08 DIAGNOSIS — F17200 Nicotine dependence, unspecified, uncomplicated: Secondary | ICD-10-CM

## 2014-08-08 DIAGNOSIS — E039 Hypothyroidism, unspecified: Secondary | ICD-10-CM

## 2014-08-08 LAB — COMPREHENSIVE METABOLIC PANEL
ALT: 61 U/L — ABNORMAL HIGH (ref 0–55)
ALT: 64 U/L — ABNORMAL HIGH (ref 0–55)
AST (SGOT): 228 U/L — ABNORMAL HIGH (ref 10–42)
AST (SGOT): 239 U/L — ABNORMAL HIGH (ref 10–42)
Albumin/Globulin Ratio: 0.8 Ratio (ref 0.70–1.50)
Albumin/Globulin Ratio: 0.81 Ratio (ref 0.70–1.50)
Albumin: 3.2 gm/dL — ABNORMAL LOW (ref 3.5–5.0)
Albumin: 3.4 gm/dL — ABNORMAL LOW (ref 3.5–5.0)
Alkaline Phosphatase: 170 U/L — ABNORMAL HIGH (ref 40–145)
Alkaline Phosphatase: 171 U/L — ABNORMAL HIGH (ref 40–145)
Anion Gap: 14.5 mMol/L (ref 7.0–18.0)
Anion Gap: 15.8 mMol/L (ref 7.0–18.0)
BUN / Creatinine Ratio: 16.4 Ratio (ref 10.0–30.0)
BUN / Creatinine Ratio: 17.6 Ratio (ref 10.0–30.0)
BUN: 12 mg/dL (ref 7–22)
BUN: 13 mg/dL (ref 7–22)
Bilirubin, Total: 2.3 mg/dL — ABNORMAL HIGH (ref 0.1–1.2)
Bilirubin, Total: 2.5 mg/dL — ABNORMAL HIGH (ref 0.1–1.2)
CO2: 21.8 mMol/L (ref 20.0–30.0)
CO2: 20.5 mMol/L (ref 20.0–30.0)
Calcium: 8.5 mg/dL (ref 8.5–10.5)
Calcium: 8.5 mg/dL (ref 8.5–10.5)
Chloride: 104 mMol/L (ref 98–110)
Chloride: 103 mMol/L (ref 98–110)
Creatinine: 0.73 mg/dL — ABNORMAL LOW (ref 0.80–1.30)
Creatinine: 0.74 mg/dL — ABNORMAL LOW (ref 0.80–1.30)
EGFR: >60 mL/min/{1.73_m2}
EGFR: >60 mL/min/{1.73_m2}
Globulin: 4 gm/dL (ref 2.0–4.0)
Globulin: 4.2 gm/dL — ABNORMAL HIGH (ref 2.0–4.0)
Glucose: 85 mg/dL (ref 70–99)
Glucose: 72 mg/dL (ref 70–99)
Osmolality Calc: 273 mOsm/kg — ABNORMAL LOW (ref 275–300)
Osmolality Calc: 271 mOsm/kg — ABNORMAL LOW (ref 275–300)
Potassium: 3.3 mMol/L — ABNORMAL LOW (ref 3.5–5.3)
Potassium: 3.3 mMol/L — ABNORMAL LOW (ref 3.5–5.3)
Protein, Total: 7.2 gm/dL (ref 6.0–8.3)
Protein, Total: 7.6 gm/dL (ref 6.0–8.3)
Sodium: 137 mMol/L (ref 136–147)
Sodium: 136 mMol/L (ref 136–147)

## 2014-08-08 LAB — VH URINE DRUG SCREEN
Amphetamine: NEGATIVE
Barbiturates: NEGATIVE
Buprenorphine, Urine: NEGATIVE
Cannabinoids: NEGATIVE
Cocaine: NEGATIVE
Opiates: POSITIVE — AB
Phencyclidine: NEGATIVE
Urine Benzodiazepines: NEGATIVE
Urine Creatinine Random: 523.75 mg/dL
Urine Methadone Screen: NEGATIVE
Urine Oxycodone: NEGATIVE
Urine Specific Gravity: 1.016 (ref 1.001–1.040)
pH, Urine: 5.9 pH (ref 5.0–8.0)

## 2014-08-08 LAB — CBC AND DIFFERENTIAL
Basophils %: 0.5 % (ref 0.0–3.0)
Basophils Absolute: 0 10*3/uL (ref 0.0–0.3)
Eosinophils %: 0.4 % (ref 0.0–7.0)
Eosinophils Absolute: 0 10*3/uL (ref 0.0–0.8)
Hematocrit: 36.6 % — ABNORMAL LOW (ref 39.0–52.5)
Hemoglobin: 12.1 gm/dL — ABNORMAL LOW (ref 13.0–17.5)
Lymphocytes Absolute: 1.7 10*3/uL (ref 0.6–5.1)
Lymphocytes: 28.6 % (ref 15.0–46.0)
MCH: 35 pg (ref 28–35)
MCHC: 33 gm/dL (ref 32–36)
MCV: 107 fL — ABNORMAL HIGH (ref 80–100)
MPV: 10.3 fL — ABNORMAL HIGH (ref 6.0–10.0)
Monocytes Absolute: 0.8 10*3/uL (ref 0.1–1.7)
Monocytes: 13.3 % (ref 3.0–15.0)
Neutrophils %: 57.3 % (ref 42.0–78.0)
Neutrophils Absolute: 3.3 10*3/uL (ref 1.7–8.6)
PLT CT: 72 10*3/uL — ABNORMAL LOW (ref 130–440)
RBC: 3.43 10*6/uL — ABNORMAL LOW (ref 4.00–5.70)
RDW: 11.7 % (ref 11.0–14.0)
WBC: 5.8 10*3/uL (ref 4.0–11.0)

## 2014-08-08 LAB — ECG 12-LEAD
P Wave Axis: 34 deg
P Wave Duration: 112 ms
P-R Interval: 156 ms
Patient Age: 38 years
Q-T Dispersion: 44 ms
Q-T Interval(Corrected): 498 ms
Q-T Interval: 468 ms
QRS Axis: 30 deg
QRS Duration: 104 ms
T Axis: 26 deg
Ventricular Rate: 68 /min

## 2014-08-08 LAB — HEPATIC FUNCTION PANEL
ALT: 66 U/L — ABNORMAL HIGH (ref 0–55)
AST (SGOT): 240 U/L — ABNORMAL HIGH (ref 10–42)
Albumin/Globulin Ratio: 0.88 Ratio (ref 0.70–1.50)
Albumin: 3.5 gm/dL (ref 3.5–5.0)
Alkaline Phosphatase: 172 U/L — ABNORMAL HIGH (ref 40–145)
Bilirubin Direct: 1.6 mg/dL — ABNORMAL HIGH (ref 0.0–0.3)
Bilirubin, Total: 2.7 mg/dL — ABNORMAL HIGH (ref 0.1–1.2)
Globulin: 4 gm/dL (ref 2.0–4.0)
Protein, Total: 7.5 gm/dL (ref 6.0–8.3)

## 2014-08-08 LAB — LIPASE: Lipase: 30 U/L (ref 8–78)

## 2014-08-08 LAB — TSH: TSH: 8.99 u[IU]/mL — ABNORMAL HIGH (ref 0.40–4.20)

## 2014-08-08 MED ORDER — TECHNETIUM TC 99M MEBROFENIN
5.0000 | Freq: Once | Status: AC | PRN
Start: 2014-08-08 — End: 2014-08-08
  Administered 2014-08-08: 5 via INTRAVENOUS

## 2014-08-08 MED ORDER — POTASSIUM CHLORIDE CRYS ER 20 MEQ PO TBCR
40.0000 meq | EXTENDED_RELEASE_TABLET | Freq: Once | ORAL | Status: AC
Start: 2014-08-08 — End: 2014-08-08
  Administered 2014-08-08: 40 meq via ORAL
  Filled 2014-08-08: qty 2

## 2014-08-08 MED ORDER — THIAMINE HCL 100 MG/ML IJ SOLN
100.0000 mL/h | Freq: Once | INTRAVENOUS | Status: AC
Start: 2014-08-08 — End: 2014-08-08
  Administered 2014-08-08: 100 mL/h via INTRAVENOUS
  Filled 2014-08-08: qty 1000

## 2014-08-08 NOTE — Progress Notes (Signed)
Assumed care at 0700. Patient resting in bed, requesting pain medication for abdominal pain 7/10, medicated per mar. Assessment complete, CIWA assessment complete, c/o of intermittent nausea without emesis. IVF infusing at this time. Seizure precautions initiated with padded bed rails and suction at bedside, patient educated on seizure precautions, verbalized understanding. Bed locked in low position, call bell within reach, patient denies other needs at this time, will continue to monitor.

## 2014-08-08 NOTE — Progress Notes (Signed)
Nutrition Therapy  Nutrition Screen    Patient Information:     Name:Gregory [name]   Age: 38 y.o.   Sex: male     MRN: [id number]    Reason For Screen:     Unintentional  weight loss of >= 10 pounds in one month      Nutrition Assessment:     Patient out of room for abdominal scan. Nurse says he is very hungry but MD has not wanted him to have anything since he was admitted.     Weight Monitoring 11/24/2011 02/06/2012 02/13/2012 08/07/2014 08/07/2014   Weight 86.183 kg 90.719 kg 99.791 kg 83.915 kg 72.893 kg     Variable weights, unsure of fluid retention vs. Actually weight loss. Will FU at a later time to discuss with him.    Advance diet as appropriate after testing is complete, discussed with nurse.     Nutrition Risk Level: Moderate    No further recommendations at this time.  RD to follow per policy, nutritional status change or MD Consult      Pecola Lawless, RD  08/08/2014 4:27 PM

## 2014-08-08 NOTE — Discharge Summary (Signed)
AMA summary - [facility]    Patient Name: Gregory [name]  Attending Physician: Rodman Pickle, MD  Primary Care Physician: Christa See, MD    Date of Admission: 08/07/2014  Date of Discharge: 08/08/2014  Length of Stay in the Hospital: 1    Discharge Diagnoses:                                                                         Patient has signed out AGAINST MEDICAL ADVICE.    Abdominal pain with possible cholecystitis workup was not completed.    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    Alcoholic hepatitis.    History of thyroid disease again workup was not completed S patient has sound out AGAINST MEDICAL ADVICE    History of alcohol abuse.     brief hospital course.    38 year old male with past medical history of hypothyroidism presented to the ER at [facility] with nausea vomiting.  Patient has long-standing history of alcoholism. A CAT scan was done at the ER which showed distended gallbladder with inflammatory changes. Patient was transferred to [facility] for MRCP. MRCP was done and it has does not show any CBD obstruction.  CT scan did show concern for cholecystitis as well as pancreatitis.   Ultrasound with no evidence of cholelithiasis or acute cholecystitis.   Patient says his nausea and vomiting are improved. For further evaluation for cholecystitis, HIDA scan was ordered. I explained all the above test results in detail to the patient as well as the need for HIDA scan. Patient went for HIDA scan however over there became agitated.  He wanted to leave the hospital immediately. Patient was brought back to his room. He told the charge nurse as well as his primary nurse that he wants to sign out AGAINST MEDICAL ADVICE.  Primary nurse evaluated the patient. Patient was awake and alert oriented 3 at 4:45 PM. He just says that he did not want to stay in the hospital any longer as he feels fine. I spoke to him over the phone and try to  find out why he wants to leave. Patient told me that he had all the tests that were needed. He also tells me that he feels fine and he doesn't think that he needs to stay in the hospital. He also told me that he will call me back and let me know how he is doing. Patient is oriented 4 he understands his risks of severe morbidity with acute cholecystitis and leaving AGAINST MEDICAL ADVICE. Patient had no ideation to hurt himself or others. Patient has signed out AGAINST MEDICAL ADVICE.    Time spent coordinating discharge and reviewing discharge plan: 35 minutes    Signed by:   Rodman Pickle  08/08/2014  5:05 PM    [facility]  Office number [phone]    CC: Pcp, Noneorunknown, MD

## 2014-08-08 NOTE — Progress Notes (Addendum)
Mr. Guitron arrived to nuc med for his HIDA scan perfectly oriented, and very well mannered.  After approximately 1 hour of imaging, the patient became very confused.  He began asking me questions such as "Where are the other people that were here with me?", "Why don't they all line up?", "Where is my stuff?", and "How did I get here?" stating that he carpooled here with some friends and wondered when they were coming back to get him. He got out of his stretcher, pulled his IV out, ripped his ID and allergy bracelets off, and walked out of the room.  He was crying and his whole body was shaking.  I immediately called his nurse who said that he hadn't been confused like this at all while he was in her care.  I tried to reason with Mr. Lehenbauer to come back into the imaging room and wait for his nurse to come downstairs and she would take him back up to the room.  He refused, and wanted to pace the halls instead.  He knew he was in the hospital; he knew it was Friday.  He apologized profusely about the test-saying that he just couldn't do it today, maybe another day.  Transport and the charge nurse arrived and escorted the patient back to his room.  I will process and turn in the images that I have acquired, which is not a complete study.

## 2014-08-08 NOTE — UM Notes (Signed)
[facility] Utilization Management Review Sheet    NAME: Gregory [name]  MR#: [id number]    CSN#: [id number]    ROOM: 514/514-A AGE: 38 y.o.    ADMIT DATE AND TIME: 08/07/2014  6:11 PM      PATIENT CLASS:    ATTENDING PHYSICIAN: Hailesilasie, Dereje D, *  PAYOR:Payor: /       AUTH #:     DIAGNOSIS:   HISTORY:   Past Medical History   Diagnosis Date   . Thyroid disease        DATE OF REVIEW: 08/08/2014    VITALS: BP 135/94 mmHg  Pulse 90  Temp(Src) 97.8 F (36.6 C) (Axillary)  Resp 20  Ht 1.93 m (6\' 4" )  Wt 72.893 kg (160 lb 11.2 oz)  BMI 19.57 kg/m2  SpO2 100%    ED Treatment     Admission Review  History   Patient has been vomiting almost 15 times for the last 4 days. It started with abdominal pain like a horse kicking his gut ,located at his umblicus, continuous, 10 out of 10 in intensity and doesn't admit to anywhere. Admitted for drinking beer, 6 bottles every day until he becomes sick 4 days ago. He went to [facility] and had CT scan of the abdomen showed distended gallbladder with inflammatory changes. He also had a stress found of the abdomen which showed dilated common bile duct but no cholecystitis. Patient still continued to have abdominal pain, nausea and vomiting   VS  155/93, 108, 98.3, 100%  Labs  Alt 86, ast 352, alkaline phos 221  Imagining   CT of abd  IMPRESSION:   1. Distended gallbladder with inflammatory changes in the porta hepatis region. There are also mildly prominent lymph  nodes. Findings may be secondary to cholecystitis. Given the proximity to the pancreatic head, pancreatitis is not  entirely excluded. Recommend correlation with biochemical profile. For persistent concern for cholecystitis, consider  right upper quadrant ultrasound.  Assessment/Plan  MRCP,dilaudid as needed every 3 hours for pain control    --Dehydration secondary to nausea and vomiting; normal saline at 100 mL/hour  Zofran 4 mg IV every 4 hours as needed    --Alcoholic hepatitis; AST /ALT ratio is  more than 2 consistent with alcohol hepatitis. , I discussed with him to stop drinking in order his hepatitis to get better other wise it will get worse.His hepatitis is not sever which need to be treated with prednisone.  Admission Orders  CIWA, IVF 100/hr, dilaudid q2 prn, zofran IV prn

## 2014-08-08 NOTE — Progress Notes (Signed)
[facility]  PROGRESS NOTE      Patient: Gregory [name]  Date: 08/08/2014   LOS: 1 Days  Admission Date: 08/07/2014   MRN: [id number]  Attending: Rodman Pickle, MD       SUBJECTIVE     38 year old male with past medical history of hypothyroidism presented to the ER at [facility] with nausea vomiting.  Patient has long-standing history of alcoholism. A CAT scan was done at the ER which showed distended gallbladder with inflammatory changes. Patient was transferred to [facility] for MRCP. MRCP was done and it has does not show any CBD obstruction.  CT scan did show concern for cholecystitis as well as pancreatitis.   Ultrasound with no evidence of cholelithiasis or acute cholecystitis.   Patient says his nausea and vomiting are improved.   MEDICATIONS     Current Facility-Administered Medications   Medication Dose Route Frequency   . bacitracin zinc   Topical Daily   . dicyclomine  10 mg Oral QID   . enoxaparin  40 mg Subcutaneous Q24H   . pantoprazole  40 mg Intravenous Daily     . sodium chloride Stopped (08/08/14 0655)           PHYSICAL EXAM     Objective:  I/O last 3 completed shifts:  In: -   Out: 75 [Urine:75]     Filed Vitals:    08/08/14 0412 08/08/14 0500 08/08/14 0726 08/08/14 1107   BP: 129/90  135/94 130/89   Pulse: 145 72 90 100   Temp: 98.5 F (36.9 C)  97.8 F (36.6 C) 98.8 F (37.1 C)   TempSrc: Axillary  Axillary Oral   Resp: 18  20 20    Height:       Weight:       SpO2: 100%  100% 99%       Exam: Patient is awake, alert and oriented x 3, in no acute distress  HEENT: PERRLA, EOMI, Oral cavity well hydrated.  Neck: supple, without JVD  Chest: CTA bilaterally. No crackles or wheezing.  CVS: S1, S2 normal, no rubs, no murmurs, no gallops.  Regular rate and rhythm.  Abdomen: soft mild tenderness on deep palpation. No guarding no rigidity no rebound tenderness. Sounds are positive.  Musculoskeletal: No pitting edema, no cyanosis/clubbing  Skin: Warm dry no worrisome  lesions  NEURO:  Cranial nerve II-12 intact no motor or sensory deficits  Psychiatric: alert, interactive, appropriate, normal affect      LABS       Recent Labs  Lab 08/08/14  0518 08/07/14  0752   WBC 5.8 7.4   RBC 3.43* 3.80*   HEMOGLOBIN 12.1* 13.6   HEMATOCRIT 36.6* 40.5   MCV 107* 107*   PLATELETS 72* 82*       Recent Labs  Lab 08/08/14  0518 08/08/14  0123 08/07/14  2109 08/07/14  0752   SODIUM 136 137 139 140   POTASSIUM 3.3* 3.3* 3.6 3.5   CHLORIDE 103 104 102 97*   CO2 20.5 21.8 24.8 20.0   BUN 13 12 11 8    CREATININE 0.74* 0.73* 0.78* 0.90   GLUCOSE 72 85 96 118*   CALCIUM 8.5 8.5 9.0 9.6   MAGNESIUM  --   --  1.6  --          No results for input(s): OSMOLUR, NAUR in the last 168 hours.    Invalid input(s): OSMOL    Recent Labs  Lab 08/08/14  0518 08/08/14  0123 08/07/14  2109   ALT 64* 61* 68*   AST (SGOT) 239* 228* 249*   BILIRUBIN, TOTAL 2.5* 2.3* 2.5*   ALBUMIN 3.4* 3.2* 3.5   ALKALINE PHOSPHATASE 171* 170* 190*     No results for input(s): CK, CKMB, CKMBINDEX, TROPI in the last 168 hours.  No results found for: BNP   No results for input(s): INR, PT, PTT in the last 168 hours.     Invalid input(s): BLOODCULTURE      RADIOLOGY     Radiological Procedure reviewed.  MRI ABDOMEN WO CONTRAST MRCP   Final Result      1. Hepatic steatosis/hepatic parenchymal disease.   2. Normal caliber of the common bile duct without evidence of choledocholithiasis.   3. Gallbladder distention.   4. Persistent edema/inflammatory change about the gallbladder fossa and root of the mesentery of uncertain etiology.      Trans: Ljs         ReadingStation:[id number]          Assessment and Plan:     Abdominal pain with nausea and vomiting.  Patient with elevated liver enzymes, elevated total bilirubin of 3.5 which is now trending down.  Patient had a CAT scan of his abdomen done which showed distended gallbladder. Suspicious for cholecystitis. Also some inflammation around the pancreatic head. Ultrasound was done and no concern  for cholecystitis. MRCP was done and there was no evidence of CBD stone. Transaminases  and bilirubin is trending down. Continue with IV fluids. Lipase is within normal limits. We will order a HIDA scan to further evaluate for cholecystitis.  Consult to Gen. surgery.    Thrombocytopenia his platelets were 150 in 2010 102,014 and now it's in 70s and 80s range. Likely due to chronic alcoholism with possibly underlying liver disease and folic acid deficiency.    Alcoholic hepatitis. His AST was 301 100 respectively 2014 and remains elevated.  We will check hepatitis panel tomorrow morning    Hypokalemia Will replace    History of alcoholism  Continue with thiamine and folic acid infusion. Continue with IV fluids.  Patient is also on CIWA protocol will continue with that.    History of thyroid disease. TSH was checked and it is elevated. Will check T3-T4 for confirmation.    dvt px: Lovenox     Rodman Pickle, MD  08/08/2014 1:20 PM    I can be reached at [phone]

## 2014-08-08 NOTE — Plan of Care (Signed)
Problem: Pain  Goal: Patient's pain/discomfort is manageable  Outcome: Progressing  Patients pain managed well with diluadid 0.4 mg; decrease pain from 8/10 to 2/10.

## 2014-08-08 NOTE — Progress Notes (Signed)
Attempted to visit pt on 3 attempts.  First two he was in nuclear medicine for HIDA, which turned out to show no visualization of gallbladder, consistent with cholecystitis. Last time, discovered he left AMA.      Per [facility] note, pt understood morbidity of leaving with possible cholecystitis and still chose to leave.    If pt returns, pls call.    Sharyn Lull. Pernell Dupre, MD  ACCESS [phone]

## 2014-08-08 NOTE — Progress Notes (Signed)
Pt resting in bed with intermittent c/o abd pain that increases when he is dry heaving. Pt rates his pain at a 7-8/10 during his dry heaving episodes. Pt states that occasionally he is able to get up some small emesis that is mainly frothy and slight green and he feels slightly better when he is able to spit up some stuff. PRN pain meds and antiemetics administered for comfort as ordered.  Pt able to take his PO meds with no issues. IVF initiated at rate ordered with no problems. Pt ambulating in room with no complications. UA gathered clean catch and sent to lab. Bandage to right medial foot wound/burn changed with bacitracin ointment and dry dressing. CIWA protocol initiated and explained to pt that staff would be monitoring for any type of ETOH withdrawal. Pt stated that he drinks a 6 pack of beer q day and has been since he was about 53-53 years old. He stated that he has never gone through withdrawals in the past, even at a treatment/rehab center he attended for a short period approx. 1 year ago. Call bell in reach. Pt encouraged to call for assistance. Will continue to monitor.

## 2014-08-08 NOTE — Progress Notes (Signed)
Received call from nuclear medicine stating that patient had become completely disoriented and confused an 1 hour of the scan. Patient pulled out IV and states he is leaving. Patient transported back to 5th floor at this time. Assessment complete, patient alert and oriented x4 at this time, stated he became very disoriented during the scan and is not staying any longer, MD notified, VS taken, patient instructed not to leave against medical advise and educated on risks involved, patient refuses to stay, AMA form signed. Patient dressed and walking towards elevators.

## 2014-08-08 NOTE — Progress Notes (Signed)
[facility]  [address]  [place] [address]    INITIAL ASSESSMENT  Case Management      Estimated D/C Date:  11/22      PCP/Last visit: none      Home assessment/PLOF:  Lives with his fiance', does not have a car, gets transportation from friends/family or walks      Financials and Insurance:  none      Advertising copywriter: none      DME's/Supplier: none      Plan of Care: being observed for alcoholic hepatitis, CIWA, IVF@100 , receiving IV Dilaudid every 2-3 hours for abdominal pain      CM Interventions: spoke with patient and he denies any needs      Transportation:  family      Barriers to discharge: none       D/C Plan and Needs:  home without needs expected.  Says he is able to pay for meds up to about $50 at discharge.        Chrystine Oiler. Roseanne Reno, MSN, RN  Case Manager  [facility]  Ext [phone]

## 2014-09-11 ENCOUNTER — Emergency Department
Admission: EM | Admit: 2014-09-11 | Discharge: 2014-09-11 | Disposition: A | Discharge status: Home / Self Care | Payer: Self-pay | Attending: Emergency Medicine | Admitting: Emergency Medicine

## 2014-09-11 ENCOUNTER — Emergency Department: Payer: Self-pay

## 2014-09-11 DIAGNOSIS — D229 Melanocytic nevi, unspecified: Secondary | ICD-10-CM | POA: Insufficient documentation

## 2014-09-11 DIAGNOSIS — X58XXXA Exposure to other specified factors, initial encounter: Secondary | ICD-10-CM | POA: Insufficient documentation

## 2014-09-11 DIAGNOSIS — D696 Thrombocytopenia, unspecified: Secondary | ICD-10-CM | POA: Insufficient documentation

## 2014-09-11 DIAGNOSIS — L819 Disorder of pigmentation, unspecified: Secondary | ICD-10-CM

## 2014-09-11 DIAGNOSIS — S01411A Laceration without foreign body of right cheek and temporomandibular area, initial encounter: Secondary | ICD-10-CM | POA: Insufficient documentation

## 2014-09-11 LAB — COMPREHENSIVE METABOLIC PANEL
ALT: 51 U/L (ref 0–55)
AST (SGOT): 291 U/L — ABNORMAL HIGH (ref 10–42)
Albumin/Globulin Ratio: 0.73 Ratio (ref 0.70–1.50)
Albumin: 3.6 gm/dL (ref 3.5–5.0)
Alkaline Phosphatase: 223 U/L — ABNORMAL HIGH (ref 40–145)
Anion Gap: 20.8 mMol/L — ABNORMAL HIGH (ref 7.0–18.0)
BUN / Creatinine Ratio: 7 Ratio — ABNORMAL LOW (ref 10.0–30.0)
BUN: 5 mg/dL — ABNORMAL LOW (ref 7–22)
Bilirubin, Total: 2.2 mg/dL — ABNORMAL HIGH (ref 0.1–1.2)
CO2: 20 mMol/L (ref 20.0–30.0)
Calcium: 9.1 mg/dL (ref 8.5–10.5)
Chloride: 101 mMol/L (ref 98–110)
Creatinine: 0.71 mg/dL (ref 0.60–1.30)
EGFR: >60 mL/min/{1.73_m2}
Globulin: 5 gm/dL — ABNORMAL HIGH (ref 2.0–4.0)
Glucose: 89 mg/dL (ref 70–99)
Osmolality Calc: 272 mOsm/kg — ABNORMAL LOW (ref 275–300)
Potassium: 3.8 mMol/L (ref 3.5–5.3)
Protein, Total: 8.6 gm/dL — ABNORMAL HIGH (ref 6.0–8.3)
Sodium: 138 mMol/L (ref 136–147)

## 2014-09-11 LAB — PT AND APTT
PT INR: 1.2 (ref 0.9–1.2)
PT: 13 s — ABNORMAL HIGH (ref 9.7–12.3)
aPTT: 32.2 s (ref 23.2–33.0)

## 2014-09-11 LAB — CBC AND DIFFERENTIAL
Basophils %: 0.5 % (ref 0.0–3.0)
Basophils Absolute: 0 10*3/uL (ref 0.0–0.3)
Eosinophils %: 0.2 % (ref 0.0–7.0)
Eosinophils Absolute: 0 10*3/uL (ref 0.0–0.8)
Hematocrit: 39.9 % (ref 39.0–52.5)
Hemoglobin: 13.5 gm/dL (ref 13.0–17.5)
Lymphocytes Absolute: 2.7 10*3/uL (ref 0.6–5.1)
Lymphocytes: 35.3 % (ref 15.0–46.0)
MCH: 36 pg — ABNORMAL HIGH (ref 28–35)
MCHC: 34 gm/dL (ref 33–37)
MCV: 107 fL — ABNORMAL HIGH (ref 80–100)
MPV: 9.7 fL (ref 8.0–12.0)
Monocytes Absolute: 1 10*3/uL (ref 0.1–1.7)
Monocytes: 12.9 % (ref 3.0–15.0)
Neutrophils %: 51.1 % (ref 42.0–78.0)
Neutrophils Absolute: 4 10*3/uL (ref 1.7–8.6)
PLT CT: 65 10*3/uL — ABNORMAL LOW (ref 130–440)
RBC: 3.73 10*6/uL — ABNORMAL LOW (ref 4.00–5.70)
RDW: 14.1 % (ref 12.0–15.0)
WBC: 7.8 10*3/uL (ref 4.0–11.0)

## 2014-09-11 NOTE — ED Provider Notes (Signed)
Physician/Midlevel provider first contact with patient: 09/11/14 0716         History     Chief Complaint   Patient presents with   . Abscess     This patient has had a bump on his right cheek for several weeks. Last night he squeezed it and it began to bleed. For the last 12 hours and uncontrollably. His girlfriend who is a nurse's been putting direct pressure on it.    Patient admittedly is a heavy drinker. He's never been told he had a coagulopathy.    No syncope lightheadedness or chest pain        Past Medical History   Diagnosis Date   . Thyroid disease        Past Surgical History   Procedure Laterality Date   . Appendectomy     . Knee surgery       Hardware placement R knee       Family History   Problem Relation Age of Onset   . Diabetes Mother    . Diabetes Father    . Diabetes Maternal Grandmother    . Diabetes Maternal Grandfather    . Diabetes Paternal Grandmother    . Diabetes Paternal Grandfather        Social  History   Substance Use Topics   . Smoking status: Current Every Day Smoker -- 0.50 packs/day   . Smokeless tobacco: Never Used   . Alcohol Use: 25.2 oz/week     42 Cans of beer per week      Comment: daily, 6 pack beer q day       .     Allergies   Allergen Reactions   . Penicillins Hives       Current/Home Medications    No medications on file        Review of Systems   Constitutional: Negative for fever.   Skin: Positive for wound.   Neurological: Negative for syncope and light-headedness.       Physical Exam    BP: 124/80 mmHg, Heart Rate: (!) 101, Temp: 99.3 F (37.4 C), Resp Rate: 20, SpO2: 100 %     Physical Exam   Constitutional: He appears well-developed and well-nourished. No distress.   HENT:   Head:       Cardiovascular: Normal rate and regular rhythm.    Pulmonary/Chest: Effort normal and breath sounds normal.   Nursing note and vitals reviewed.        MDM and ED Course     ED Medication Orders     None         Results     Procedure Component Value Units Date/Time    PT/APTT  [[id number]]  (Abnormal) Collected:  09/11/14 0737    Specimen Information:  Blood Updated:  09/11/14 0808     PT 13.0 (H) sec      PT INR 1.2      aPTT 32.2 sec     Comprehensive metabolic panel [[id number]]  (Abnormal) Collected:  09/11/14 0737    Specimen Information:  Plasma Updated:  09/11/14 0804     Sodium 138 mMol/L      Potassium 3.8 mMol/L      Chloride 101 mMol/L      CO2 20.0 mMol/L      CALCIUM 9.1 mg/dL      Glucose 89 mg/dL      Creatinine 8.65 mg/dL      BUN 5 (L) mg/dL  Protein, Total 8.6 (H) gm/dL      Albumin 3.6 gm/dL      Alkaline Phosphatase 223 (H) U/L      ALT 51 U/L      AST (SGOT) 291 (H) U/L      Bilirubin, Total 2.2 (H) mg/dL      Albumin/Globulin Ratio 0.73 Ratio      Anion Gap 20.8 (H) mMol/L      BUN/Creatinine Ratio 7.0 (L) Ratio      EGFR >60 mL/min/1.75m2      Osmolality Calc 272 (L) mOsm/kg      Globulin 5.0 (H) gm/dL     CBC and differential [[id number]]  (Abnormal) Collected:  09/11/14 0737    Specimen Information:  Blood / Blood Updated:  09/11/14 0759     WBC 7.8 K/cmm      RBC 3.73 (L) M/cmm      Hemoglobin 13.5 gm/dL      Hematocrit 40.9 %      MCV 107 (H) fL      MCH 36 (H) pg      MCHC 34 gm/dL      RDW 81.1 %      PLT CT 65 (L) K/cmm      MPV 9.7 fL      NEUTROPHIL % 51.1 %      Lymphocytes 35.3 %      Monocytes 12.9 %      Eosinophils % 0.2 %      Basophils % 0.5 %      Neutrophils Absolute 4.0 K/cmm      Lymphocytes Absolute 2.7 K/cmm      Monocytes Absolute 1.0 K/cmm      Eosinophils Absolute 0.0 K/cmm      BASO Absolute 0.0 K/cmm              MDM       Lac Repair  Performed by: Loetta Rough D  Authorized by: Loetta Rough D    Anesthesia (see MAR for exact dosages):     Anesthesia method:  Local infiltration    Local anesthetic:  Lidocaine 1% WITH epi  Laceration details:     Location:  Face    Face location:  R cheek    Wound length (cm): 0.4 papule.  Exploration:     Hemostasis obtained with: 2 simple sutures required for hemostasis.  Treatment:     Area  cleansed with:  Shur-Clens    Irrigation solution:  Sterile saline  Skin repair:     Repair method:  Sutures    Suture size:  5-0    Suture material:  Nylon    Suture technique:  Simple interrupted    Number of sutures:  2  Comments:      2 sutures were placed in an X fashion across the papule with resolution of bleeding. Of note and infiltration with lidocaine with epinephrine did not resolve the bleeding    0830 - recheck - no bleeding have discussed his low platelets. I am concerned that this was a vascular lesion that he squeezed. I've told him that he'll need to have it evaluated and biopsied to make sure it is not a skin cancer.  Clinical Impression & Disposition     Clinical Impression  Final diagnoses:   Bleeding pigmented skin lesion   Thrombocytopenia        ED Disposition     Discharge Carleene Mains Demuro discharge to home/self care.    Condition  at disposition: Stable             New Prescriptions    No medications on file                 Manning Charity, MD  09/11/14 [phone]

## 2014-09-11 NOTE — ED Notes (Signed)
No bleeding noted at this time. Will continue to monitor.

## 2014-09-11 NOTE — ED Notes (Signed)
MD at bedside. 

## 2014-09-11 NOTE — ED Notes (Signed)
Patient states he popped a lesionon to right face yesterday, bleeding continues. Patient admits to being a heavy drinker daily.

## 2014-09-17 ENCOUNTER — Emergency Department: Payer: Self-pay

## 2014-09-17 ENCOUNTER — Emergency Department
Admission: EM | Admit: 2014-09-17 | Discharge: 2014-09-17 | Disposition: A | Discharge status: Home / Self Care | Payer: Self-pay | Attending: Emergency Medicine | Admitting: Emergency Medicine

## 2014-09-17 DIAGNOSIS — Z4802 Encounter for removal of sutures: Secondary | ICD-10-CM | POA: Insufficient documentation

## 2014-09-17 MED ORDER — SULFAMETHOXAZOLE-TRIMETHOPRIM 800-160 MG PO TABS
ORAL_TABLET | ORAL | Status: AC
Start: 2014-09-17 — End: ?
  Filled 2014-09-17: qty 1

## 2014-09-17 MED ORDER — SULFAMETHOXAZOLE-TRIMETHOPRIM 800-160 MG PO TABS
1.0000 | ORAL_TABLET | Freq: Two times a day (BID) | ORAL | Status: DC
Start: 2014-09-17 — End: 2014-10-20

## 2014-09-17 MED ORDER — SULFAMETHOXAZOLE-TRIMETHOPRIM 800-160 MG PO TABS
1.0000 | ORAL_TABLET | Freq: Once | ORAL | Status: AC
Start: 2014-09-17 — End: 2014-09-17
  Administered 2014-09-17: 1 via ORAL

## 2014-09-17 NOTE — Discharge Instructions (Signed)
Suture Removal, Infected  You were seen today for suture removal. Sometimes a laceration does not heal as expected due to an infection in the wound. Even with proper wound care, up to 5% of sutured lacerations become infected.  These infections will need to be treated with antibiotics. The infection should start to improve during the first three days, but finish all the antibiotic medicine.  Home care  The following guidelines will help you care for your wound at home:  1. Keep the wound clean and dry. Change the dressing daily. If it becomes wet, stained with wound fluid, or dirty, change it sooner.   Remove the old bandage and wash the wound with soap and water.   Use a wet cotton swap to remove any crust or drainage.   Apply an antibiotic cream or ointment.   Reapply the bandage.  2. Take the antibiotics as directed until they are all gone.  Follow-up care  Follow up with your doctor or this facility as advised. It is important to keep your follow-up appointment to be certain that the wound is responding to the antibiotic medicine.  When to seek medical advice  Call your health care provider right awayif any of these occur:   More pain, redness, or swelling   Red streaks around the wound   Fluid or pus draining from the wound   Fever of 100.56F (38.0C), or higher,for more than two days   2000-2015 [employer], [place]. [address] [place], [place] [address]. All rights reserved. This information is not intended as a substitute for professional medical care. Always follow your healthcare professional's instructions.      THE WOUND DOES APPEAR RED AND IS TENDER, BUT THIS MAY ALL BE INFLAMMATORY , RATHER THAN INFECTION.  TO CALM ANY POTENTIAL INFECTION, TAKE SEPTRA TWICE DAILY.  YOU NEED TO MAKE APPT WITH DERMATOLOGY AS REFERRED EARLIER.  RETURN TO ER IF WORSE AS ABOVE.

## 2014-09-17 NOTE — ED Provider Notes (Signed)
Physician/Midlevel provider first contact with patient: 09/17/14 0803         History     Chief Complaint   Patient presents with   . Suture / Staple Removal     38 year old male here for suture removal. Patient was seen 6 days ago by Dr. Mellody Dance for a bleeding pigmented lesion on his right medial cheek. To control the bleeding and required #2 sutures placed crisscross over the lesion. He was given referral to dermatology for possible definitive biopsy. this appointment has not yet been made. He does complain of redness and tenderness to the area, but this has been constant since the day of repair. He denies any increasing swelling, redness tenderness or any drainage. No fever.    Past Medical History   Diagnosis Date   . Thyroid disease        Past Surgical History   Procedure Laterality Date   . Appendectomy     . Knee surgery       Hardware placement R knee       Family History   Problem Relation Age of Onset   . Diabetes Mother    . Diabetes Father    . Diabetes Maternal Grandmother    . Diabetes Maternal Grandfather    . Diabetes Paternal Grandmother    . Diabetes Paternal Grandfather        Social  History   Substance Use Topics   . Smoking status: Current Every Day Smoker -- 0.50 packs/day   . Smokeless tobacco: Never Used   . Alcohol Use: 25.2 oz/week     42 Cans of beer per week      Comment: daily, 6 pack beer q day       .     Allergies   Allergen Reactions   . Penicillins Hives       Current/Home Medications    No medications on file        Review of Systems   Constitutional: Negative for fever.   Skin: Positive for wound.   All other systems reviewed and are negative.      Physical Exam    BP: 124/87 mmHg, Heart Rate: (!) 106, Temp: 99 F (37.2 C), Resp Rate: 16, SpO2: 95 %, Weight: 81.647 kg     Physical Exam   Constitutional: He is oriented to person, place, and time. He appears well-developed and well-nourished.   HENT:   Head: Normocephalic and atraumatic.   2 small sutures placed in a crisscross  pattern, right medial infraorbital. With 1 cmsurrounding erythema, tenderness, and mild edema. No fluctuance, discharge or drainage.   Eyes: EOM are normal. Pupils are equal, round, and reactive to light.   Neck: Neck supple.   Pulmonary/Chest: Effort normal.   Musculoskeletal: Normal range of motion.   Neurological: He is alert and oriented to person, place, and time.   Skin: Skin is warm and dry.   Psychiatric: He has a normal mood and affect.   Nursing note and vitals reviewed.        MDM and ED Course     ED Medication Orders     Start     Status Ordering Provider    09/17/14 [phone]  sulfamethoxazole-trimethoprim (BACTRIM DS,SEPTRA DS) 800-160 MG per tablet 1 tablet   Once in ED     Route: Oral  Ordered Dose: 1 tablet     Last MAR action:  Given Jacque Byron KAY  MDM  Number of Diagnoses or Management Options  Visit for suture removal:   Diagnosis management comments: I removed the sutures, without any bleeding or drainage. There is remarkable focal tenderness and redness.  Although this hasn't increased in 6 days,nd may be an inflammatory response, I will cover with antibiotics. First dose Cipro given. Patient is urged again to make appointment for follow-up with dermatology for definitive diagnosis of this lesion. He understands to return to ER if develops increasing redness, swelling, tenderness or fever.         Procedures    Clinical Impression & Disposition     Clinical Impression  Final diagnoses:   Visit for suture removal        ED Disposition     Discharge Carleene Mains [place] discharge to home/self care.    Condition at disposition: Stable             New Prescriptions    SULFAMETHOXAZOLE-TRIMETHOPRIM (SEPTRA DS) 800-160 MG PER TABLET    Take 1 tablet by mouth 2 (two) times daily.                 Clyda Greener, MD  09/17/14 [phone]

## 2014-09-29 ENCOUNTER — Ambulatory Visit: Payer: Self-pay | Admitting: Family

## 2014-09-30 ENCOUNTER — Telehealth: Payer: Self-pay

## 2014-09-30 NOTE — Telephone Encounter (Signed)
On Jan 11.2016 Mr falconi was  scheduled for follow up care  in the Transition clinic to assist with establishing  PCP. Chart reviewed for insurance information to help determine  transition to appropriate provider. He [place] unfunded and [employer] financed approved his FIs for charity care .He has had 5 encounters in 2015 to include one admission and he left AMA on 08/08/14 . He was seen in the [facility] ER on 09/17/14 for suture removal. The patient was no show /no call for his appointment in the transition clinic. I was unable to contact the patient by telephone  . A letter will be sent to his last known mailing address.  Chronic Care Management Progress Note     09/30/2014  9:34 AM  Care Team Member: Lynita Lombard Yashika Mask, RN  Patient Active Problem List    Diagnosis Date Noted   . Generalized abdominal pain 08/07/2014   . Intractable vomiting with nausea 08/07/2014   . Hyperbilirubinemia 08/07/2014   . Alcohol abuse 08/07/2014   . Abdominal pain 08/07/2014   . Dehydration 08/07/2014

## 2014-10-20 ENCOUNTER — Emergency Department
Admission: EM | Admit: 2014-10-20 | Discharge: 2014-10-20 | Disposition: A | Discharge status: Home / Self Care | Payer: Self-pay | Attending: Emergency Medicine | Admitting: Emergency Medicine

## 2014-10-20 ENCOUNTER — Encounter: Payer: Self-pay | Admitting: Anesthesiology

## 2014-10-20 ENCOUNTER — Emergency Department: Payer: Self-pay

## 2014-10-20 ENCOUNTER — Encounter
Admission: EM | Disposition: A | Discharge status: Other Acute Inpatient Hospital | Payer: Self-pay | Source: Home / Self Care | Attending: Emergency Medicine

## 2014-10-20 ENCOUNTER — Emergency Department
Admission: EM | Admit: 2014-10-20 | Discharge: 2014-10-20 | Disposition: A | Discharge status: Other Acute Inpatient Hospital | Payer: Self-pay | Attending: Emergency Medicine | Admitting: Emergency Medicine

## 2014-10-20 DIAGNOSIS — K7010 Alcoholic hepatitis without ascites: Secondary | ICD-10-CM | POA: Insufficient documentation

## 2014-10-20 DIAGNOSIS — F1010 Alcohol abuse, uncomplicated: Secondary | ICD-10-CM | POA: Insufficient documentation

## 2014-10-20 DIAGNOSIS — R1032 Left lower quadrant pain: Secondary | ICD-10-CM | POA: Insufficient documentation

## 2014-10-20 DIAGNOSIS — K828 Other specified diseases of gallbladder: Secondary | ICD-10-CM

## 2014-10-20 DIAGNOSIS — K7011 Alcoholic hepatitis with ascites: Secondary | ICD-10-CM | POA: Insufficient documentation

## 2014-10-20 DIAGNOSIS — R1084 Generalized abdominal pain: Secondary | ICD-10-CM | POA: Diagnosis present

## 2014-10-20 DIAGNOSIS — K4030 Unilateral inguinal hernia, with obstruction, without gangrene, not specified as recurrent: Secondary | ICD-10-CM | POA: Insufficient documentation

## 2014-10-20 DIAGNOSIS — K4090 Unilateral inguinal hernia, without obstruction or gangrene, not specified as recurrent: Secondary | ICD-10-CM | POA: Diagnosis present

## 2014-10-20 DIAGNOSIS — R112 Nausea with vomiting, unspecified: Secondary | ICD-10-CM | POA: Diagnosis present

## 2014-10-20 DIAGNOSIS — K7031 Alcoholic cirrhosis of liver with ascites: Secondary | ICD-10-CM | POA: Insufficient documentation

## 2014-10-20 DIAGNOSIS — R188 Other ascites: Secondary | ICD-10-CM

## 2014-10-20 LAB — ECG 12-LEAD
P Wave Axis: 64 deg
P Wave Duration: 110 ms
P-R Interval: 146 ms
Patient Age: 38 years
Q-T Dispersion: 94 ms
Q-T Interval(Corrected): 487 ms
Q-T Interval: 370 ms
QRS Axis: 16 deg
QRS Duration: 102 ms
T Axis: 36 deg
Ventricular Rate: 104 /min

## 2014-10-20 LAB — PT AND APTT
PT INR: 1.3 — ABNORMAL HIGH (ref 0.9–1.2)
PT: 13.4 s — ABNORMAL HIGH (ref 9.7–12.3)
aPTT: 35.5 s — ABNORMAL HIGH (ref 23.2–33.0)

## 2014-10-20 LAB — COMPREHENSIVE METABOLIC PANEL
ALT: 75 U/L — ABNORMAL HIGH (ref 0–55)
AST (SGOT): 579 U/L — ABNORMAL HIGH (ref 10–42)
Albumin/Globulin Ratio: 0.63 Ratio — ABNORMAL LOW (ref 0.70–1.50)
Albumin: 3.1 gm/dL — ABNORMAL LOW (ref 3.5–5.0)
Alkaline Phosphatase: 232 U/L — ABNORMAL HIGH (ref 40–145)
Anion Gap: 16.3 mMol/L (ref 7.0–18.0)
BUN / Creatinine Ratio: 7.5 Ratio — ABNORMAL LOW (ref 10.0–30.0)
BUN: 5 mg/dL — ABNORMAL LOW (ref 7–22)
Bilirubin, Total: 2.5 mg/dL — ABNORMAL HIGH (ref 0.1–1.2)
CO2: 24 mMol/L (ref 20.0–30.0)
Calcium: 8.7 mg/dL (ref 8.5–10.5)
Chloride: 104 mMol/L (ref 98–110)
Creatinine: 0.67 mg/dL (ref 0.60–1.30)
EGFR: >60 mL/min/{1.73_m2}
Globulin: 5 gm/dL — ABNORMAL HIGH (ref 2.0–4.0)
Glucose: 94 mg/dL (ref 70–99)
Osmolality Calc: 276 mOsm/kg (ref 275–300)
Potassium: 4.3 mMol/L (ref 3.5–5.3)
Protein, Total: 8.1 gm/dL (ref 6.0–8.3)
Sodium: 140 mMol/L (ref 136–147)

## 2014-10-20 LAB — CBC AND DIFFERENTIAL
Basophils %: 1 % (ref 0.0–3.0)
Basophils Absolute: 0.1 10*3/uL (ref 0.0–0.3)
Eosinophils %: 1 % (ref 0.0–7.0)
Eosinophils Absolute: 0.1 10*3/uL (ref 0.0–0.8)
Hematocrit: 41.1 % (ref 39.0–52.5)
Hemoglobin: 13.7 gm/dL (ref 13.0–17.5)
Lymphocytes Absolute: 1.5 10*3/uL (ref 0.6–5.1)
Lymphocytes: 22 % (ref 15.0–46.0)
MCH: 36 pg — ABNORMAL HIGH (ref 28–35)
MCHC: 33 gm/dL (ref 33–37)
MCV: 108 fL — ABNORMAL HIGH (ref 80–100)
MPV: 9.8 fL (ref 8.0–12.0)
Monocytes Absolute: 1.1 10*3/uL (ref 0.1–1.7)
Monocytes: 15 % (ref 3.0–15.0)
Neutrophils %: 61 % (ref 42.0–78.0)
Neutrophils Absolute: 4.3 10*3/uL (ref 1.7–8.6)
PLT CT: 69 10*3/uL — ABNORMAL LOW (ref 130–440)
RBC: 3.82 10*6/uL — ABNORMAL LOW (ref 4.00–5.70)
RDW: 14.7 % (ref 12.0–15.0)
WBC: 7 10*3/uL (ref 4.0–11.0)

## 2014-10-20 LAB — LIPASE: Lipase: 30 U/L (ref 8–78)

## 2014-10-20 SURGERY — REPAIR, VENTRAL HERNIA
Anesthesia: General

## 2014-10-20 MED ORDER — HYDROMORPHONE HCL 1 MG/ML IJ SOLN
INTRAMUSCULAR | Status: AC
Start: 2014-10-20 — End: ?
  Filled 2014-10-20: qty 1

## 2014-10-20 MED ORDER — ONDANSETRON 8 MG PO TBDP
8.0000 mg | ORAL_TABLET | Freq: Three times a day (TID) | ORAL | Status: DC | PRN
Start: 2014-10-20 — End: 2015-02-17

## 2014-10-20 MED ORDER — ONDANSETRON HCL 4 MG/2ML IJ SOLN
4.0000 mg | Freq: Once | INTRAMUSCULAR | Status: AC
Start: 2014-10-20 — End: 2014-10-20
  Administered 2014-10-20: 4 mg via INTRAVENOUS

## 2014-10-20 MED ORDER — MORPHINE SULFATE (PF) 2 MG/ML IV SOLN
INTRAVENOUS | Status: AC
Start: 2014-10-20 — End: ?
  Filled 2014-10-20: qty 1

## 2014-10-20 MED ORDER — MIDAZOLAM HCL 2 MG/2ML IJ SOLN
INTRAMUSCULAR | Status: AC
Start: 2014-10-20 — End: ?
  Filled 2014-10-20: qty 6

## 2014-10-20 MED ORDER — IOHEXOL 300 MG/ML IJ SOLN
100.0000 mL | Freq: Once | INTRAMUSCULAR | Status: AC | PRN
Start: 2014-10-20 — End: 2014-10-20
  Administered 2014-10-20: 100 mL via INTRAVENOUS
  Filled 2014-10-20: qty 100

## 2014-10-20 MED ORDER — VH HYDROMORPHONE HCL PF 1 MG/ML CARPUJECT
1.0000 mg | Freq: Once | INTRAMUSCULAR | Status: AC
Start: 2014-10-20 — End: 2014-10-20
  Administered 2014-10-20: 1 mg via INTRAVENOUS

## 2014-10-20 MED ORDER — MORPHINE SULFATE 2 MG/ML IJ/IV SOLN (WRAP)
2.0000 mg | Freq: Once | Status: AC
Start: 2014-10-20 — End: 2014-10-20
  Administered 2014-10-20: 2 mg via INTRAVENOUS

## 2014-10-20 MED ORDER — MIDAZOLAM HCL 2 MG/2ML IJ SOLN
2.0000 mg | Freq: Once | INTRAMUSCULAR | Status: DC
Start: 2014-10-20 — End: 2014-10-20

## 2014-10-20 MED ORDER — ONDANSETRON HCL 4 MG/2ML IJ SOLN
INTRAMUSCULAR | Status: AC
Start: 2014-10-20 — End: ?
  Filled 2014-10-20: qty 2

## 2014-10-20 MED ORDER — LORAZEPAM 0.5 MG PO TABS
0.5000 mg | ORAL_TABLET | Freq: Four times a day (QID) | ORAL | Status: DC | PRN
Start: 2014-10-20 — End: 2015-02-17

## 2014-10-20 NOTE — ED Notes (Signed)
Patient complaining of abdominal pain that started Friday.

## 2014-10-20 NOTE — Progress Notes (Signed)
Patient:  Gregory [name], Gregory [name]   MRN:  [id number]     Date:  10/20/2014     Patient not accepted by Dr. Raoul Pitch in transfer after reviewing case - no immediate surgical indications.  Case discussed with Dr. Kerrie Pleasure and no other criteria for admission.    Patient admits to ongoing alcohol use but seems to understand it is affecting him significantly and he needs to quit if he plans to live a longer life.  He has ongoing diffuse abdominal pain with some increase in the LLQ with some bulging.  He was seen at [facility] and CT was performed showing fluid within a [facility] but no bowel.    Exam:  Chronically ill-appearing  Abdomen:   soft, mild generalized tenderness without rebound and without guarding, mildly distended, Reducible hernia at L groin which immediately reproduces with soft fluid after reduction/flattening of the fluid, nontender to percussion     CT abdomen/pelvis 10/19/14 reviewed - no bowel involvement    Active Hospital Problems    Diagnosis   . Alcoholic hepatitis without ascites   . Alcohol abuse   . Left lower quadrant pain   . Generalized abdominal pain   Attempts at repair would lead to recurrence and ascites leak and potential hepatic decompensation +/- DTs during hospitalization - elective repair once abstains from alcohol and some reparation of the destructive hepatic process    D/C home - Referral to Transition Clinic - Abstain from alcohol    Solon Palm, MD   [facility]      [phone]  or  [phone]   *friend/family at bedside

## 2014-10-20 NOTE — ED Provider Notes (Addendum)
Physician/Midlevel provider first contact with patient: 10/20/14 1452         History     Chief Complaint   Patient presents with   . Abdominal Pain     Patient is a 39 y.o. male presenting with abdominal pain. The history is provided by the patient. No language interpreter was used.   Abdominal Pain  Pain location:  Generalized (constant diffuse nonlocalizing abdominal pressure sensation for several days.)  Pain quality: pressure    Pain radiates to:  Does not radiate  Progression:  Worsening  Chronicity:  New  Context: alcohol use    Context: not medication withdrawal, not previous surgeries, not sick contacts, not suspicious food intake and not trauma    Relieved by:  Nothing  Worsened by:  Nothing tried  Associated symptoms: flatus    Associated symptoms: no anorexia, no belching, no chest pain, no chills, no constipation, no cough, no dysuria, no fatigue, no fever, no hematemesis, no hematochezia, no melena, no nausea, no shortness of breath and no vomiting    the patient has had diffuse abdominal pressure discomfort for several days. Starting this morning the patient has had swelling diffusely of the abdomen. He had a normal bowel movement last night. No nausea or vomiting. Symptoms gradually worsening since onset and no URI symptoms No cough. No urinary symptoms         Past Medical History   Diagnosis Date   . Thyroid disease        Past Surgical History   Procedure Laterality Date   . Appendectomy     . Knee surgery       Hardware placement R knee       Family History   Problem Relation Age of Onset   . Diabetes Mother    . Diabetes Father    . Diabetes Maternal Grandmother    . Diabetes Maternal Grandfather    . Diabetes Paternal Grandmother    . Diabetes Paternal Grandfather        Social  History   Substance Use Topics   . Smoking status: Current Every Day Smoker -- 0.50 packs/day   . Smokeless tobacco: Never Used   . Alcohol Use: 25.2 oz/week     42 Cans of beer per week      Comment: daily, 6 pack  beer q day       .     Allergies   Allergen Reactions   . Penicillins Hives       Current/Home Medications    SULFAMETHOXAZOLE-TRIMETHOPRIM (SEPTRA DS) 800-160 MG PER TABLET    Take 1 tablet by mouth 2 (two) times daily.        Review of Systems   Constitutional: Negative for fever, chills and fatigue.   Respiratory: Negative for cough and shortness of breath.    Cardiovascular: Negative for chest pain.   Gastrointestinal: Positive for abdominal pain and flatus. Negative for nausea, vomiting, constipation, melena, hematochezia, anorexia and hematemesis.   Genitourinary: Negative for dysuria.   All other systems reviewed and are negative.      Physical Exam    BP: 147/90 mmHg, Heart Rate: (!) 117, Temp: 98.8 F (37.1 C), Resp Rate: 20, SpO2: 100 %, Weight: 81.647 kg    Physical Exam   Constitutional: He is oriented to person, place, and time. He appears well-developed and well-nourished.   HENT:   Head: Normocephalic and atraumatic.   Eyes: EOM are normal. Pupils are equal, round, and reactive  to light.   Neck: Neck supple.   Cardiovascular: Normal rate and normal heart sounds.    Pulmonary/Chest: Effort normal and breath sounds normal.   Abdominal: Soft. Bowel sounds are normal. He exhibits distension. He exhibits no mass. There is tenderness. A hernia is present.   Diffuse nonlocalizing tenderness. The abdomen is distended. Bowel sounds present. There is a large left inguinal hernia that is irreducible.   Neurological: He is oriented to person, place, and time. He displays normal reflexes. No cranial nerve deficit. He exhibits normal muscle tone. Coordination normal.   Skin: Skin is warm and dry.   Psychiatric: He has a normal mood and affect. His behavior is normal.   Nursing note and vitals reviewed.        MDM and ED Course     ED Medication Orders     Start     Status Ordering Provider    10/20/14 1523  ondansetron [facility]) injection 4 mg   Once in ED     Route: Intravenous  Ordered Dose: 4 mg     Last MAR  action:  Given Selinda Orion, [place]  S    10/20/14 1515  HYDROmorphone (DILAUDID) injection 1 mg   Once in ED     Route: Intravenous  Ordered Dose: 1 mg     Last MAR action:  Given Charissa Bash    10/20/14 1514     Once,   Status:  Discontinued     Route: Intravenous  Ordered Dose: 2-5 mg     Discontinued Charissa Bash         Results     Procedure Component Value Units Date/Time    PT/APTT [[id number]]  (Abnormal) Collected:  10/20/14 1510    Specimen Information:  Blood Updated:  10/20/14 1729     PT 13.4 (H) sec      PT INR 1.3 (H)      aPTT 35.5 (H) sec     CBC and differential [[id number]]  (Abnormal) Collected:  10/20/14 1510    Specimen Information:  Blood / Blood Updated:  10/20/14 1636     WBC 7.0 K/cmm      RBC 3.82 (L) M/cmm      Hemoglobin 13.7 gm/dL      Hematocrit 78.2 %      MCV 108 (H) fL      MCH 36 (H) pg      MCHC 33 gm/dL      RDW 95.6 %      PLT CT 69 (L) K/cmm      MPV 9.8 fL      NEUTROPHIL % 61.0 %      Lymphocytes 22.0 %      Monocytes 15.0 %      Eosinophils % 1.0 %      Basophils % 1.0 %      Neutrophils Absolute 4.3 K/cmm      Lymphocytes Absolute 1.5 K/cmm      Monocytes Absolute 1.1 K/cmm      Eosinophils Absolute 0.1 K/cmm      BASO Absolute 0.1 K/cmm      RBC Morphology RBC Morphology Reviewed      Anisocytosis 2+      Hypochromia 1+      Target Cells 1+     Narrative:      Manual differential performed    Comprehensive metabolic panel [[id number]]  (Abnormal) Collected:  10/20/14 1510    Specimen Information:  Plasma Updated:  10/20/14 1546     Sodium 140 mMol/L      Potassium 4.3 mMol/L      Chloride 104 mMol/L      CO2 24.0 mMol/L      CALCIUM 8.7 mg/dL      Glucose 94 mg/dL      Creatinine 1.61 mg/dL      BUN 5 (L) mg/dL      Protein, Total 8.1 gm/dL      Albumin 3.1 (L) gm/dL      Alkaline Phosphatase 232 (H) U/L      ALT 75 (H) U/L      AST (SGOT) 579 (H) U/L      Bilirubin, Total 2.5 (H) mg/dL      Albumin/Globulin Ratio 0.63 (L) Ratio      Anion Gap 16.3 mMol/L       BUN/Creatinine Ratio 7.5 (L) Ratio      EGFR >60 mL/min/1.20m2      Osmolality Calc 276 mOsm/kg      Globulin 5.0 (H) gm/dL         Radiology Results (24 Hour)     Procedure Component Value Units Date/Time    CT ABDOMEN PELVIS W IV/ WO PO CONT [[id number]] Collected:  10/20/14 1710    Order Status:  Completed Updated:  10/20/14 1725    Narrative:      INDICATION:  Abdominal distention and possible hernia. History of appendicitis surgery    EXAMINATION: CT Abdomen and Pelvis with contrast    COMPARISON: CT August 07, 2014    PROTOCOL:  Contiguous axial CT images were obtained from the chest base through the ischial tuberosities following  uneventful intravenous administration of 100 cc of Omnipaque.  Subsequent delayed images were obtained of the kidneys.  Sagittal and coronal reformatted images were also provided. Automatic exposure control (AEC) was utilized.    EXAM QUALITY:  Diagnostic quality: Satisfactory.  Comments: None.    FINDINGS:    Chest Base: There are trace bilateral pleural effusions, left greater than right. 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    Liver:  Heterogeneous parenchymal enhancement which may represent fatty infiltration or early cirrhotic change.    Gallbladder: Distended gallbladder, mildly edematous.    Biliary Tract:  No ductal dilatation.    Pancreas:  Normal    Spleen:  Normal    Adrenals:  Normal    Kidneys and Collecting Systems:  Normal    Lymph  Nodes:  Normal    Vessels: Mild atherosclerotic calcifications. No aneurysm. No venous filling defects. There are right sided varices.    GI Tract/Mesentery:  Normal  Appendix: Status post appendectomy    Peritoneal Cavity:  Moderate ascites throughout the abdomen.   Abdominal Wall/Inguinal Canals: There is a small left inguinal hernia containing a small amount of fluid.    Reproductive Organs:  Normal    Bladder:  Normal    Soft Tissues/Musculoskeletal: No acute osseous abnormality.  Soft tissues are unremarkable.       Impression:      IMPRESSION:      1. Heterogeneous liver parenchyma which may represent fatty infiltration, hepatitis, and/or early cirrhotic change.  There are varices.  2. Distended gallbladder.   3. Moderate ascites within the abdomen with small bilateral pleural effusions.   4. Small left inguinal hernia containing fluid.    ReadingStation:[email or web address]    XR ABDOMEN 2 VIEW WITH CHEST 1 VIEW [[id number]] Collected:  10/20/14 1343    Order  Status:  Completed Updated:  10/20/14 1345    Narrative:      Clinical History:  Abdominal pain and swelling    Examination:  Frontal view of the chest with supine and erect views of the abdomen.    Comparison:  None available.    Findings:  The heart is normal size.  Pulmonary vascularity unremarkable.  Lungs clear.  No pleural effusions.    Bowel gas pattern is normal.  No obvious masses or worrisome calcifications.  No free gas or pneumatosis.    Bones and soft tissues unremarkable.      Impression:      Normal one view chest exam and 2 view abdominal study.    ReadingStation:[id number]             MDM     4:23 PM   Dr. Zollie Beckers in attendance. Consulted for incarcerated inguinal hernia  MDM  Number of Diagnoses or Management Options  Incarcerated left inguinal hernia: new, needed workup     Amount and/or Complexity of Data Reviewed  Clinical lab tests: ordered and reviewed  Tests in the radiology section of CPT: ordered and reviewed  Discussion of test results with the performing providers: no  Decide to obtain previous medical records or to obtain history from someone other than the patient: no  Obtain history from someone other than the patient: no  Review and summarize past medical records: no  Discuss the patient with other providers: yes  Independent visualization of images, tracings, or specimens: yes    Risk of Complications, Morbidity, and/or Mortality  Presenting problems: high  Diagnostic procedures: high  Management options: high    Critical Care  Total time providing  critical care: 30-74 minutes    Patient Progress  Patient progress: stable      Procedures    Clinical Impression & Disposition     Clinical Impression  Final diagnoses:   Incarcerated left inguinal hernia   Ascites due to alcoholic hepatitis        ED Disposition     Transfer to [facility]            New Prescriptions    No medications on file                   Charissa Bash, MD  10/20/14 1646    Charissa Bash, MD  10/20/14 1647    Charissa Bash, MD  10/20/14 1736

## 2014-10-20 NOTE — Consults (Signed)
CONSULTATION    Date Time: 10/20/2014 5:27 PM  Patient Name: Gregory [name], Gregory [name]  MRN#: [id number]  DOB: [birth year]  Requesting Physician: Charissa Bash, MD EDMD  Referred to: General Surgery    Reason for Consultation:   Abdominal distention and pain    Assessment:   Incarcerated left inguinal hernia.  Enlarged gallbladder  Ascites  Abdominal pain  Possible cirrhosis    I am not sure which of these problems is the most pressing or how they are interrelated. He is certainly tender in the left inguinal canal with no evidence of obstruction on the CT scan. The ascites could be new and related to liver disease or being ill. His LFT elevation could be related to his chronic liver disease with an acute insult or his gallbladder could be problematic. It is all concerning for cirrhosis with his thrombocytopenia, elevated transaminases, ascites and parenchymal abnormalities on prior imaging.    Plan:   Discussed with [facility] both surgery and the ED and they have been kind enough to accept him in transfer for further evaluation to determine if he needs surgery or medical stabilization.    History:   Gregory [name] is a 39 y.o. male who presents to the hospital on 10/20/2014 with abdominal discomfort and bloating starting this past Friday. He has had worsening abdominal pain and distention which brought him to the ED. He was noted to have swelling in the left inguinal region then he mentioned that that has been getting worse over the weekend. He has a known inguinal hernia based on exam and a CT scan this past Fall. The inguinal hernia is not reducible and is tender. It is not firm and there is no erythema. I attempted after he received dilaudid to reduce the hernia manually with no success. The patient was having difficulty with cooperating due to the pain. As his plain films did not explain the amount of distention seen on exam, I proceeded with a CT scan of the abdomen and pelvis.    Past Medical History:     Past Medical  History   Diagnosis Date   . Thyroid disease        Past Surgical History:     Past Surgical History   Procedure Laterality Date   . Appendectomy     . Knee surgery       Hardware placement R knee       Family History:     Family History   Problem Relation Age of Onset   . Diabetes Mother    . Diabetes Father    . Diabetes Maternal Grandmother    . Diabetes Maternal Grandfather    . Diabetes Paternal Grandmother    . Diabetes Paternal Grandfather        Social History:     History     Social History   . Marital Status: Married     Spouse Name: N/A     Number of Children: N/A   . Years of Education: N/A     Social History Main Topics   . Smoking status: Current Every Day Smoker -- 0.50 packs/day   . Smokeless tobacco: Never Used   . Alcohol Use: 25.2 oz/week     42 Cans of beer per week      Comment: daily, 6 pack beer q day   . Drug Use: No   . Sexual Activity: Not on file     Other Topics Concern   . Not on  file     Social History Narrative       Allergies:     Allergies   Allergen Reactions   . Penicillins Hives       Medications:       Home Meds:  Prior to Admission medications    Medication Sig Start Date End Date Taking? Authorizing Provider   sulfamethoxazole-trimethoprim (SEPTRA DS) 800-160 MG per tablet Take 1 tablet by mouth 2 (two) times daily. 09/17/14   Clyda Greener, MD         Review of Systems:   Review of Systems   Constitutional: Negative for fever, chills, diaphoresis, appetite change and unexpected weight change.   HENT: Negative for trouble swallowing and voice change.    Respiratory: Negative for cough, choking, chest tightness, shortness of breath and wheezing.    Cardiovascular: Negative for chest pain and palpitations.        No heart murmur, atrial fibrillation, prior heart surgery   Gastrointestinal: see HPI  Genitourinary: Negative for dysuria, urgency, hematuria and difficulty urinating.   Skin: Negative for pallor, rash and wound.   Hematological: Negative for adenopathy. Does not  bruise/bleed easily.       No history of blood clots or known clotting disorder. No history of pulmonary embolism.         Physical Exam:     Filed Vitals:    10/20/14 1706   BP: 142/95   Pulse: 108   Temp:    Resp:    SpO2: 97%     81.647 kg (180 lb) 1.93 m (6\' 4" ) Body mass index is 21.92 kg/(m^2).     Intake and Output Summary (Last 24 hours) at Date Time  No intake or output data in the 24 hours ending 10/20/14 1727    General: awake, alert, oriented x 3; in moderate distress.  HEENT: eomi, sclera anicteric, mucous membranes moist, conjunctiva pink  Neck: supple, no lymphadenopathy, no thyromegaly, no JVD, no carotid bruits  Cardiovascular: regular rate and rhythm, no murmurs, rubs or gallops  Lungs: clear to auscultation bilaterally, without wheezing, rhonchi, or rales  Abdomen:abdomen is distended with normal bowel sounds, moderate tenderness in the in the LLQ.   Extremities: no clubbing, cyanosis, or edema  Skin: no rashes or lesions noted          Labs Reviewed:     Results     Procedure Component Value Units Date/Time    PT/APTT [[id number]] Collected:  10/20/14 1510    Specimen Information:  Blood Updated:  10/20/14 1721    CBC and differential [[id number]]  (Abnormal) Collected:  10/20/14 1510    Specimen Information:  Blood / Blood Updated:  10/20/14 1636     WBC 7.0 K/cmm      RBC 3.82 (L) M/cmm      Hemoglobin 13.7 gm/dL      Hematocrit 30.8 %      MCV 108 (H) fL      MCH 36 (H) pg      MCHC 33 gm/dL      RDW 65.7 %      PLT CT 69 (L) K/cmm      MPV 9.8 fL      NEUTROPHIL % 61.0 %      Lymphocytes 22.0 %      Monocytes 15.0 %      Eosinophils % 1.0 %      Basophils % 1.0 %      Neutrophils Absolute 4.3 K/cmm  Lymphocytes Absolute 1.5 K/cmm      Monocytes Absolute 1.1 K/cmm      Eosinophils Absolute 0.1 K/cmm      BASO Absolute 0.1 K/cmm      RBC Morphology RBC Morphology Reviewed      Anisocytosis 2+      Hypochromia 1+      Target Cells 1+     Narrative:      Manual differential performed     Comprehensive metabolic panel [[id number]]  (Abnormal) Collected:  10/20/14 1510    Specimen Information:  Plasma Updated:  10/20/14 1546     Sodium 140 mMol/L      Potassium 4.3 mMol/L      Chloride 104 mMol/L      CO2 24.0 mMol/L      CALCIUM 8.7 mg/dL      Glucose 94 mg/dL      Creatinine 1.91 mg/dL      BUN 5 (L) mg/dL      Protein, Total 8.1 gm/dL      Albumin 3.1 (L) gm/dL      Alkaline Phosphatase 232 (H) U/L      ALT 75 (H) U/L      AST (SGOT) 579 (H) U/L      Bilirubin, Total 2.5 (H) mg/dL      Albumin/Globulin Ratio 0.63 (L) Ratio      Anion Gap 16.3 mMol/L      BUN/Creatinine Ratio 7.5 (L) Ratio      EGFR >60 mL/min/1.32m2      Osmolality Calc 276 mOsm/kg      Globulin 5.0 (H) gm/dL             Rads:   Radiological Procedure reviewed independently by me. Report also reviewed.   Ct Abdomen Pelvis W Iv/ Wo Po Cont    10/20/2014   IMPRESSION:    1. Heterogeneous liver parenchyma which may represent fatty infiltration, hepatitis, and/or early cirrhotic change. There are varices. 2. Distended gallbladder.  3. Moderate ascites within the abdomen with small bilateral pleural effusions.  4. Small left inguinal hernia containing fluid.  ReadingStation:[email or web address]    Xr Abdomen 2 View With Chest 1 View    10/20/2014   Normal one view chest exam and 2 view abdominal study.  ReadingStation:[id number]      Signed by: Christoper Fabian, MD

## 2014-10-20 NOTE — ED Notes (Addendum)
Pt arrives EMS tranfer from [place] for incarcerated hernia. Pt c/o left upper and lower quadrant abdominal pain that radiates to the left lower groin and across abdomen. Reports diffuse tenderness to palpation to left groin and right lower abdominal pain. Pt abdomen is distended.    Pt denies SOB, chest pain. Reports HA, fever, nausea, vomiting, bloating.  Pt denies having consumed any alcohol today.

## 2014-10-20 NOTE — ED Notes (Signed)
Dr. Boyd at bedside.

## 2014-10-20 NOTE — ED Notes (Signed)
Dr. Hendren at bedside.

## 2014-10-20 NOTE — ED Provider Notes (Signed)
CHIEF COMPLAINT: Stomach is hurting.    PRE-HOSPITAL CARE: The patient is brought in transfer from  [facility].  The patient was evaluated for  abdominal pain at [facility] and was found to have  a left inguinal hernia with significant abdominal pain.  The  patient was evaluated by their surgeon and tried to reduce the  hernia, however, there is too much pain.  They then did a CT  scan of the abdomen and pelvis, which showed no evidence of  incarcerated hernia and a slightly thickened gallbladder.  The  patient was transferred here, however, it is unclear exactly why  the patient was transferred for a surgical evaluation.  The  patient complains of abdominal pain, which started last night.  He has had some nausea and vomiting without hematemesis or  melena.  He does complain of abdominal distention and bloating.  He denies any fever or chills.  He denies any testicular pain.  He complains of pain all over his abdomen including the left  lower quadrant.    REVIEW OF SYSTEMS: Positive nausea.  Positive vomiting.  Positive headache.  No fever, chills, cough, or congestion.  No  chest pain.  No shortness of breath.  Positive abdominal pain.  No urinary frequency.  No diarrhea.  No skin rash.  All other  systems negative except as noted per HPI.    PAST MEDICAL HISTORY: Significant for thyroid disease as well as  previous appendectomy.    CURRENT MEDICATIONS: None.    ALLERGIES: PENICILLIN.    FAMILY HISTORY: Not pertinent.    SOCIAL HISTORY: The patient does smoke.  He does drink regular  alcohol.  He denies drug use.    PHYSICAL EXAMINATION: VITAL SIGNS:  Temperature is 98.8, pulse  106, respiratory rate 17, BP 120/90, pulse ox 98% on room air.  Nurse's notes are reviewed by myself.  GENERAL:  This is an awake, alert, healthy-appearing 39 year old  male, lying on the stretcher.  He is nontoxic.  HEENT:  Head and scalp show no contusions, lacerations, or  abrasions.  Conjunctivae not pale.   Nares, no discharge.  Oropharynx is moist without lesions.  NECK:  Supple.  No lymphadenopathy.  CARDIOVASCULAR:  Slightly tachycardic without murmurs, gallops,  or rubs.  LUNGS:  Clear to auscultation in all fields without rales,  rhonchi, or wheezing.  ABDOMEN:  Soft.  He has diffuse tenderness to palpation in all  quadrants.  There is no focal tenderness.  In the inguinal area,  he does have inguinal hernia in the left groin region.  It is  not firm.  It is easily reducible.  It is diffusely tender to  palpation.  There is no erythema or warmth.  EXTREMITIES:  Warm and well perfused.  SKIN:  Warm and dry without rashes.  NEUROLOGIC:  He is awake, alert, appropriate without focal  deficits.    IMPRESSION: A 39 year old male with abdominal pain and ascites,  which is related to his chronic alcohol use.  In reviewing the  CT scan results, it does not appear that there is any acute  surgical emergency.  I will have surgery evaluate the patient.  If they do not believe that there is any acute surgery  necessary, I believe the patient can be discharged home with  outpatient follow up.    ANCILLARY SERVICES: Ancillary services performed.  Lipase was  added to the laboratory work done at [facility].  It is still pending at the  time of this dictation.  Results from  [facility] were reviewed, which showed a normal white  count.  He does have a low platelet count of 69.  Chem-7 is  unremarkable.  His liver profile shows generally elevated liver  enzymes with an AST of 579 and ALT of 75, alk phos of 232, and a  bilirubin of 2.5.  His INR is 1.3.    EMERGENCY DEPARTMENT COURSE AND TREATMENT: The patient remained  stable during the course in the emergency room.  He was  evaluated by Dr. Leavy Cella with surgery.  She read the CT scan and  evaluated the patient and saw no acute need for surgical  intervention.  The patient does have reducible inguinal hernia,  which can be followed as an outpatient.  Dr. Leavy Cella as  well as  myself strongly encouraged the patient to stop drinking before  his liver damage became permanent.  The patient will be written  for Ativan to help with withdrawal symptoms as well as Zofran  for nausea.  He will be given a referral to the transition  clinic in [facility].    PROCEDURES: None.    DISPOSITION: The patient discharged home in stable condition.    INSTRUCTIONS: Follow up with the transition clinic, return for  increasing abdominal pain, unreducible hernia, fever or  persistent vomiting.  He is to refrain from drinking.    FINAL DIAGNOSES:  1.  Abdominal pain.  2.  Alcohol abuse.  3.  Left inguinal hernia.  4.  Ascites.        [id number]  DD: 10/20/2014 60:45:40  DT: 10/20/2014 98:11:91  JOB: [id number]

## 2014-10-20 NOTE — Discharge Instructions (Signed)
Recovering from Addiction  Recovery means making a new life for yourself. This includes finding new interests. It involves building new relationships. It means taking better care of yourself. These will all help you replace substance use with a new and healthier life. They will also help you avoid the things that could make you want to use again.    Make lifestyle changes  A big part of recovery is changing habits. These are habits that may have led to your substance abuse. It's also a time for personal growth. Below are some changes you may want to make.   Find new activities and goals. You may want to try new hobbies and interests. Or, you may want to join an activity group to meet new people.   Build relationships. You may choose to spend more time with loved ones you lost touch with while you were using. You may also want to make new friends. And there may be some friends or family members you will not want to see because they are still using.   Exercise and eat well. Get some physical activity on most days. This can help you feel better. Eat healthy meals with lots of fruits, vegetables, and whole grains. This can also help your well-being. A nutritionist or fitness expert can help you.   Relax and get enough sleep. Good sleep can help you feel better. So can less stress. Ask your counselor about relaxation exercises. These may include meditation. Also ask about stress management classes.   2000-2015 [employer] [address], [place] [address]. All rights reserved. This information is not intended as a substitute for professional medical care. Always follow your healthcare professional's instructions.

## 2014-10-20 NOTE — ED Provider Notes (Signed)
Chart dictated. # [id number]    Myna Bright, MD  10/20/14 2037

## 2014-10-20 NOTE — ED Notes (Signed)
Bed: S24-A  Expected date:   Expected time:   Means of arrival:   Comments:  ems

## 2015-02-17 ENCOUNTER — Emergency Department: Payer: Self-pay

## 2015-02-17 ENCOUNTER — Emergency Department
Admission: EM | Admit: 2015-02-17 | Discharge: 2015-02-17 | Disposition: A | Discharge status: Home / Self Care | Payer: Self-pay | Attending: Emergency Medicine | Admitting: Emergency Medicine

## 2015-02-17 DIAGNOSIS — W01198A Fall on same level from slipping, tripping and stumbling with subsequent striking against other object, initial encounter: Secondary | ICD-10-CM | POA: Insufficient documentation

## 2015-02-17 DIAGNOSIS — K7031 Alcoholic cirrhosis of liver with ascites: Secondary | ICD-10-CM | POA: Insufficient documentation

## 2015-02-17 DIAGNOSIS — D229 Melanocytic nevi, unspecified: Secondary | ICD-10-CM | POA: Insufficient documentation

## 2015-02-17 DIAGNOSIS — D696 Thrombocytopenia, unspecified: Secondary | ICD-10-CM | POA: Insufficient documentation

## 2015-02-17 DIAGNOSIS — S300XXA Contusion of lower back and pelvis, initial encounter: Secondary | ICD-10-CM | POA: Insufficient documentation

## 2015-02-17 DIAGNOSIS — L819 Disorder of pigmentation, unspecified: Secondary | ICD-10-CM

## 2015-02-17 DIAGNOSIS — D649 Anemia, unspecified: Secondary | ICD-10-CM | POA: Insufficient documentation

## 2015-02-17 DIAGNOSIS — S0083XA Contusion of other part of head, initial encounter: Secondary | ICD-10-CM | POA: Insufficient documentation

## 2015-02-17 LAB — HEPATIC FUNCTION PANEL
ALT: 37 U/L (ref 0–55)
AST (SGOT): 304 U/L — ABNORMAL HIGH (ref 10–42)
Albumin/Globulin Ratio: 0.42 Ratio — ABNORMAL LOW (ref 0.70–1.50)
Albumin: 2.4 gm/dL — ABNORMAL LOW (ref 3.5–5.0)
Alkaline Phosphatase: 150 U/L — ABNORMAL HIGH (ref 40–145)
Bilirubin Direct: 3.8 mg/dL — ABNORMAL HIGH (ref 0.0–0.3)
Bilirubin, Total: 5.8 mg/dL — ABNORMAL HIGH (ref 0.1–1.2)
Globulin: 5.6 gm/dL — ABNORMAL HIGH (ref 2.0–4.0)
Protein, Total: 8 gm/dL (ref 6.0–8.3)

## 2015-02-17 LAB — CBC AND DIFFERENTIAL
Basophils Absolute: 0 10*3/uL (ref 0.0–0.3)
Eosinophils %: 2 % (ref 0.0–7.0)
Eosinophils Absolute: 0.1 10*3/uL (ref 0.0–0.8)
Hematocrit: 28.5 % — ABNORMAL LOW (ref 39.0–52.5)
Hemoglobin: 9.7 gm/dL — ABNORMAL LOW (ref 13.0–17.5)
Lymphocytes Absolute: 1.8 10*3/uL (ref 0.6–5.1)
Lymphocytes: 28 % (ref 15.0–46.0)
MCH: 36 pg — ABNORMAL HIGH (ref 28–35)
MCHC: 34 gm/dL (ref 33–37)
MCV: 104 fL — ABNORMAL HIGH (ref 80–100)
MPV: 10 fL (ref 8.0–12.0)
Monocytes Absolute: 0.5 10*3/uL (ref 0.1–1.7)
Monocytes: 8 % (ref 3.0–15.0)
Neutrophils %: 62 % (ref 42.0–78.0)
Neutrophils Absolute: 4 10*3/uL (ref 1.7–8.6)
PLT CT: 41 10*3/uL — ABNORMAL LOW (ref 130–440)
RBC: 2.73 10*6/uL — ABNORMAL LOW (ref 4.00–5.70)
RDW: 18.6 % — ABNORMAL HIGH (ref 12.0–15.0)
WBC: 6.4 10*3/uL (ref 4.0–11.0)

## 2015-02-17 LAB — PT AND APTT
PT INR: 1.8 — ABNORMAL HIGH (ref 0.9–1.2)
PT: 19.7 s — ABNORMAL HIGH (ref 9.7–12.3)
aPTT: 56.4 s — ABNORMAL HIGH (ref 23.2–33.0)

## 2015-02-17 MED ORDER — GELATIN ABSORBABLE 4 EX MISC
CUTANEOUS | Status: AC
Start: 2015-02-17 — End: ?
  Filled 2015-02-17: qty 1

## 2015-02-17 MED ORDER — IBUPROFEN 600 MG PO TABS
ORAL_TABLET | ORAL | Status: AC
Start: 2015-02-17 — End: ?
  Filled 2015-02-17: qty 1

## 2015-02-17 MED ORDER — IBUPROFEN 600 MG PO TABS
600.0000 mg | ORAL_TABLET | Freq: Once | ORAL | Status: AC
Start: 2015-02-17 — End: 2015-02-17
  Administered 2015-02-17: 600 mg via ORAL

## 2015-02-17 NOTE — Discharge Instructions (Signed)
Anemia, Type Not Specified (Adult)  Red blood cells carry oxygen to the tissues of your body. Anemia is a condition in which you have too few red blood cells. You need iron to make red blood cells. The most common cause of anemia is not having enough iron. This may be because of:   Loss of blood. This can be caused by heavy menstrual periods. It can also be caused by bleeding from the stomach or intestines.   Poor diet. You may not beeating enough foods that contain iron  Other causes of anemia include certain vitamin deficiencies, chronic kidney disease, and certain other chronic illnesses.  Anemia makes you to feel tired and run down. When anemia becomes severe, your skin becomes pale. You may feel short of breath after physical activity. Other symptoms include:   Headaches   Dizziness   Leg cramps with physical activity   Drowsiness  Home care  Follow these guidelines when caring for yourself at home:   Don't overexert yourself.   Talk with your health care provider before traveling by air or traveling to high altitudes.  Follow-up care  Follow up with your health care provider, or as advised. You may need other blood tests to find out the exact cause of your anemia. If you had testing done today, it may take several days to get all of the results. You can follow up with your own health care provider to get the results.  When to seek medical advice  Call your health care provider right awayif any of these occur:   Shortness of breath or chest pain   Dizziness or fainting gets worse   Vomiting blood or passing red or black-colored stool   2000-2015 [employer] [address] [place], [place] [address]. All rights reserved. This information is not intended as a substitute for professional medical care. Always follow your healthcare professional's instructions.          Cirrhosis Of The Liver    The liver is located on the right side of your abdomen, just below the rib cage. The liver  performs many essential functions including:   Filters toxins from the blood   Makes bile to aid digestion and absorption of vitamins from the GI tract   Makes clotting factors that stop bleeding if you injure yourself  Viral infections and toxins can cause permanent injury to the liver, called CIRRHOSIS. The most common cause of cirrhosis in the [place] is Hepatitis C and alcohol abuse. Other causes include Hepatitis B, medication side effects and others.  The scarring that results from cirrhosis interferes with normal liver function and can cause many complications, including:   Leakage of fluid from the blood vessels. This can cause pooling of fluid in the abdomen (ascites) or in the legs (edema).   Inability of blood to form clots normally. This can lead to excess bleeding.   Bleeding from blood vessels in the esophagus. This causes vomiting of blood or blood in the stool.   Build-up of bile in the blood which causes a yellow color of the eyes and skin (jaundice).   Build-up of toxins in the body which affect the brain and cause confusion.  Treatment is aimed at taking care of the symptoms and preventing further liver damage. If your liver damage is from alcohol, quitting will slow the progress of the disease and may prevent further complications. If cirrhosis progresses and becomes life-threatening, liver transplant may be considered. If your liver  damage is from Hepatitis B or C, treatments may be given to fight the virus.  Home Care:   Avoid medicines that can worsen liver damage such as acetaminophen (Tylenol). Your doctor will explain if any of the medicines you now take need to be changed. Talk to you doctor before taking mineral and vitamin supplements. Vitamin A, iron and copper can worsen liver damage.   Severely limit alcohol use, or stop altogether. If you are alcoholic, seek professional help. Consider joining Alcoholics Anonymous for ongoing support.   If you use IV drugs, you are at risk of  Hepatitis B and C. Seek help to stop. The viruses that cause this kind of hepatitis are passed by blood or sexual contact with an infected person. It may even be possible to pass Hepatitis C by sharing straws to snort cocaine. Never share needles or other equipment.  Follow Up  with your doctor or as advised by our staff. Learn more about your disease and the availability of support groups for others with the same condition. Contact one of the following for more information:   [employer] www.liverfoundation.org 312-401-0084   [employer] www.hepfi.org 800- I7250819  Get Prompt Medical Attention  if any of the following occur:   Rapid weight gain with increased size of your abdomen or leg swelling   Increasing jaundice (yellow color of skin or eyes)   Excess bleeding from cuts or injuries   2000-2015 [employer] [address], [place], [place] [address]. All rights reserved. This information is not intended as a substitute for professional medical care. Always follow your healthcare professional's instructions.            Discharge Instructions for Cirrhosis of the Liver  You have been diagnosed with cirrhosis of the liver. Cirrhosis is a chronic liver problem that occurs when liver tissue is destroyed and replaced by scar tissue. Causes of cirrhosis include infection such as viral hepatitis, chronic alcoholism, and genetic diseases.Signs of cirrhosis may be absent or only mild at first, but they usually get progressively worse. Cirrhosis is likely to occur if you have a history of alcohol abuse. Cirrhosis can't be cured,but it can be treated.More than 3 in 10 peoplepeople with cirrhosis are admitted back to the hospital in less than 4 weeks.  Home care    Don't drink alcohol. If you stop drinking now, you will feel better and live longer.   Find out about local support groups such as Alcoholics Anonymous in the phone book or online at [employer]ary[employer]tn if  you have been diagnosed with alcohol abuse or dependence. People with liver disease should not drink alcohol.   If alcohol is a problem, ask your doctor about medication that can help you quit drinking.   Cut back on salt.   Limit canned, dried, packaged, and fast foods.   Don't add salt to your food at the table.   Season foods with herbs instead of salt when you cook.   Take your medications exactly as directed.   Talk to your doctor about vitamin supplements as well as any over the counter medications.   Avoid aspirin and other blood-thinning medications.   Ask your doctor about what kind of diet you should follow. You may be asked to limit or not eat certain foods. Do not limit your protein intake.   Ask your doctor about receiving vaccinations for other viruses that can cause liver diseases.   Weigh yourself daily and keep a  weight log. If you have a sudden change in weight, call your health care provider.   Discuss vitamin supplements and deficiencies with your provider .  Follow-up care  Make a follow-up appointment as directed by our staff for lab tests, blood tests for liver cancer, and imaging of your liver (ultrasound every 6 months), and endoscopy to evaluate for varices.     [address] [employer] [address], [place], [place] [address]. All rights reserved. This information is not intended as a substitute for professional medical care. Always follow your healthcare professional's instructions.          Thrombocytopenia  Thrombocytopenia occurs when there are fewer platelets in the blood than normal. Platelets (also called thrombocytes) are blood cells that are needed for clotting. They help stop or control bleeding when you have a cut or wound. Thrombocytopenia can range from mild to severe. This depends on the number of platelets in your blood. If you have severe thrombocytopenia, you're at higher risk for bruising and bleeding. Your doctor can tell you more about your  condition and whether it needs to be treated.    Causes of thrombocytopenia  Platelets and other blood cells are made in the bone marrow. This is the soft, spongy part inside bones. Thrombocytopenia can result when:   The bone marrow doesn't make enough platelets.   Platelets are destroyed by the body at a rate faster than they can be made in the bone marrow.   Platelets become trapped in an enlarged spleen.  These problems can occur due to many reasons, including:   Certain conditions that affect how platelets are made in the bone marrow, such as aplastic anemia, leukemia, and lymphoma   Certain medications, such as some types of antibiotics, anti-seizure medications, and chemotherapy drugs   Certain viral infections, such as varicella (chicken pox), HIV, and Epstein-Barr virus   Certain autoimmune problems, such as lupus and immune thrombocytopenic purpura (ITP)   Certain conditions that can cause an enlarged spleen, such as cirrhosis and cancer   Alcohol abuse   Pregnancy  Symptoms of thrombocytopenia  Possible symptoms include:   Severe bruising or bleeding   Small red or purple spots (petechiae) on the skin   Bleeding gums   Nosebleeds   Bleeding from a wound that stops and starts again   Bloody urine or stool   Heavy menstrual flow (women only)  Diagnosing thrombocytopenia  Your doctor will ask about your symptoms and health history. You will also be examined. Tests will be done to confirm the problem as well. These may include:   A complete blood cell count (CBC). This test measures the amounts of the different types of cells in the blood. This includes the number of platelets in the blood (platelet count).   A blood smear. This test checks for the different types of blood cells in the blood and how they appear. A sample of your blood is spread on a glass slide and viewed under a microscope. A stain is used so the blood cells can be seen.   A bone marrow aspiration and biopsy. This test  checks for problems with how the bone marrow makes blood cells. A needle is used to remove a sample of the bone marrow in your hip bone. The sample is then sent to a lab to be tested for problems.  Treating thrombocytopenia  Often, no treatment is needed for thrombocytopenia. Your doctor will monitor your symptoms to see if they  improve. Blood tests will also be done to check whether your platelet count returns to normal on its own. If treatment is needed, this may involve:   Treatment of the underlying cause. For instance, if a medication is the cause, it may be stopped or changed.   Platelet transfusions. These help raise the number of healthy platelets in the body.   Blood transfusions. These help treat blood loss that may occur because of low platelets.   Medications. These may be given to help prevent platelets from being destroyed. These may also be given to help the bone marrow make more platelets.   Surgery to remove the spleen. The spleen helps filter the blood. It also stores some blood cells, including platelets. If the spleen is enlarged, it can store too many platelets. This causes there to be fewer platelets in the blood than normal. Though done less often, removing the spleen may help treat thrombocytopenia in certain cases.  Recovery and follow-up  Thrombocytopenia may be a short-term (acute) problem that has no lasting effects. Or it may be an ongoing (chronic) problem. If your condition is chronic, you may need specific treatments to manage it. You may also need to take certain steps daily to reduce your risk of bleeding. For instance, you may be told to limit certain activities that increase your risk of injury. You may also be told to avoid drinking alcohol and taking certain medications, such as aspirin and ibuprofen. These can worsen your symptoms. In addition, you will need to know and watch for signs and symptoms of bleeding. These are described in more detail in the box below.      [address] [employer] [address], [place], [place] [address]. All rights reserved. This information is not intended as a substitute for professional medical care. Always follow your healthcare professional's instructions.

## 2015-02-17 NOTE — ED Provider Notes (Signed)
Physician/Midlevel provider first contact with patient: 02/17/15 0907         History     Chief Complaint   Patient presents with   . Facial Injury    the patient was working last night, reports fell and struck him in the right side of his face, they knocked him down and then additional boards fell onto his left side of his low back and his left hip. He has complaints that for the board struck him beside his right nose, he had a small cyst on his skin and he continues degrees blood last night it stopped through the night and then he blew his nose today and it started bleeding again. This is a skin lesion outside of his nose, not epistaxis. He complains continued bruise on the left posterior hip and left anterior pelvis. No chest pain no abdominal pain, he was not struck on the skull no headache no loss of consciousness      Nursing (triage) note reviewed for the following pertinent information:    facial bleeding- got hit by 2x4 yesterday multiple complaints -back pain    Past Medical History   Diagnosis Date   . Thyroid disease        Past Surgical History   Procedure Laterality Date   . Appendectomy     . Knee surgery       Hardware placement R knee       Family History   Problem Relation Age of Onset   . Diabetes Mother    . Diabetes Father    . Diabetes Maternal Grandmother    . Diabetes Maternal Grandfather    . Diabetes Paternal Grandmother    . Diabetes Paternal Grandfather        Social  History   Substance Use Topics   . Smoking status: Current Every Day Smoker -- 0.50 packs/day     Types: Cigarettes   . Smokeless tobacco: Never Used   . Alcohol Use: 25.2 oz/week     42 Cans of beer per week      Comment: daily, 6 pack beer q day       .     Allergies   Allergen Reactions   . Penicillins Hives       Home Medications                                             Review of Systems   Respiratory: Negative for chest tightness and shortness of breath.    Cardiovascular: Negative for chest pain.    Gastrointestinal: Negative for abdominal pain.   Musculoskeletal:        Left posterior pelvis pain and anterior pelvic pain   Skin: Positive for wound.   Hematological: Bruises/bleeds easily.   Psychiatric/Behavioral: Negative for confusion.   All other systems reviewed and are negative.      Physical Exam    BP: 132/71 mmHg, Heart Rate: (!) 114, Temp: 98.4 F (36.9 C), Resp Rate: 16, SpO2: 100 %, Weight: 81.647 kg     Physical Exam   Constitutional: He is oriented to person, place, and time. He appears well-developed and well-nourished. He appears distressed.   HENT:   Head: Normocephalic.   Right Ear: External ear normal.   Left Ear: External ear normal.   Nose: Nose normal.   Mouth/Throat: Oropharynx is clear  and moist.   The patient has a small skin lesion on the right cheek, it is oozing blood slowly.   Eyes:   Jaundice evident   Cardiovascular: Normal rate and regular rhythm.    Pulmonary/Chest: Effort normal and breath sounds normal.   Abdominal: Soft. He exhibits distension. There is no tenderness.   Musculoskeletal: Normal range of motion.   There is contusion and ecchymosis in the posterior left pelvis. There is tenderness in the left high anterior thigh close to the pelvis as well there is no ecchymosis seen here   Neurological: He is alert and oriented to person, place, and time.   Skin: Skin is warm and dry.   Jaundice evident   Psychiatric: He has a normal mood and affect. His behavior is normal.   Nursing note and vitals reviewed.      Xr Lumbar Spine Ap And Lateral    02/17/2015   IMPRESSION: No acute osseous abnormality of the lumbar spine.   ReadingStation:[id number]    Xr Pelvis Portable    02/17/2015    No acute osseous abnormality of the pelvis.  ReadingStation:[id number]    MDM and ED Course     ED Medication Orders     Start Ordered     Status Ordering Provider    02/17/15 [phone] 02/17/15 0922  ibuprofen (ADVIL,MOTRIN) tablet 600 mg   Once in ED     Route: Oral  Ordered Dose: 600 mg     Last MAR  action:  Given Xaviar Lunn, PRESTON             MDM  Number of Diagnoses or Management Options  Alcoholic cirrhosis of liver with ascites:   Contusion of pelvis, initial encounter:   Facial contusion, initial encounter:   Diagnosis management comments: I cleaned and injected the area of using with 2% Xylocaine with epinephrine. I then obtained x-rays.    X-ray readings as above, no fracture seen. The bleeding area on the face has stopped now.    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  Injury is now a day old, he has no abdominal pain to indicate workup for intra-abdominal solid organ injury, he has no chest pain or shortness of breath and he has no headache or loss of consciousness at the time or since then. I don't feel that I need a work him up for more significant trauma given this presentation and timeframe       Amount and/or Complexity of Data Reviewed  Tests in the radiology section of CPT: reviewed              Procedures    Clinical Impression & Disposition     Clinical Impression  Final diagnoses:   Facial contusion, initial encounter   Contusion of pelvis, initial encounter   Alcoholic cirrhosis of liver with ascites        ED Disposition     Discharge Carleene Mains Ore discharge to home/self care.    Condition at disposition: Stable             New Prescriptions    No medications on file  Yevette Edwards, MD  02/17/15 1116

## 2015-02-17 NOTE — ED Notes (Signed)
Hit in back and face by falling 2x4.  Cyst on right side of face started to bleed.  Pain lower back, left upper arm and both knees.  No LOC.  Alert.

## 2015-02-17 NOTE — ED Notes (Signed)
Blood seeping through bandage.  Dr. Redmond School notified.  Pt holding pressure.

## 2015-02-17 NOTE — ED Provider Notes (Signed)
Physician/Midlevel provider first contact with patient: 02/17/15 1915         [facility] EMERGENCY DEPARTMENT HISTORY AND PHYSICAL EXAM      Patient Name: Gregory [name], Gregory [name]  Encounter Date:  02/17/2015  ED Provider: Gladis Riffle, M.D.  PCP: Christa See, MD  Patient DOB:  12-31-1975  MRN:  [id number]  Room:  5/ED5-A      History of Presenting Illness:   Chief complaint: Cyst    HPI/ROS is limited by: none  HPI/ROS given by: patient    Location: home  Duration: yesterday  Severity: mild    Gregory [name] is a 39 y.o. male who presents with bleeding from lesion cheek was injected today in ED chart reviewed. And went home sneezed around 2 hours ago and started bleeding again. 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 Not bleeding anywhere else.    Past Medical History:     Past Medical History   Diagnosis Date   . Thyroid disease    . Cirrhosis        Past Surgical History:     Past Surgical History   Procedure Laterality Date   . Appendectomy     . Knee surgery       Hardware placement R knee       Family History:     Family History   Problem Relation Age of Onset   . Diabetes Mother    . Diabetes Father    . Diabetes Maternal Grandmother    . Diabetes Maternal Grandfather    . Diabetes Paternal Grandmother    . Diabetes Paternal Grandfather        Social History:     History     Social History   . Marital Status: Married     Spouse Name: N/A   . Number of Children: N/A   . Years of Education: N/A     Social History Main Topics   . Smoking status: Current Every Day Smoker -- 0.50 packs/day     Types: Cigarettes   . Smokeless tobacco: Never Used   . Alcohol Use: 25.2 oz/week     42 Cans of beer per week      Comment: daily, 6 pack beer q day   . Drug Use: No   . Sexual Activity: Not on file     Other Topics Concern   . Not on file     Social History Narrative       Allergies:     Allergies   Allergen Reactions   . Penicillins Hives       Medications:     There are no discharge  medications for this patient.       Review of Systems:   Ears:  No ear pain.    Nose:  No congestion.  No discharge    Throat:  No sore throat.  No difficulty swallowing.    Cardiovascular: No chest pain.  No palpitations.    Respiratory: No cough.  No shortness of breath.    GU:  No dysuria.    Neurological:  No headache.  No weakness.    Musculoskeletal:  No pain.    Skin:  No rash.  Right facial skin lesions.    Endocrine:  No weight change    Psychiatric:  No depression.  No anxiety.      All other systems reviewed and negative except as above, pertinent findings in HPI.  Physical Exam:   Triage vitals  ED Triage Vitals   Enc Vitals Group      BP 02/17/15 1856 130/84 mmHg      Heart Rate 02/17/15 1856 108      Resp Rate 02/17/15 1856 20      Temp 02/17/15 1856 98.2 F (36.8 C)      Temp Source 02/17/15 1856 Oral      SpO2 02/17/15 1856 99 %      Weight 02/17/15 1856 81 kg      Height --       Head Cir --       Peak Flow --       Pain Score 02/17/15 1856 0      Pain Loc --       Pain Edu? --       Excl. in GC? --       most recent vitalsBP 110/78 mmHg  Pulse 108  Temp(Src) 98.2 F (36.8 C) (Oral)  Resp 20  Wt 81 kg  SpO2 97%    Constitutional:  Vitals signs reviewed, mild distress  Eyes:  Normal to inspection, no discharge  ENT:  Mouth normal to inspection  Neuro:  GCS 15, no focal motor deficits  Skin:  Warm, dry discolored skin papular area on the right cheek that is oozing blood.  Head:  Atraumatic, normocephalic  Resp/Chest:  No distress  Upper Extremity:  Inspection normal, no cyanosis  Lower Extremity:  Inspection normal, no cyanosis          Orders Placed:     Orders Placed This Encounter   Procedures   . CBC   . PT/APTT   . LFTs       Diagnostic Results:     The results of the diagnostic studies below have been reviewed by myself:    Labs  Results     Procedure Component Value Units Date/Time    CBC [[id number]]  (Abnormal) Collected:  02/17/15 1942    Specimen Information:  Blood from Blood  Updated:  02/17/15 2049     WBC 6.4 K/cmm      RBC 2.73 (L) M/cmm      Hemoglobin 9.7 (L) gm/dL      Hematocrit 62.1 (L) %      MCV 104 (H) fL      MCH 36 (H) pg      MCHC 34 gm/dL      RDW 30.8 (H) %      PLT CT 41 (L) K/cmm      MPV 10.0 fL      NEUTROPHIL % 62.0 %      Lymphocytes 28.0 %      Monocytes 8.0 %      Eosinophils % 2.0 %      Neutrophils Absolute 4.0 K/cmm      Lymphocytes Absolute 1.8 K/cmm      Monocytes Absolute 0.5 K/cmm      Eosinophils Absolute 0.1 K/cmm      BASO Absolute 0.0 K/cmm      RBC Morphology RBC Morphology Reviewed      Macrocytic 1+      Anisocytosis 1+      Hypochromia 1+      Dimorph RBC Pop Present     Narrative:      Manual differential performed    LFTs [[id number]]  (Abnormal) Collected:  02/17/15 1942    Specimen Information:  Plasma Updated:  02/17/15 2022  Protein, Total 8.0 gm/dL      Albumin 2.4 (L) gm/dL      Alkaline Phosphatase 150 (H) U/L      ALT 37 U/L      AST (SGOT) 304 (H) U/L      Bilirubin, Total 5.8 (H) mg/dL      Bilirubin, Direct 3.8 (H) mg/dL      Albumin/Globulin Ratio 0.42 (L) Ratio      Globulin 5.6 (H) gm/dL     PT/APTT [[id number]]  (Abnormal) Collected:  02/17/15 1942    Specimen Information:  Blood Updated:  02/17/15 2016     PT 19.7 (H) sec      PT INR 1.8 (H)      aPTT 56.4 (H) sec           Radiologic Studies  Radiology Results (24 Hour)     ** No results found for the last 24 hours. **          EKG: none    Procedure:   0740  0800 no active bleeding through dressing right now.  2219 no further bleeding with gelfoam and pressure  2223 discussed with Dr. Raynald Kemp and he will see at noon tomorrow to potentially excise this bleeding lesion on the patient's face.  2225 discussed with patient that the lesion is not bleeding with the Gelfoam and pressure and that he has an appointment with ENT tomorrow to determine if they're going to excise the lesion. Also discussed with patient that is blood count has decreased as well as his platelet count and he needs  to have an ongoing doctor he was given a number for the transition clinic and also the referral number so that he gets himself a doctor as soon as possible this was stressed to him emphatically and he said he would.   Assessment/Plan:   Follow-up with ENT tomorrow at 1145 for further evaluation  Get a family doctor or go to the transition clinic to get ongoing care for his cirrhosis and alcoholism.                Diagnosis / Disposition     Clinical Impression  1. Bleeding pigmented skin lesion    2. Ascites due to alcoholic cirrhosis    3. Thrombocytopenia    4. Anemia, unspecified type        Disposition  ED Disposition     Discharge Gregory [name] discharge to home/self care.    Condition at disposition: Stable            Prescriptions  There are no discharge medications for this patient.            Suella Broad, MD  02/18/15 [phone]

## 2015-02-17 NOTE — Discharge Instructions (Signed)
Bruises (Contusions)  A contusion is a bruise. A bruise happenswhen a blow to your body doesn't break the skin but does break blood vessels beneath the skin. Blood leaking from the broken vessels causes redness and swelling. As it heals, your bruise is likely to turn colors like purple, green, and yellow. This is normal. The bruise should fade in 2 or 3 weeks.    Factors that make you more likely to bruise  Almost everyone bruises now and then. Certain people do bruise more easily than others. You're more prone to bruising as you get older. That's because blood vessels become more fragile with age. You're also more likely to bruise if you have a clotting disorder such as hemophilia or take medications that reduce clotting, including aspirin.  When to go to the emergency room (ER)  Bruises almost always heal on their own without special treatment. But for some people, a bad bruise can be serious. Seek medical care if you:   Have a clotting disorder such as hemophilia.   Have cirrhosis or other serious liver disease.   Takeblood-thinning medications such as warfarin (Coumadin).    What to expect in the ER  A doctor will examine your bruise and ask about any health conditions you have. In some cases, you may have a test to check how well your blood clots. Other treatment will depend on your needs.  Follow-up care  Sometimes a bruise gets worse instead of better. It may become larger and more swollen. This can occur when your body walls off a small pool of blood under the skin (hematoma). In very rare cases, your doctor may need to drain excess blood from the area.   2000-2015 [employer] [address], [place] [address]. All rights reserved. This information is not intended as a substitute for professional medical care. Always follow your healthcare professional's instructions.

## 2015-02-17 NOTE — ED Notes (Signed)
Pt reports cyst on right side of nose started bleeding again after sneezing.

## 2015-02-17 NOTE — ED Notes (Signed)
Pt continues to ooze blood from dressing.  Dr. Redmond School notified.

## 2015-02-17 NOTE — ED Notes (Signed)
Lesion on right side of nose bleeds through dressing.  Gelfoam, gauze dressing applied by Dr. Redmond School.

## 2015-02-17 NOTE — ED Notes (Signed)
Gelfoam dressing applied to area of bleeding.

## 2015-02-17 NOTE — ED Notes (Signed)
Dried blood present on bandage.  No longer oozing fresh blood.

## 2015-02-18 ENCOUNTER — Encounter: Payer: Self-pay | Admitting: Emergency Medicine

## 2015-02-18 ENCOUNTER — Ambulatory Visit (INDEPENDENT_AMBULATORY_CARE_PROVIDER_SITE_OTHER): Payer: Self-pay | Admitting: Otolaryngology

## 2015-02-18 ENCOUNTER — Encounter (INDEPENDENT_AMBULATORY_CARE_PROVIDER_SITE_OTHER): Payer: Self-pay

## 2015-02-18 ENCOUNTER — Telehealth (INDEPENDENT_AMBULATORY_CARE_PROVIDER_SITE_OTHER): Payer: Self-pay

## 2015-02-18 NOTE — Telephone Encounter (Signed)
Pt called in around 11:04am and stated that we was in ED and needed a f/u appt. So i made appt for 1:00pm this afternoon.We called pt's numbers listed and unable to contact pt at 1:40pm to see why he isn't coming to appt.

## 2015-02-22 ENCOUNTER — Inpatient Hospital Stay: Payer: Charity | Admitting: Hospitalist

## 2015-02-22 ENCOUNTER — Inpatient Hospital Stay
Admission: EM | Admit: 2015-02-22 | Discharge: 2015-03-10 | DRG: 433 | Disposition: A | Discharge status: Home Health | Payer: Charity | Attending: Internal Medicine | Admitting: Internal Medicine

## 2015-02-22 ENCOUNTER — Emergency Department: Payer: Charity

## 2015-02-22 ENCOUNTER — Other Ambulatory Visit: Payer: Self-pay

## 2015-02-22 DIAGNOSIS — K7031 Alcoholic cirrhosis of liver with ascites: Secondary | ICD-10-CM | POA: Diagnosis present

## 2015-02-22 DIAGNOSIS — D689 Coagulation defect, unspecified: Secondary | ICD-10-CM | POA: Diagnosis present

## 2015-02-22 DIAGNOSIS — D62 Acute posthemorrhagic anemia: Secondary | ICD-10-CM | POA: Diagnosis present

## 2015-02-22 DIAGNOSIS — F419 Anxiety disorder, unspecified: Secondary | ICD-10-CM | POA: Diagnosis present

## 2015-02-22 DIAGNOSIS — S7002XA Contusion of left hip, initial encounter: Secondary | ICD-10-CM | POA: Diagnosis present

## 2015-02-22 DIAGNOSIS — E872 Acidosis, unspecified: Secondary | ICD-10-CM | POA: Diagnosis present

## 2015-02-22 DIAGNOSIS — N508 Other specified disorders of male genital organs: Secondary | ICD-10-CM | POA: Diagnosis not present

## 2015-02-22 DIAGNOSIS — D6959 Other secondary thrombocytopenia: Secondary | ICD-10-CM | POA: Diagnosis present

## 2015-02-22 DIAGNOSIS — F10239 Alcohol dependence with withdrawal, unspecified: Secondary | ICD-10-CM | POA: Diagnosis present

## 2015-02-22 DIAGNOSIS — R310 Gross hematuria: Secondary | ICD-10-CM | POA: Diagnosis not present

## 2015-02-22 DIAGNOSIS — K7040 Alcoholic hepatic failure without coma: Secondary | ICD-10-CM | POA: Diagnosis present

## 2015-02-22 DIAGNOSIS — R6510 Systemic inflammatory response syndrome (SIRS) of non-infectious origin without acute organ dysfunction: Secondary | ICD-10-CM

## 2015-02-22 DIAGNOSIS — S8012XA Contusion of left lower leg, initial encounter: Secondary | ICD-10-CM | POA: Diagnosis present

## 2015-02-22 DIAGNOSIS — J9811 Atelectasis: Secondary | ICD-10-CM | POA: Diagnosis present

## 2015-02-22 DIAGNOSIS — N179 Acute kidney failure, unspecified: Secondary | ICD-10-CM | POA: Diagnosis present

## 2015-02-22 DIAGNOSIS — W108XXA Fall (on) (from) other stairs and steps, initial encounter: Secondary | ICD-10-CM | POA: Diagnosis present

## 2015-02-22 DIAGNOSIS — K7290 Hepatic failure, unspecified without coma: Secondary | ICD-10-CM | POA: Diagnosis present

## 2015-02-22 DIAGNOSIS — K828 Other specified diseases of gallbladder: Secondary | ICD-10-CM | POA: Diagnosis present

## 2015-02-22 DIAGNOSIS — S7012XA Contusion of left thigh, initial encounter: Secondary | ICD-10-CM | POA: Diagnosis present

## 2015-02-22 DIAGNOSIS — R000 Tachycardia, unspecified: Secondary | ICD-10-CM

## 2015-02-22 DIAGNOSIS — D649 Anemia, unspecified: Secondary | ICD-10-CM

## 2015-02-22 DIAGNOSIS — F17200 Nicotine dependence, unspecified, uncomplicated: Secondary | ICD-10-CM | POA: Diagnosis present

## 2015-02-22 DIAGNOSIS — K766 Portal hypertension: Secondary | ICD-10-CM | POA: Diagnosis present

## 2015-02-22 DIAGNOSIS — R7989 Other specified abnormal findings of blood chemistry: Secondary | ICD-10-CM | POA: Diagnosis present

## 2015-02-22 DIAGNOSIS — R4182 Altered mental status, unspecified: Secondary | ICD-10-CM | POA: Diagnosis present

## 2015-02-22 DIAGNOSIS — Z880 Allergy status to penicillin: Secondary | ICD-10-CM

## 2015-02-22 DIAGNOSIS — K7011 Alcoholic hepatitis with ascites: Principal | ICD-10-CM | POA: Diagnosis present

## 2015-02-22 DIAGNOSIS — F10939 Alcohol use, unspecified with withdrawal, unspecified: Secondary | ICD-10-CM | POA: Diagnosis present

## 2015-02-22 LAB — CBC AND DIFFERENTIAL
Basophils Absolute Automated: 0.02 10*3/uL (ref 0.00–0.20)
Basophils Automated: 0 %
Eosinophils Absolute Automated: 0.01 10*3/uL (ref 0.00–0.70)
Eosinophils Automated: 0 %
Hematocrit: 18 % — ABNORMAL LOW (ref 42.0–52.0)
Hematocrit: 20.6 % — ABNORMAL LOW (ref 42.0–52.0)
Hgb: 6.5 g/dL — ABNORMAL LOW (ref 13.0–17.0)
Hgb: 7.4 g/dL — ABNORMAL LOW (ref 13.0–17.0)
Immature Granulocytes Absolute: 0.02 10*3/uL
Immature Granulocytes: 0 %
Lymphocytes Absolute Automated: 1.43 10*3/uL (ref 0.50–4.40)
Lymphocytes Automated: 15 %
MCH: 34.6 pg — ABNORMAL HIGH (ref 28.0–32.0)
MCH: 33.8 pg — ABNORMAL HIGH (ref 28.0–32.0)
MCHC: 36.1 g/dL — ABNORMAL HIGH (ref 32.0–36.0)
MCHC: 35.9 g/dL (ref 32.0–36.0)
MCV: 95.7 fL (ref 80.0–100.0)
MCV: 94.1 fL (ref 80.0–100.0)
MPV: 10.5 fL (ref 9.4–12.3)
MPV: 11.2 fL (ref 9.4–12.3)
Monocytes Absolute Automated: 1.66 10*3/uL — ABNORMAL HIGH (ref 0.00–1.20)
Monocytes: 18 %
Neutrophils Absolute: 6.21 10*3/uL (ref 1.80–8.10)
Neutrophils: 66 %
Nucleated RBC: 0 /100 WBC (ref 0–1)
Platelets: 64 10*3/uL — ABNORMAL LOW (ref 140–400)
Platelets: 58 10*3/uL — ABNORMAL LOW (ref 140–400)
RBC: 1.88 10*6/uL — ABNORMAL LOW (ref 4.70–6.00)
RBC: 2.19 10*6/uL — ABNORMAL LOW (ref 4.70–6.00)
RDW: 22 % — ABNORMAL HIGH (ref 12–15)
RDW: 21 % — ABNORMAL HIGH (ref 12–15)
WBC: 9.54 10*3/uL (ref 3.50–10.80)
WBC: 9.35 10*3/uL (ref 3.50–10.80)

## 2015-02-22 LAB — COMPREHENSIVE METABOLIC PANEL
ALT: 33 U/L (ref 0–55)
AST (SGOT): 248 U/L — ABNORMAL HIGH (ref 5–34)
Albumin/Globulin Ratio: 0.4 — ABNORMAL LOW (ref 0.9–2.2)
Albumin: 2.2 g/dL — ABNORMAL LOW (ref 3.5–5.0)
Alkaline Phosphatase: 142 U/L — ABNORMAL HIGH (ref 38–106)
BUN: 9 mg/dL (ref 9.0–28.0)
Bilirubin, Total: 7.7 mg/dL — ABNORMAL HIGH (ref 0.2–1.2)
CO2: 17 mEq/L — ABNORMAL LOW (ref 22–29)
Calcium: 7.8 mg/dL — ABNORMAL LOW (ref 8.5–10.5)
Chloride: 104 mEq/L (ref 100–111)
Creatinine: 1.4 mg/dL — ABNORMAL HIGH (ref 0.7–1.3)
Globulin: 5.2 g/dL — ABNORMAL HIGH (ref 2.0–3.6)
Glucose: 121 mg/dL — ABNORMAL HIGH (ref 70–100)
Potassium: 4.4 mEq/L (ref 3.5–5.1)
Protein, Total: 7.4 g/dL (ref 6.0–8.3)
Sodium: 135 mEq/L — ABNORMAL LOW (ref 136–145)

## 2015-02-22 LAB — CELL MORPHOLOGY
Cell Morphology: ABNORMAL — AB
Platelet Estimate: DECREASED — AB

## 2015-02-22 LAB — TSH: TSH: 6.7 u[IU]/mL — ABNORMAL HIGH (ref 0.35–4.94)

## 2015-02-22 LAB — MAN DIFF ONLY
Band Neutrophils Absolute: 0.1 10*3/uL (ref 0.00–1.00)
Band Neutrophils: 1 %
Basophils Absolute Manual: 0 10*3/uL (ref 0.00–0.20)
Basophils Manual: 0 %
Eosinophils Absolute Manual: 0 10*3/uL (ref 0.00–0.70)
Eosinophils Manual: 0 %
Lymphocytes Absolute Manual: 0.67 10*3/uL (ref 0.50–4.40)
Lymphocytes Manual: 7 %
Monocytes Absolute: 0.38 10*3/uL (ref 0.00–1.20)
Monocytes Manual: 4 %
Neutrophils Absolute Manual: 8.4 10*3/uL — ABNORMAL HIGH (ref 1.80–8.10)
Nucleated RBC: 0 /100 WBC (ref 0–1)
Segmented Neutrophils: 88 %

## 2015-02-22 LAB — PREPARE RBC
Expiration Date: 201606122359
Status: TRANSFUSED
UTYPE: O NEG

## 2015-02-22 LAB — PT/INR
PT INR: 2.3 — ABNORMAL HIGH (ref 0.9–1.1)
PT: 24.8 s — ABNORMAL HIGH (ref 12.6–15.0)

## 2015-02-22 LAB — APTT: PTT: 51 s — ABNORMAL HIGH (ref 23–37)

## 2015-02-22 LAB — GFR: EGFR: 56.1

## 2015-02-22 LAB — AMMONIA
Ammonia: 31 umol/L (ref 18–72)
Ammonia: 37 umol/L (ref 18–72)

## 2015-02-22 LAB — ACETAMINOPHEN LEVEL: Acetaminophen Level: <6 ug/mL — ABNORMAL LOW (ref 10–30)

## 2015-02-22 LAB — BILIRUBIN, DIRECT: Bilirubin Direct: 5.3 mg/dL — ABNORMAL HIGH (ref 0.0–0.5)

## 2015-02-22 LAB — TYPE AND SCREEN
AB Screen Gel: NEGATIVE
ABO Rh: O POS

## 2015-02-22 LAB — SALICYLATE LEVEL: Salicylate Level: <5 mg/dL — ABNORMAL LOW (ref 15.0–30.0)

## 2015-02-22 LAB — T3, FREE: T3, Free: 1.71 pg/mL (ref 1.71–3.71)

## 2015-02-22 LAB — LACTIC ACID, PLASMA: Lactic Acid: 4.7 mmol/L (ref 0.2–2.0)

## 2015-02-22 LAB — MAGNESIUM: Magnesium: 1.6 mg/dL (ref 1.6–2.6)

## 2015-02-22 LAB — ETHANOL: Alcohol: 70 mg/dL — ABNORMAL HIGH

## 2015-02-22 LAB — T4, FREE: T4 Free: 1.06 ng/dL (ref 0.70–1.48)

## 2015-02-22 LAB — LIPASE: Lipase: 66 U/L (ref 8–78)

## 2015-02-22 MED ORDER — ONDANSETRON HCL 4 MG/2ML IJ SOLN
4.0000 mg | Freq: Three times a day (TID) | INTRAMUSCULAR | Status: DC | PRN
Start: 2015-02-22 — End: 2015-03-03

## 2015-02-22 MED ORDER — LORAZEPAM 2 MG/ML IJ SOLN
1.0000 mg | Freq: Once | INTRAMUSCULAR | Status: AC
Start: 2015-02-22 — End: 2015-02-22
  Administered 2015-02-22: 1 mg via INTRAVENOUS

## 2015-02-22 MED ORDER — TAB-A-VITE/BETA CAROTENE PO TABS
1.0000 | ORAL_TABLET | Freq: Every day | ORAL | Status: DC
Start: 2015-02-23 — End: 2015-03-10
  Administered 2015-02-23 – 2015-03-10 (×16): 1 via ORAL
  Filled 2015-02-22 (×17): qty 1

## 2015-02-22 MED ORDER — FOLIC ACID 1 MG PO TABS
1.0000 mg | ORAL_TABLET | Freq: Every day | ORAL | Status: DC
Start: 2015-02-23 — End: 2015-03-10
  Administered 2015-02-23 – 2015-03-10 (×16): 1 mg via ORAL
  Filled 2015-02-22 (×16): qty 1

## 2015-02-22 MED ORDER — LORAZEPAM 2 MG/ML IJ SOLN
2.0000 mg | INTRAMUSCULAR | Status: DC | PRN
Start: 2015-02-22 — End: 2015-03-05
  Administered 2015-02-23: 4 mg via INTRAVENOUS
  Administered 2015-02-23 (×2): 2 mg via INTRAVENOUS
  Administered 2015-02-23 – 2015-02-25 (×7): 4 mg via INTRAVENOUS
  Administered 2015-02-25 – 2015-02-27 (×3): 2 mg via INTRAVENOUS
  Filled 2015-02-22: qty 1
  Filled 2015-02-22: qty 2
  Filled 2015-02-22 (×2): qty 1
  Filled 2015-02-22: qty 2
  Filled 2015-02-22: qty 1
  Filled 2015-02-22 (×2): qty 2
  Filled 2015-02-22: qty 1
  Filled 2015-02-22 (×3): qty 2
  Filled 2015-02-22 (×2): qty 1

## 2015-02-22 MED ORDER — LORAZEPAM 2 MG/ML IJ SOLN
1.0000 mg | Freq: Once | INTRAMUSCULAR | Status: AC
Start: 2015-02-22 — End: 2015-02-22
  Administered 2015-02-22: 1 mg via INTRAVENOUS
  Filled 2015-02-22: qty 1

## 2015-02-22 MED ORDER — LACTULOSE 10 GM/15ML PO SOLN
30.0000 g | Freq: Once | ORAL | Status: DC
Start: 2015-02-22 — End: 2015-02-23

## 2015-02-22 MED ORDER — ONDANSETRON 4 MG PO TBDP
4.0000 mg | ORAL_TABLET | Freq: Three times a day (TID) | ORAL | Status: DC | PRN
Start: 2015-02-22 — End: 2015-03-03
  Administered 2015-03-03: 4 mg via ORAL
  Filled 2015-02-22: qty 1

## 2015-02-22 MED ORDER — SODIUM CHLORIDE 0.9 % IV BOLUS
1000.0000 mL | Freq: Once | INTRAVENOUS | Status: AC
Start: 2015-02-22 — End: 2015-02-22
  Administered 2015-02-22: 1000 mL via INTRAVENOUS

## 2015-02-22 MED ORDER — THIAMINE HCL 100 MG PO TABS
100.0000 mg | ORAL_TABLET | Freq: Every day | ORAL | Status: DC
Start: 2015-02-23 — End: 2015-03-10
  Administered 2015-02-23 – 2015-03-10 (×16): 100 mg via ORAL
  Filled 2015-02-22 (×17): qty 1

## 2015-02-22 MED ORDER — PANTOPRAZOLE SODIUM 40 MG IV SOLR
40.0000 mg | Freq: Two times a day (BID) | INTRAVENOUS | Status: DC
Start: 2015-02-22 — End: 2015-03-10
  Administered 2015-02-22 – 2015-03-10 (×32): 40 mg via INTRAVENOUS
  Filled 2015-02-22 (×30): qty 40

## 2015-02-22 MED ORDER — ONDANSETRON HCL 4 MG/2ML IJ SOLN
INTRAMUSCULAR | Status: AC
Start: 2015-02-22 — End: 2015-02-22
  Filled 2015-02-22: qty 2

## 2015-02-22 MED ORDER — SODIUM CHLORIDE 0.9 % IV SOLN
INTRAVENOUS | Status: DC | PRN
Start: 2015-02-22 — End: 2015-02-24

## 2015-02-22 MED ORDER — THIAMINE HCL 100 MG/ML IJ SOLN
Freq: Every day | INTRAMUSCULAR | Status: AC
Start: 2015-02-22 — End: 2015-02-22
  Filled 2015-02-22: qty 1000

## 2015-02-22 MED ORDER — PROMETHAZINE HCL 25 MG/ML IJ SOLN
12.5000 mg | Freq: Once | INTRAMUSCULAR | Status: AC
Start: 2015-02-22 — End: 2015-02-22
  Administered 2015-02-22: 12.5 mg via INTRAVENOUS
  Filled 2015-02-22: qty 1

## 2015-02-22 MED ORDER — LACTATED RINGERS IV SOLN
INTRAVENOUS | Status: DC
Start: 2015-02-22 — End: 2015-02-23

## 2015-02-22 MED ORDER — SODIUM CHLORIDE 0.9 % IV SOLN
INTRAVENOUS | Status: DC
Start: 2015-02-23 — End: 2015-02-23

## 2015-02-22 MED ORDER — ONDANSETRON HCL 4 MG/2ML IJ SOLN
4.0000 mg | Freq: Once | INTRAMUSCULAR | Status: AC
Start: 2015-02-22 — End: 2015-02-22
  Administered 2015-02-22: 4 mg via INTRAVENOUS

## 2015-02-22 MED ORDER — NALOXONE HCL 0.4 MG/ML IJ SOLN
0.2000 mg | INTRAMUSCULAR | Status: DC | PRN
Start: 2015-02-22 — End: 2015-03-10

## 2015-02-22 MED ORDER — SODIUM CHLORIDE 0.9 % IV BOLUS
1000.0000 mL | Freq: Once | INTRAVENOUS | Status: AC
Start: 2015-02-22 — End: 2015-02-23
  Administered 2015-02-22: 1000 mL via INTRAVENOUS

## 2015-02-22 NOTE — H&P (Signed)
IM Attending Addendum:  I have seen and examined patient personally. I have discussed with housestaff, and reviewed and agree with findings and plan as per Dr. Lequita Halt with following caveats:    Impression:  -Obstructive jaundice/nausea with vomiting/abd pain--also prior hx of possible cholecystitis/pancreatitis/left inguinal hernia--no sig peritoneal signs, no sig ruq ttp/neg murphys  -Likely Decompensated Alcoholic cirrhosis with ascites  -Anemia-h/h 6.5/18.0--likely acute blood loss d/t L thigh hematoma after fall  -Thrombocytopenia  -SIRS (POA)/lactic acidosis-afebrile/no leukocytosis  -Acute etoh withdrawal-tremor (vs asterixis from liver failure/HE)  -Lethargy likely largely d/t ativan/dilaudid at OSH but eval HE/sepsis  -AKI  -Elevated TSH  -Recent fall with L hip pain--has L thigh hematoma  -LLL ASD and RLL atelectasis + L pleural fluid/thickening on CXR  -AG met acidosis  -Tobacco dependence  -PCN allergy    Plan:  -[facility]  -tele  -ensure adeq iv access  -npo for now (?GIB/n/v)  -T&C and transfuse 1u prbc --ordered in ER--serial h/h  -neurovascular checks LLE  -ppi iv bid for now  -ativan per ciwa scale  -gentle ivf while npo-banana bag   -c/s GI in am--poss egd, and for assistant  liver dz mgmt  -lactulose x 1  -check d bili/coags/ammonia/lactate; f/u cmp daily  -check full tfts  -procedure team for diag/therapeutic paracentesis in AM  -review ct and w/u done at osh; decide need for addl imaging   -if never had Left inguinal hernia repair -will determine need for surg eval here if seems acutely symptomatic  -check pa/lat cxr in am  -pain control/antiemetics prn, supportive care  -ISS  -sw/cats c/s    Addendum:  Dr. Lequita Halt reviewed CT a/p from OSH with our radiology here who prelim noted large ascites, somewhat lrg/distended GB w/o overt signs cholecystitis, mild L colonic wall thickening, L thigh hematoma  -lactate 4.7-will hydrate and check q6h until normalizes-hold off abx for now but low threshold cover  empirically for SBP/colitis/enteric pathogens if spikes fevers or any sig decompensation--of note, pt w pcn allergy  -decide on xray L hip given recent fall/hematoma  -will check RUQ u/s and decide on need surgical c/s    FOLEY: none  DVT prophylaxis: scds alone for now    Disposition:   Today's date: 02/22/2015  Admit Date: 02/22/2015  3:12 PM  Anticipated medical stability for discharge:Red - not tomorrow - estimated month/date: 6/9  Service status: INPT  Reason for ongoing hospitalization: decompensated cirrhosis, etoh wd  Anticipated discharge needs: tbd, needs pmd, tcm appt    Kearie Mennen Maretta Bees, MD

## 2015-02-22 NOTE — ED Provider Notes (Signed)
Physician/Midlevel provider first contact with patient: 02/22/15 1524                                      [facility] [place] EMERGENCY DEPARTMENT H&P         CLINICAL SUMMARY          Diagnosis:    .     Final diagnoses:   Liver failure without hepatic coma, unspecified chronicity   Alcohol withdrawal, with unspecified complication   Hyperbilirubinemia   Tachycardia   Altered mental status, unspecified altered mental status type   Lactic acidosis   Anemia, unspecified type         MDM Notes:    39 y.o. male with h/o alcohol abuse, has increased bilirubin level over 7, with AMS, suggestive of worsening liver failure. Labs also notable for anemia at 6.5 for which he received a blood transfusion.  The hip pain was a muscular hematoma 2/2 his mechanical falls.  Lactic acidosis with level at 4.7 for which he received fluid hydration.   Admitting for management of liver failure and altered mental status.          Disposition:          ED Disposition     Admit Bed Type: Intermediate Care [11]  Admitting Physician: Nilsa Nutting [facility] [[id number]]  Patient Class: Inpatient [101]                      CLINICAL INFORMATION        HPI:      Chief Complaint: Hip Pain; Jaundice; Nausea; and Emesis  .    Gregory [name] is a 39 y.o. male who presents with jaundice, left flank pain, and emesis. Pt reports that flank pain is new to him and onset last week after he got off an air mattress and felt a pop. Pt is sluggishly responsive on exam and reports that pain does not radiate. Pt was transferred from [facility] ED; h/o liver failure and EtOH abuse. Per EMS, pt reports that he consumed alcohol at a wedding yesterday.   BC 9.7 last week dropped to a 7/6 at [facility] ED today.     History obtained from: patient, review of prior chart, nurse          ROS:      Review of Systems   Constitutional: Positive for malaise/fatigue. Negative for fever, chills, weight loss and diaphoresis.   HENT: Negative.  Negative for nosebleeds.    Eyes:  Negative.    Respiratory: Negative.  Negative for cough and shortness of breath.    Cardiovascular: Negative for chest pain, palpitations, orthopnea, claudication, leg swelling and PND.        Tachycardia, hypotension   Gastrointestinal: Positive for vomiting. Negative for heartburn, nausea, abdominal pain, diarrhea, constipation, blood in stool and melena.   Genitourinary: Positive for flank pain. Negative for hematuria.   Musculoskeletal: Positive for back pain. Negative for myalgias, joint pain, falls and neck pain.   Skin: Negative for itching and rash.        Jaundice   Neurological: Positive for speech change. Negative for dizziness, tingling, tremors, sensory change, focal weakness, seizures, loss of consciousness, weakness and headaches.        Decreased responsiveness  Moving all 4 extremities bilaterally   Psychiatric/Behavioral: Negative.  Negative for depression, suicidal ideas and hallucinations.   All other  systems reviewed and are negative.          Physical Exam:      Pulse (!) 143  BP 135/73 mmHg  Resp 18  SpO2 95 %  Temp 97.8 F (36.6 C)    Physical Exam   Constitutional: He is oriented to person, place, and time. He appears well-developed and well-nourished. No distress.   Sleepy, sluggishly responsive, mumbling in exam   HENT:   Head: Normocephalic and atraumatic.   Eyes: Conjunctivae and EOM are normal. Pupils are equal, round, and reactive to light.   Neck: Normal range of motion. Neck supple. No JVD present. No tracheal deviation present. No thyromegaly present.   Cardiovascular: Normal rate, regular rhythm, normal heart sounds and intact distal pulses.  Exam reveals no gallop and no friction rub.    No murmur heard.  Tachycardia   Pulmonary/Chest: Effort normal and breath sounds normal. No respiratory distress. He has no wheezes. He has no rales. He exhibits no tenderness.   Abdominal: Soft. Bowel sounds are normal. He exhibits no distension and no mass. There is tenderness. There is no  rebound and no guarding.   Tenderness in the left flank   Musculoskeletal: Normal range of motion. He exhibits no edema.   Neurological: He is alert and oriented to person, place, and time. He has normal reflexes.   Skin: Skin is warm and dry. He is not diaphoretic.   Jaundiced all over   Psychiatric: He has a normal mood and affect. His behavior is normal. Judgment and thought content normal.   Nursing note and vitals reviewed.                 PAST HISTORY        Primary Care Provider: Christa See, MD        PMH/PSH:    .     Past Medical History   Diagnosis Date   . Thyroid disease    . Cirrhosis        He has past surgical history that includes Appendectomy and Knee surgery.      Social/Family History:      He reports that he quit smoking about 4 weeks ago. His smoking use included Cigarettes. He smoked 0.50 packs per day. He has never used smokeless tobacco. He reports that he drinks about 25.2 oz of alcohol per week. He reports that he does not use illicit drugs.    Family History   Problem Relation Age of Onset   . Diabetes Mother    . Diabetes Father    . Diabetes Maternal Grandmother    . Diabetes Maternal Grandfather    . Diabetes Paternal Grandmother    . Diabetes Paternal Grandfather          Listed Medications on Arrival:    .     Home Medications                   LORazepam (ATIVAN) 1 MG tablet     Take 1 mg by mouth every 6 (six) hours as needed for Anxiety.     oxyCODONE (OXY-IR) 5 MG capsule     Take 5 mg by mouth every 4 (four) hours as needed.         Allergies: He is allergic to penicillins.            VISIT INFORMATION        Clinical Course in the ED:      5:08  PM Paged hospitalist  5:15 PM Spoke to hospitalist, recommends [facility], will admit pt to [facility]      Medications Given in the ED:    .     ED Medication Orders     Start Ordered     Status Ordering Provider    02/22/15 2044 02/22/15 2043  sodium chloride 0.9 % bolus 1,000 mL   Once     Route: Intravenous  Ordered Dose: 1,000 mL     Last  MAR action:  New Bag Exavier Lina N    02/22/15 1820 02/22/15 1819  lactulose (CHRONULAC) 10 GM/15ML solution 30 g   Once     Route: Oral  Ordered Dose: 30 g     Last MAR action:  Hold Angel Weedon N    02/22/15 1707 02/22/15 1709  0.9%  NaCl infusion   As needed     Route: Intravenous     Acknowledged Aloria Looper N    02/22/15 1648 02/22/15 1647  LORazepam (ATIVAN) injection 1 mg   Once     Route: Intravenous  Ordered Dose: 1 mg     Last MAR action:  Given Adeena Bernabe N    02/22/15 1601 02/22/15 1600  LORazepam (ATIVAN) injection 1 mg   Once     Route: Intravenous  Ordered Dose: 1 mg     Last MAR action:  Given Tenna Lacko N    02/22/15 1601 02/22/15 1600  ondansetron (ZOFRAN) injection 4 mg   Once     Route: Intravenous  Ordered Dose: 4 mg     Last MAR action:  Override Pull Cleaster Shiffer N    02/22/15 1601 02/22/15 1600  promethazine (PHENERGAN) injection 12.5 mg   Once     Route: Intravenous  Ordered Dose: 12.5 mg     Last MAR action:  Given Tj Kitchings N    02/22/15 1523 02/22/15 1522  sodium chloride 0.9 % bolus 1,000 mL   Once     Route: Intravenous  Ordered Dose: 1,000 mL     Last MAR action:  Stopped Arrabella Westerman N    02/22/15 1523 02/22/15 1522  LORazepam (ATIVAN) injection 1 mg   Once     Route: Intravenous  Ordered Dose: 1 mg     Last MAR action:  Given Jaythan Hinely N            Procedures:      Procedures      Interpretations:      O2 sat-           saturation: 95 %; Oxygen use: room air; Interpretation: borderline normal    Radiology -     interpreted by me with the following observations: Imaging remarkable for left pleural fluid thickening and intra muscular hematoma.   EKG -             interpreted by me: sinus tachycardia 128, normal PQRS, no stemi  Monitor -         interpreted by me: sinus tachycardia at 130.  DDx-              Initial differential diagnosis included: liver failure, plan is labs, EtOH screen, review imaging, EKG            RESULTS        Lab Results:      Results     Procedure Component Value  Units Date/Time    CBC and differential [[id number]]  (Abnormal) Collected:  02/23/15 0024    Specimen Information:  Blood from Blood Updated:  02/23/15 0143     WBC 6.94 x10 3/uL      Hgb 6.5 (L) g/dL      Hematocrit 91.4 (L) %      Platelets 47 (L) x10 3/uL      RBC 1.94 (L) x10 6/uL      MCV 92.8 fL      MCH 33.5 (H) pg      MCHC 36.1 (H) g/dL      RDW 22 (H) %      MPV 11.6 fL      Neutrophils 60 %      Lymphocytes Automated 23 %      Monocytes 16 %      Eosinophils Automated 1 %      Basophils Automated 0 %      Immature Granulocyte 0 %      Nucleated RBC 0 /100 WBC      Neutrophils Absolute 4.17 x10 3/uL      Abs Lymph Automated 1.58 x10 3/uL      Abs Mono Automated 1.12 x10 3/uL      Abs Eos Automated 0.04 x10 3/uL      Absolute Baso Automated 0.01 x10 3/uL      Absolute Immature Granulocyte 0.02 x10 3/uL     GFR [[id number]] Collected:  02/23/15 0039     EGFR 56.1 Updated:  02/23/15 0113    Comprehensive metabolic panel [[id number]]  (Abnormal) Collected:  02/23/15 0039    Specimen Information:  Blood Updated:  02/23/15 0113     Glucose 96 mg/dL      BUN 62.9 mg/dL      Creatinine 1.4 (H) mg/dL      Sodium 528 (L) mEq/L      Potassium 4.4 mEq/L      Chloride 107 mEq/L      CO2 19 (L) mEq/L      Calcium 7.1 (L) mg/dL      Protein, Total 6.8 g/dL      Albumin 2.0 (L) g/dL      AST (SGOT) 413 (H) U/L      ALT 29 U/L      Alkaline Phosphatase 130 (H) U/L      Bilirubin, Total 7.5 (H) mg/dL      Globulin 4.8 (H) g/dL      Albumin/Globulin Ratio 0.4 (L)     Lactic acid, plasma [[id number]]  (Abnormal) Collected:  02/23/15 0024    Specimen Information:  Blood Updated:  02/23/15 0057     Lactic acid 3.0 (H) mmol/L     Prepare RBC: one unit RBC [[id number]] Collected:  02/22/15 1543     RBC Leukoreduced RBC Leukoreduced Updated:  02/23/15 0042     BLUNIT [id number]      Status transfused      PRODUCT CODE (NON READABLE) E0226V00      Expiration Date [id number]      UTYPE O NEG     CBC with differential [[id number]]   (Abnormal) Collected:  02/22/15 1536    Specimen Information:  Blood from Blood Updated:  02/22/15 2239     WBC 9.54 x10 3/uL      Hgb 6.5 (L) g/dL      Hematocrit 93.2 (L) %      Platelets 64 (L) x10 3/uL      RBC 1.88 (L) x10 6/uL      MCV 95.7 fL  MCH 34.6 (H) pg      MCHC 36.1 (H) g/dL      RDW 22 (H) %      MPV 10.5 fL     T3, free [[id number]] Collected:  02/22/15 1536    Specimen Information:  Blood Updated:  02/22/15 2128     T3, Free 1.71 pg/mL     Ammonia [[id number]] Collected:  02/22/15 2049    Specimen Information:  Blood Updated:  02/22/15 2121     Ammonia 37 umol/L     Narrative:      Starting For 1 Occurrences  Drug Screen, Urine Random    CBC with Differential [[id number]]  (Abnormal) Collected:  02/22/15 1931    Specimen Information:  Blood from Blood Updated:  02/22/15 1947     WBC 9.35 x10 3/uL      Hgb 7.4 (L) g/dL      Hematocrit 08.6 (L) %      Platelets 58 (L) x10 3/uL      RBC 2.19 (L) x10 6/uL      MCV 94.1 fL      MCH 33.8 (H) pg      MCHC 35.9 g/dL      RDW 21 (H) %      MPV 11.2 fL      Neutrophils 66 %      Lymphocytes Automated 15 %      Monocytes 18 %      Eosinophils Automated 0 %      Basophils Automated 0 %      Immature Granulocyte 0 %      Nucleated RBC 0 /100 WBC      Neutrophils Absolute 6.21 x10 3/uL      Abs Lymph Automated 1.43 x10 3/uL      Abs Mono Automated 1.66 (H) x10 3/uL      Abs Eos Automated 0.01 x10 3/uL      Absolute Baso Automated 0.02 x10 3/uL      Absolute Immature Granulocyte 0.02 x10 3/uL     APTT [[id number]]  (Abnormal) Collected:  02/22/15 1836     PTT 51 (H) sec Updated:  02/22/15 1918    Prothrombin time/INR [[id number]]  (Abnormal) Collected:  02/22/15 1836    Specimen Information:  Blood Updated:  02/22/15 1918     PT 24.8 (H) sec      PT INR 2.3 (H)      PT Anticoag. Given Within 48 hrs. None     Lactic acid, plasma [[id number]]  (Abnormal) Collected:  02/22/15 1836    Specimen Information:  Blood Updated:  02/22/15 1909     Lactic acid 4.7 (HH) mmol/L      Ammonia [[id number]] Collected:  02/22/15 1836    Specimen Information:  Blood Updated:  02/22/15 1908     Ammonia 31 umol/L     T4, free [[id number]] Collected:  02/22/15 1536    Specimen Information:  Blood Updated:  02/22/15 1858     T4 Free 1.06 ng/dL     Bilirubin, direct [[id number]]  (Abnormal) Collected:  02/22/15 1536    Specimen Information:  Blood Updated:  02/22/15 1837     Bilirubin, Direct 5.3 (H) mg/dL     Lipase [[id number]] Collected:  02/22/15 1536     Lipase 66 U/L Updated:  02/22/15 1837    Type and Screen [[id number]] Collected:  02/22/15 1543    Specimen Information:  Blood Updated:  02/22/15 1716     ABO Rh  O POS      AB Screen Gel NEG     Narrative:      ?Number of units->1  ?Special Requirements->No special requirements  ?Transfusion Criteria->Hgb <7g/dL or Hct <09% in symptomatic,  ?hemodynamically stable patient  ?Has the patient been transfused w/i the last 3  ?months?->Unknown    Manual Differential [[id number]]  (Abnormal) Collected:  02/22/15 1536     Segmented Neutrophils 88 % Updated:  02/22/15 1713     Band Neutrophils 1 %      Lymphocytes Manual 7 %      Monocytes Manual 4 %      Eosinophils Manual 0 %      Basophils Manual 0 %      Nucleated RBC 0 /100 WBC      Abs Seg Manual 8.40 (H) x10 3/uL      Bands Absolute 0.10 x10 3/uL      Absolute Lymph Manual 0.67 x10 3/uL      Monocytes Absolute 0.38 x10 3/uL      Absolute Eos Manual 0.00 x10 3/uL      Absolute Baso Manual 0.00 x10 3/uL     Cell MorpHology [[id number]]  (Abnormal) Collected:  02/22/15 1536     Cell Morphology: Abnormal (A) Updated:  02/22/15 1713     Macrocytic =2+ (A)      Target Cells =1+ (A)      Ovalocytes =1+ (A)      Burr Cells =1+ (A)      Rouleaux =1+ (A)      Tear Drop Cells =1+ (A)      Platelet Estimate Decreased (A)     TSH [[id number]]  (Abnormal) Collected:  02/22/15 1536     Thyroid Stimulating Hormone 6.70 (H) uIU/mL Updated:  02/22/15 1652    Comprehensive metabolic panel [[id number]]  (Abnormal) Collected:   02/22/15 1536    Specimen Information:  Blood Updated:  02/22/15 1633     Glucose 121 (H) mg/dL      BUN 9.0 mg/dL      Creatinine 1.4 (H) mg/dL      Sodium 010 (L) mEq/L      Potassium 4.4 mEq/L      Chloride 104 mEq/L      CO2 17 (L) mEq/L      Calcium 7.8 (L) mg/dL      Protein, Total 7.4 g/dL      Albumin 2.2 (L) g/dL      AST (SGOT) 272 (H) U/L      ALT 33 U/L      Alkaline Phosphatase 142 (H) U/L      Bilirubin, Total 7.7 (H) mg/dL      Globulin 5.2 (H) g/dL      Albumin/Globulin Ratio 0.4 (L)     Magnesium [[id number]] Collected:  02/22/15 1536    Specimen Information:  Blood Updated:  02/22/15 1633     Magnesium 1.6 mg/dL     GFR [[id number]] Collected:  02/22/15 1536     EGFR 56.1 Updated:  02/22/15 1633    Ethanol (Alcohol) Level [[id number]]  (Abnormal) Collected:  02/22/15 1536     Alcohol 70 (H) mg/dL Updated:  18/84/16 6063    Acetaminophen level [[id number]]  (Abnormal) Collected:  02/22/15 1536     Acetaminophen Level <6 (L) ug/mL Updated:  02/22/15 1633    Salicylate level [[id number]]  (Abnormal) Collected:  02/22/15 1536     Salicylate Level <5.0 (L) mg/dL Updated:  54/27/06 2376  Salicylate level [[id number]] Collected:  02/22/15 1543    Specimen Information:  Blood Updated:  02/22/15 1544    Ethanol (Alcohol) Level [[id number]] Collected:  02/22/15 1543    Specimen Information:  Blood Updated:  02/22/15 1544    Narrative:      No alcohol for draw    TSH [[id number]] Collected:  02/22/15 1543    Specimen Information:  Blood Updated:  02/22/15 1544    Acetaminophen Level [[id number]] Collected:  02/22/15 1543    Specimen Information:  Blood Updated:  02/22/15 1544              Radiology Results:      XR Chest  AP Portable   Final Result   Impression:   1. Left basilar airspace disease and right basilar atelectasis.   2. Left pleural fluid/thickening.      Pennelope Bracken, MD    02/22/2015 4:17 PM         CT Imported Outside Images   Preliminary Result      US Abdomen Limited Ruq    (Results Pending)                Scribe Attestation:      I was acting as a scribe for Sharion Settler, MD PHD on [facility]    Corrina kelliher    I am the first provider for this patient and I personally performed the services documented. Corrina Kelliher  is scribing for me on [employer]. This note accurately reflects work and decisions made by me.  Sharion Settler, MD PHD           Sharion Settler, MD PHD  02/23/15 [phone]

## 2015-02-22 NOTE — H&P (Signed)
ADMISSION HISTORY AND PHYSICAL EXAM    Date Time: 02/22/2015 8:53 PM  Patient Name: Gregory [name]  Attending Physician: Sharion Settler, MD PHD  Primary Care Physician: Christa See, MD    CC: AMS, nausea, vomiting, significant anemia      Assessment:   Active Problems:    Liver failure    Liver failure without hepatic coma, unspecified chronicity    Alcohol withdrawal, with unspecified complication    Tachycardia    Altered mental status, unspecified altered mental status type    Increased ammonia level    39 year old male with past medical history significant for alcoholic cirrhosis, active alcohol abuse, recent concern for cholecystitis, pancreatitis, and left inguinal hernia presenting with altered mental status, nausea, vomiting, significant anemia likely due to acute blood loss.    Plan:   #Liver failure likely secondary to alcoholic cirrhosis  -Check ammonia level  -Lactulose 1 now   -Check coags  -Evidence of large ascites on outside CT, will order paracentesis. (No report of fever so was concerning for SBP; low threshold to start ceftriaxone if he spikes a fever)  -Meld score 24    #Anemia, likely acute blood loss  -Recent fall with large left sided hip hematoma - could be the source of anemia but cannot rule out GI etiology  -No evidence of hematemesis and no report of hematemesis. 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  -Receiving IV fluids    #Altered mental status, likely due to Dilaudid and Ativan given outside hospital but can also be due to hepatic encephalopathy - plan as above    #Thrombocytopenia  likely secondary to cirrhosis    #Nausea and vomiting, could be due to colitis found on CT scan  -Protonix as above  -Zofran as needed    #Hyperbilirubinemia likely secondary to alcoholic cirrhosis  -Distended gallbladder on outside CT scan, as well as documented recent historical concern for cholecystitis  -will order right upper quadrant ultrasound to further evaluate  -May need a surgery consult in the a.m.    #Global  -SCDs  -NPO  -Full code      History of Presenting Illness:   Gregory [name] is a 39 y.o. male who presents to the hospital with jaundice and emesis to an outside hospital. Per documentation, it appears that he had some left sided flank pain after a fall last week. He went to the emergency room at that time and had x-rays done which showed no fractures. He is transferred here from [facility] emergency room. On arrival he was obtunded, but it appears as though he received both Ativan and Dilaudid at the outside hospital. Per EMS reports he consumed alcohol at a wedding yesterday.    Unable to take adequate history at this time due to patient's mental status.    He had an CT scan done at the outside hospital which was reviewed with one of our radiologists here. Noted a distended gallbladder, large left hip intramuscular hematoma, large ascites, left-sided pleural effusion, no free air in the abdomen but evidence of  right colonic wall thickening concerning for colitis.    Past Medical History:     Past Medical History   Diagnosis Date   . Thyroid disease    . Cirrhosis        Available old records reviewed, including:  epic    Past Surgical History:     Past Surgical History   Procedure Laterality Date   . Appendectomy     . Knee surgery       Hardware placement R knee       Family History:     Family History   Problem Relation Age of Onset   . Diabetes Mother    . Diabetes Father    . Diabetes Maternal Grandmother    . Diabetes Maternal Grandfather    . Diabetes Paternal Grandmother    . Diabetes  Paternal Grandfather          Social History:     History   Smoking status   . Former Smoker -- 0.50 packs/day   . Types: Cigarettes   . Quit date: 01/22/2015   Smokeless tobacco   . Never Used     History   Alcohol Use   . 25.2 oz/week   . 42 Cans of beer per week     Comment: daily, 6 pack beer q day     History   Drug Use No       Allergies:     Allergies   Allergen Reactions   . Penicillins Hives       Medications:       Method by which medications were confirmed on admission: not verified    Review of Systems:   All other systems were reviewed and are negative except: as included above    Physical Exam:   Patient Vitals for the past 24 hrs:   BP Temp Temp src Pulse Resp SpO2 Height Weight   02/22/15 1900 154/73 mmHg - - (!) 110 - 100 % - -   02/22/15 1854 (!) 134/93 mmHg - - (!) 110 - 100 % - -   02/22/15 1845 (!) 134/93 mmHg 97.2 F (36.2 C) - (!) 105 14 - - -   02/22/15 1833 133/81 mmHg 97 F (36.1 C) - (!) 104 14 - - -   02/22/15 1830 133/81 mmHg - - (!) 108 - 100 % - -   02/22/15 1750 121/87 mmHg 97.2 F (36.2 C) - (!) 112 14 - - -   02/22/15 1737 123/85 mmHg 97.3 F (36.3 C) - (!) 112 14 - - -   02/22/15 1733 123/85 mmHg 97.3 F (36.3 C) - (!) 112 14 100 % - -   02/22/15 1724 (!) 136/95 mmHg - - (!) 112 - 100 % - -   02/22/15 1630 124/77 mmHg - - (!) 120 - 99 % - -   02/22/15 1534 124/81 mmHg 97.8 F (36.6 C) - (!) 133 16 - - -   02/22/15 1518 135/73 mmHg 97.8 F (36.6 C) Oral (!) 143 18 95 % 1.93 m (6\' 4" ) 81.647 kg (180 lb)     Body mass index is 21.92 kg/(m^2).    Intake/Output Summary (Last 24 hours) at 02/22/15 2053  Last data filed at 02/22/15 1845   Gross per 24 hour   Intake    510 ml   Output      0 ml   Net    510 ml  General: somnolent, arousable for a few moments but not long enough to hold a conversation.  HEENT: perrla, eomi, sclera icteric, oropharynx clear without lesions, mucous membranes dry  Cardiovascular: regular rate and rhythm, no murmurs, rubs or gallops  Lungs: clear to  auscultation bilaterally, without wheezing, rhonchi, or rales  Abdomen: soft, non-tender, non-distended; no palpable masses, normoactive bowel sounds, no rebound or guarding  Extremities: no clubbing, cyanosis, or edema  Neuro: unable to accurately access but evidence of tremors and moving all extremities spontaneously   Skin: jaundiced      Labs:     Results     Procedure Component Value Units Date/Time    Ammonia [[id number]] Collected:  02/22/15 2049    Specimen Information:  Blood Updated:  02/22/15 2049    Narrative:      Starting For 1 Occurrences  Drug Screen, Urine Random  On ice to Lab immed    CBC with Differential [[id number]]  (Abnormal) Collected:  02/22/15 1931    Specimen Information:  Blood from Blood Updated:  02/22/15 1947     WBC 9.35 x10 3/uL      Hgb 7.4 (L) g/dL      Hematocrit 44.0 (L) %      Platelets 58 (L) x10 3/uL      RBC 2.19 (L) x10 6/uL      MCV 94.1 fL      MCH 33.8 (H) pg      MCHC 35.9 g/dL      RDW 21 (H) %      MPV 11.2 fL      Neutrophils 66 %      Lymphocytes Automated 15 %      Monocytes 18 %      Eosinophils Automated 0 %      Basophils Automated 0 %      Immature Granulocyte 0 %      Nucleated RBC 0 /100 WBC      Neutrophils Absolute 6.21 x10 3/uL      Abs Lymph Automated 1.43 x10 3/uL      Abs Mono Automated 1.66 (H) x10 3/uL      Abs Eos Automated 0.01 x10 3/uL      Absolute Baso Automated 0.02 x10 3/uL      Absolute Immature Granulocyte 0.02 x10 3/uL     T3, free [[id number]] Collected:  02/22/15 1536    Specimen Information:  Blood Updated:  02/22/15 1933    APTT [[id number]]  (Abnormal) Collected:  02/22/15 1836     PTT 51 (H) sec Updated:  02/22/15 1918    Prothrombin time/INR [[id number]]  (Abnormal) Collected:  02/22/15 1836    Specimen Information:  Blood Updated:  02/22/15 1918     PT 24.8 (H) sec      PT INR 2.3 (H)      PT Anticoag. Given Within 48 hrs. None     Lactic acid, plasma [[id number]]  (Abnormal) Collected:  02/22/15 1836    Specimen Information:  Blood  Updated:  02/22/15 1909     Lactic acid 4.7 (HH) mmol/L     Ammonia [[id number]] Collected:  02/22/15 1836    Specimen Information:  Blood Updated:  02/22/15 1908     Ammonia 31 umol/L     T4, free [[id number]] Collected:  02/22/15 1536    Specimen Information:  Blood Updated:  02/22/15 1858     T4 Free 1.06 ng/dL     Bilirubin, direct [[id number]]  (Abnormal) Collected:  02/22/15  1536    Specimen Information:  Blood Updated:  02/22/15 1837     Bilirubin, Direct 5.3 (H) mg/dL     Lipase [[id number]] Collected:  02/22/15 1536     Lipase 66 U/L Updated:  02/22/15 1837    Prepare RBC: one unit RBC [[id number]] Collected:  02/22/15 1543     RBC Leukoreduced RBC Leukoreduced Updated:  02/22/15 1725     BLUNIT [id number]      Status issued      PRODUCT CODE (NON READABLE) [id number]      Expiration Date [id number]      UTYPE O NEG     Type and Screen [[id number]] Collected:  02/22/15 1543    Specimen Information:  Blood Updated:  02/22/15 1716     ABO Rh O POS      AB Screen Gel NEG     Narrative:      ?Number of units->1  ?Special Requirements->No special requirements  ?Transfusion Criteria->Hgb <7g/dL or Hct <72% in symptomatic,  ?hemodynamically stable patient  ?Has the patient been transfused w/i the last 3  ?months?->Unknown    Manual Differential [[id number]]  (Abnormal) Collected:  02/22/15 1536     Segmented Neutrophils 88 % Updated:  02/22/15 1713     Band Neutrophils 1 %      Lymphocytes Manual 7 %      Monocytes Manual 4 %      Eosinophils Manual 0 %      Basophils Manual 0 %      Nucleated RBC 0 /100 WBC      Abs Seg Manual 8.40 (H) x10 3/uL      Bands Absolute 0.10 x10 3/uL      Absolute Lymph Manual 0.67 x10 3/uL      Monocytes Absolute 0.38 x10 3/uL      Absolute Eos Manual 0.00 x10 3/uL      Absolute Baso Manual 0.00 x10 3/uL     Cell MorpHology [[id number]]  (Abnormal) Collected:  02/22/15 1536     Cell Morphology: Abnormal (A) Updated:  02/22/15 1713     Macrocytic =2+ (A)      Target Cells =1+ (A)       Ovalocytes =1+ (A)      Burr Cells =1+ (A)      Rouleaux =1+ (A)      Tear Drop Cells =1+ (A)      Platelet Estimate Decreased (A)     CBC with differential [[id number]]  (Abnormal) Collected:  02/22/15 1536    Specimen Information:  Blood from Blood Updated:  02/22/15 1713     WBC 9.54 x10 3/uL      Hgb 6.5 (L) g/dL      Hematocrit 18.8 (L) %      Platelets 64 (L) x10 3/uL      RBC 1.88 (L) x10 6/uL      MCV 95.7 fL      MCH 34.6 (H) pg      MCHC 36.1 (H) g/dL      RDW 22 (H) %      MPV 10.5 fL     TSH [[id number]]  (Abnormal) Collected:  02/22/15 1536     Thyroid Stimulating Hormone 6.70 (H) uIU/mL Updated:  02/22/15 1652    Comprehensive metabolic panel [[id number]]  (Abnormal) Collected:  02/22/15 1536    Specimen Information:  Blood Updated:  02/22/15 1633     Glucose 121 (H) mg/dL      BUN 9.0 mg/dL  Creatinine 1.4 (H) mg/dL      Sodium 811 (L) mEq/L      Potassium 4.4 mEq/L      Chloride 104 mEq/L      CO2 17 (L) mEq/L      Calcium 7.8 (L) mg/dL      Protein, Total 7.4 g/dL      Albumin 2.2 (L) g/dL      AST (SGOT) 914 (H) U/L      ALT 33 U/L      Alkaline Phosphatase 142 (H) U/L      Bilirubin, Total 7.7 (H) mg/dL      Globulin 5.2 (H) g/dL      Albumin/Globulin Ratio 0.4 (L)     Magnesium [[id number]] Collected:  02/22/15 1536    Specimen Information:  Blood Updated:  02/22/15 1633     Magnesium 1.6 mg/dL     GFR [[id number]] Collected:  02/22/15 1536     EGFR 56.1 Updated:  02/22/15 1633    Ethanol (Alcohol) Level [[id number]]  (Abnormal) Collected:  02/22/15 1536     Alcohol 70 (H) mg/dL Updated:  24/40/10 2725    Acetaminophen level [[id number]]  (Abnormal) Collected:  02/22/15 1536     Acetaminophen Level <6 (L) ug/mL Updated:  02/22/15 1633    Salicylate level [[id number]]  (Abnormal) Collected:  02/22/15 1536     Salicylate Level <5.0 (L) mg/dL Updated:  56/43/32 9518    Salicylate level [[id number]] Collected:  02/22/15 1543    Specimen Information:  Blood Updated:  02/22/15 1544    Ethanol (Alcohol)  Level [[id number]] Collected:  02/22/15 1543    Specimen Information:  Blood Updated:  02/22/15 1544    Narrative:      No alcohol for draw    TSH [[id number]] Collected:  02/22/15 1543    Specimen Information:  Blood Updated:  02/22/15 1544    Acetaminophen Level [[id number]] Collected:  02/22/15 1543    Specimen Information:  Blood Updated:  02/22/15 1544          Imaging personally reviewed, including: Xr Chest  Ap Portable    02/22/2015   Impression: 1. Left basilar airspace disease and right basilar atelectasis. 2. Left pleural fluid/thickening.  Pennelope Bracken, MD  02/22/2015 4:17 PM         Safety Checklist  DVT prophylaxis:  CHEST guideline (See page e199S) Mechanical   Foley:  Gordon Rn Foley protocol Not present   IVs:  Peripheral IV   PT/OT: Ordered   Daily CBC & or Chem ordered:  SHM/ABIM guidelines (see #5) Yes, due to clinical and lab instability   Reference for approximate charges of common labs: CBC auto diff - $76  BMP - $99  Mg - $79         Signed by: Angelique Holm, MD   cc:Pcp, Octaviano Glow, MD

## 2015-02-22 NOTE — ED Notes (Signed)
Pt c/o L hip pain s/p falling out of a cot whilst camping memorial day weekend.  Pt sent from [place] to [facility] for tranfusion, pt pale, jaundiced, low H/H @ OSH, presenting with tremor, N/V and ascites.

## 2015-02-22 NOTE — ED Notes (Signed)
Bed: S 12  Expected date:   Expected time:   Means of arrival:   Comments:  C4 here

## 2015-02-22 NOTE — ED Notes (Signed)
Bed: S C4  Expected date:   Expected time:   Means of arrival:   Comments:  Emma, r

## 2015-02-22 NOTE — ED Notes (Signed)
MD at bedside. 

## 2015-02-23 ENCOUNTER — Other Ambulatory Visit: Payer: Self-pay

## 2015-02-23 ENCOUNTER — Inpatient Hospital Stay: Payer: Charity

## 2015-02-23 DIAGNOSIS — D649 Anemia, unspecified: Secondary | ICD-10-CM | POA: Diagnosis present

## 2015-02-23 DIAGNOSIS — E872 Acidosis, unspecified: Secondary | ICD-10-CM | POA: Diagnosis present

## 2015-02-23 LAB — GFR
EGFR: 56.1
EGFR: 56.1

## 2015-02-23 LAB — CBC
Hematocrit: 21 % — ABNORMAL LOW (ref 42.0–52.0)
Hematocrit: 20 % — ABNORMAL LOW (ref 42.0–52.0)
Hgb: 7.4 g/dL — ABNORMAL LOW (ref 13.0–17.0)
Hgb: 7.2 g/dL — ABNORMAL LOW (ref 13.0–17.0)
MCH: 32.3 pg — ABNORMAL HIGH (ref 28.0–32.0)
MCH: 33.3 pg — ABNORMAL HIGH (ref 28.0–32.0)
MCHC: 35.2 g/dL (ref 32.0–36.0)
MCHC: 36 g/dL (ref 32.0–36.0)
MCV: 91.7 fL (ref 80.0–100.0)
MCV: 92.6 fL (ref 80.0–100.0)
MPV: 11 fL (ref 9.4–12.3)
MPV: 11.8 fL (ref 9.4–12.3)
Nucleated RBC: 0 /100 WBC (ref 0–1)
Nucleated RBC: 0 /100 WBC (ref 0–1)
Platelets: 41 10*3/uL — ABNORMAL LOW (ref 140–400)
Platelets: 39 10*3/uL — ABNORMAL LOW (ref 140–400)
RBC: 2.29 10*6/uL — ABNORMAL LOW (ref 4.70–6.00)
RBC: 2.16 10*6/uL — ABNORMAL LOW (ref 4.70–6.00)
RDW: 21 % — ABNORMAL HIGH (ref 12–15)
RDW: 22 % — ABNORMAL HIGH (ref 12–15)
WBC: 7.27 10*3/uL (ref 3.50–10.80)
WBC: 7.05 10*3/uL (ref 3.50–10.80)

## 2015-02-23 LAB — MAGNESIUM: Magnesium: 1.8 mg/dL (ref 1.6–2.6)

## 2015-02-23 LAB — CBC AND DIFFERENTIAL
Basophils Absolute Automated: 0.01 10*3/uL (ref 0.00–0.20)
Basophils Automated: 0 %
Eosinophils Absolute Automated: 0.04 10*3/uL (ref 0.00–0.70)
Eosinophils Automated: 1 %
Hematocrit: 18 % — ABNORMAL LOW (ref 42.0–52.0)
Hgb: 6.5 g/dL — ABNORMAL LOW (ref 13.0–17.0)
Immature Granulocytes Absolute: 0.02 10*3/uL
Immature Granulocytes: 0 %
Lymphocytes Absolute Automated: 1.58 10*3/uL (ref 0.50–4.40)
Lymphocytes Automated: 23 %
MCH: 33.5 pg — ABNORMAL HIGH (ref 28.0–32.0)
MCHC: 36.1 g/dL — ABNORMAL HIGH (ref 32.0–36.0)
MCV: 92.8 fL (ref 80.0–100.0)
MPV: 11.6 fL (ref 9.4–12.3)
Monocytes Absolute Automated: 1.12 10*3/uL (ref 0.00–1.20)
Monocytes: 16 %
Neutrophils Absolute: 4.17 10*3/uL (ref 1.80–8.10)
Neutrophils: 60 %
Nucleated RBC: 0 /100 WBC (ref 0–1)
Platelets: 47 10*3/uL — ABNORMAL LOW (ref 140–400)
RBC: 1.94 10*6/uL — ABNORMAL LOW (ref 4.70–6.00)
RDW: 22 % — ABNORMAL HIGH (ref 12–15)
WBC: 6.94 10*3/uL (ref 3.50–10.80)

## 2015-02-23 LAB — COMPREHENSIVE METABOLIC PANEL
ALT: 29 U/L (ref 0–55)
ALT: 29 U/L (ref 0–55)
AST (SGOT): 216 U/L — ABNORMAL HIGH (ref 5–34)
AST (SGOT): 204 U/L — ABNORMAL HIGH (ref 5–34)
Albumin/Globulin Ratio: 0.4 — ABNORMAL LOW (ref 0.9–2.2)
Albumin/Globulin Ratio: 0.5 — ABNORMAL LOW (ref 0.9–2.2)
Albumin: 2 g/dL — ABNORMAL LOW (ref 3.5–5.0)
Albumin: 2.2 g/dL — ABNORMAL LOW (ref 3.5–5.0)
Alkaline Phosphatase: 130 U/L — ABNORMAL HIGH (ref 38–106)
Alkaline Phosphatase: 121 U/L — ABNORMAL HIGH (ref 38–106)
BUN: 11 mg/dL (ref 9.0–28.0)
BUN: 13 mg/dL (ref 9.0–28.0)
Bilirubin, Total: 7.5 mg/dL — ABNORMAL HIGH (ref 0.2–1.2)
Bilirubin, Total: 8.5 mg/dL — ABNORMAL HIGH (ref 0.2–1.2)
CO2: 19 mEq/L — ABNORMAL LOW (ref 22–29)
CO2: 20 mEq/L — ABNORMAL LOW (ref 22–29)
Calcium: 7.1 mg/dL — ABNORMAL LOW (ref 8.5–10.5)
Calcium: 7.7 mg/dL — ABNORMAL LOW (ref 8.5–10.5)
Chloride: 107 mEq/L (ref 100–111)
Chloride: 107 mEq/L (ref 100–111)
Creatinine: 1.4 mg/dL — ABNORMAL HIGH (ref 0.7–1.3)
Creatinine: 1.4 mg/dL — ABNORMAL HIGH (ref 0.7–1.3)
Globulin: 4.8 g/dL — ABNORMAL HIGH (ref 2.0–3.6)
Globulin: 4.5 g/dL — ABNORMAL HIGH (ref 2.0–3.6)
Glucose: 96 mg/dL (ref 70–100)
Glucose: 95 mg/dL (ref 70–100)
Potassium: 4.4 mEq/L (ref 3.5–5.1)
Potassium: 3.7 mEq/L (ref 3.5–5.1)
Protein, Total: 6.8 g/dL (ref 6.0–8.3)
Protein, Total: 6.7 g/dL (ref 6.0–8.3)
Sodium: 135 mEq/L — ABNORMAL LOW (ref 136–145)
Sodium: 136 mEq/L (ref 136–145)

## 2015-02-23 LAB — PREPARE FRESH FROZEN PLASMA
Expiration Date: 201606101326
Expiration Date: 201606101326
Status: TRANSFUSED
Status: TRANSFUSED
UTYPE: A POS
UTYPE: A POS

## 2015-02-23 LAB — PREPARE PLATELETS
Expiration Date: 201606062359
Status: TRANSFUSED
UTYPE: AB POS

## 2015-02-23 LAB — PREPARE RBC
Expiration Date: 201606102359
Expiration Date: 201606142359
Status: TRANSFUSED
Status: TRANSFUSED
UTYPE: O NEG
UTYPE: O NEG

## 2015-02-23 LAB — ECG 12-LEAD
Atrial Rate: 128 {beats}/min
P Axis: 46 degrees
P-R Interval: 142 ms
Q-T Interval: 328 ms
QRS Duration: 92 ms
QTC Calculation (Bezet): 478 ms
R Axis: 6 degrees
T Axis: 46 degrees
Ventricular Rate: 128 {beats}/min

## 2015-02-23 LAB — PHOSPHORUS: Phosphorus: 3.8 mg/dL (ref 2.3–4.7)

## 2015-02-23 LAB — HEPATIC FUNCTION PANEL
Bilirubin Direct: 5 mg/dL — ABNORMAL HIGH (ref 0.0–0.5)
Bilirubin Indirect: 3.5 mg/dL — ABNORMAL HIGH (ref 0.0–1.1)

## 2015-02-23 LAB — PT/INR
PT INR: 2.1 — ABNORMAL HIGH (ref 0.9–1.1)
PT: 23.3 s — ABNORMAL HIGH (ref 12.6–15.0)

## 2015-02-23 LAB — LACTIC ACID, PLASMA
Lactic Acid: 2 mmol/L (ref 0.2–2.0)
Lactic Acid: 2.3 mmol/L — ABNORMAL HIGH (ref 0.2–2.0)
Lactic Acid: 3 mmol/L — ABNORMAL HIGH (ref 0.2–2.0)

## 2015-02-23 MED ORDER — SODIUM CHLORIDE 0.9 % IV SOLN
INTRAVENOUS | Status: DC
Start: 2015-02-23 — End: 2015-02-23

## 2015-02-23 MED ORDER — MAGNESIUM SULFATE IN D5W 10-5 MG/ML-% IV SOLN
1.0000 g | Freq: Once | INTRAVENOUS | Status: AC
Start: 2015-02-23 — End: 2015-02-23
  Administered 2015-02-23: 1 g via INTRAVENOUS
  Filled 2015-02-23: qty 100

## 2015-02-23 MED ORDER — OXYCODONE HCL 5 MG PO TABS
5.0000 mg | ORAL_TABLET | Freq: Four times a day (QID) | ORAL | Status: DC | PRN
Start: 2015-02-23 — End: 2015-02-23
  Administered 2015-02-23: 5 mg via ORAL
  Filled 2015-02-23: qty 1

## 2015-02-23 MED ORDER — SODIUM CHLORIDE 0.9 % IV SOLN
INTRAVENOUS | Status: DC | PRN
Start: 2015-02-23 — End: 2015-02-24

## 2015-02-23 MED ORDER — SODIUM CHLORIDE 0.9 % IV MBP
1.0000 g | INTRAVENOUS | Status: DC
Start: 2015-02-23 — End: 2015-02-25
  Administered 2015-02-23 – 2015-02-25 (×3): 1 g via INTRAVENOUS
  Filled 2015-02-23 (×3): qty 1000

## 2015-02-23 MED ORDER — LACTULOSE 10 GM/15ML PO SOLN
20.0000 g | Freq: Four times a day (QID) | ORAL | Status: DC
Start: 2015-02-23 — End: 2015-02-24
  Administered 2015-02-23 – 2015-02-24 (×3): 20 g via ORAL
  Filled 2015-02-23 (×3): qty 30

## 2015-02-23 MED ORDER — ACETAMINOPHEN 325 MG PO TABS
325.0000 mg | ORAL_TABLET | ORAL | Status: DC | PRN
Start: 2015-02-23 — End: 2015-03-10
  Administered 2015-02-26 – 2015-03-05 (×12): 325 mg via ORAL
  Filled 2015-02-23 (×12): qty 1

## 2015-02-23 MED ORDER — RIFAXIMIN 550 MG PO TABS
550.0000 mg | ORAL_TABLET | Freq: Two times a day (BID) | ORAL | Status: DC
Start: 2015-02-23 — End: 2015-03-10
  Administered 2015-02-23 – 2015-03-10 (×29): 550 mg via ORAL
  Filled 2015-02-23 (×31): qty 1

## 2015-02-23 MED ORDER — SODIUM CHLORIDE 0.9 % IV SOLN
INTRAVENOUS | Status: DC | PRN
Start: 2015-02-23 — End: 2015-02-25

## 2015-02-23 MED ORDER — TRAMADOL HCL 50 MG PO TABS
25.0000 mg | ORAL_TABLET | Freq: Four times a day (QID) | ORAL | Status: DC | PRN
Start: 2015-02-23 — End: 2015-02-28
  Administered 2015-02-24 – 2015-02-28 (×5): 25 mg via ORAL
  Filled 2015-02-23 (×5): qty 1

## 2015-02-23 NOTE — Progress Note - Problem Oriented Charting Notewrit (Signed)
MEDICINE PROGRESS NOTE    Date Time: 02/23/2015 6:08 AM  Patient Name: Gregory [name]  Attending Physician: Kathaleen Grinder, *   Medicine Resident Team A  Spectra: [phone]  Night: [phone]    Attention CONSULTANTS:   This patient is covered by the internal medicine teaching service.   Except in cases of emergency, please contact the medicine intern   or resident first for any questions or to place orders.      Assessment/Plan:   39yo gentleman with history of alcoholic cirrhosis, who was transferred from [facility] ED on 6/5 with depressed mental status and found to have anemia concerning for acute blood loss, as well as ascites and biliary sludge.  Mental status slowly improving, but still with significant anemia requiring blood product transfusion.        #Anemia, possible acute blood loss  No known history of hematemesis.  Recent fall with left sided hip trauma with L thigh hematoma noted on OSH CT could be the source of anemia but cannot rule out GI bleed as pt reports taking ibuprofen for thigh pain at home.  - GI consulted - f/u recs  - CBC Q8h, transfuse for Hb<7.   - Protonix IV 40mg  BID    #L thigh hematoma  Recent fall with left sided hip trauma with L thigh hematoma noted on OSH CT  - Neurovascular checks of LLE  - If persistent thigh pain, consider imaging and ortho consult  - Oxycodone 5mg  Q6h PRN for pain     #LFT elevation, likely secondary to alcoholic cirrhosis with large ascites noted on outside CT  INR elevation to 2.3.  MELD score 24.    - Diagnostic and therapeutic paracentesis ordered, although may not have sufficient ascites to tap per RUQ [place] - will order FFP and PLT transfuse to reverse INR elevation and thrombocytopenia in preparation  - Ceftriaxone 1g Q24h x5 days for SBP prophylaxis    #Active alcohol abuse  - CIWA protocol  - PO multivitamin, folic acid, and thiamine    #Metabolic acidosis   Likely from lactic acidosis secondary to liver disease, low suspicion for active  bacterial infection with no fever. Improving with fluids.   - Monitor Q8h lactate    - Continue gentle hydration with NS 50cc/h    #AKI  Suspect hepatorenal syndrome  - Cirrhosis management as above. Continue gentle hydration for now, monitor BMP daily.     #Altered mental status  - Likely from dilaudid and ativan given outside hospital but can rule out hepatic encephalopathy.  Improving. Avoid sedating meds as much as possible.     #Thrombocytopenia likely secondary to cirrhosis  - Monitor with daily CBC    #Distended gallbladder on outside CT scan  6/6 RUQ US showed mobile sludge seen within a distended gallbladder and edematous gallbladder wall is edematous (which may be partially due to the surrounding ascites), no gallstone.    - Touch base with GI if HIDA scan is indicated    FEN: gentle hydration with NS 50cc/h, advance to clear liquid diet given improved mental status and no procedures planned today  Ppx: SCD for DVT  Code: FULL (presumed)      Safety Checklist:     DVT prophylaxis:  CHEST guideline (See page e199S) Mechanical   Foley:  [facility]o Rn Foley protocol Not present   IVs:  Peripheral IV   PT/OT: Ordered   Daily CBC & or Chem ordered:  SHM/ABIM guidelines (see #5) Yes, due to  clinical and lab instability   Reference for approximate charges of common labs: CBC auto diff - $76  BMP - $99  Mg - $79    Disposition:     Anticipated discharge needs: TBD    Subjective     CC: anemia     24 hour events: Admitted to [facility]. Required multiple doses of ativan.  RUQ obtained this morning.     Subjective: Reports having awful night due to significant pain in L thigh - reports taking ibuprofen at home. No chest pain, abdominal pain, N/V, dyspnea.       Review of Systems:     As above    Physical Exam:     VITAL SIGNS PHYSICAL EXAM   Temp:  [97 F (36.1 C)-98 F (36.7 C)] 98 F (36.7 C)  Heart Rate:  [104-143] 110  Resp Rate:  [7-18] 17  BP: (104-154)/(69-95) 104/81 mmHg  SpO2: room air  Body mass index is 21.92  kg/(m^2).      Intake/Output Summary (Last 24 hours) at 02/23/15 0608  Last data filed at 02/23/15 0200   Gross per 24 hour   Intake    510 ml   Output    500 ml   Net     10 ml    GEN: thin jaundiced gentleman, appears in pain  HEENT: moist mucus membranes  CV: RRR, no murmurs, no JVD  PULM: ctab, no wheezes/crackles  ABDO: soft, non-tender, non-distended, no guarding/rebound  EXTREMITIES: no lower/upper extremity edema, warm to touch, no visible hematoma but mild swelling and significant tenderness L thigh       Meds:      Scheduled Meds: PRN Meds:      folic acid 1 mg Oral Daily   lactulose 30 g Oral Once   multivitamin 1 tablet Oral Daily   pantoprazole 40 mg Intravenous BID   thiamine 100 mg Oral Daily        sodium chloride  PRN   sodium chloride  PRN   LORazepam 2-4 mg PRN   naloxone 0.2 mg PRN   ondansetron 4 mg Q8H PRN   Or     ondansetron 4 mg Q8H PRN           Labs:     Labs (last 72 hours):      Recent Labs  Lab 02/23/15  0024 02/22/15  1931   WBC 6.94 9.35   HGB 6.5* 7.4*   HEMATOCRIT 18.0* 20.6*   PLATELETS 47* 58*         Recent Labs  Lab 02/22/15  1836 02/17/15  1942   PT 24.8* 19.7*   PT INR 2.3* 1.8*   PTT 51*  --       Recent Labs  Lab 02/23/15  0039 02/22/15  1536   SODIUM 135* 135*   POTASSIUM 4.4 4.4   CHLORIDE 107 104   CO2 19* 17*   BUN 11.0 9.0   CREATININE 1.4* 1.4*   CALCIUM 7.1* 7.8*   ALBUMIN 2.0* 2.2*   PROTEIN, TOTAL 6.8 7.4   BILIRUBIN, TOTAL 7.5* 7.7*   ALKALINE PHOSPHATASE 130* 142*   ALT 29 33   AST (SGOT) 216* 248*   GLUCOSE 96 121*          Microbiology, reviewed and significant for:  MRSAneg    Imaging, reviewed and significant for:  Xr Lumbar Spine Ap And Lateral    02/17/2015   IMPRESSION: No acute osseous abnormality of the  lumbar spine.   ReadingStation:[email or web address]    Xr Chest  Ap Portable    02/22/2015   Impression: 1. Left basilar airspace disease and right basilar atelectasis. 2. Left pleural fluid/thickening.  Pennelope Bracken, MD  02/22/2015 4:17 PM     Xr Pelvis  Portable    02/17/2015    No acute osseous abnormality of the pelvis.  ReadingStation:[email or web address]        Discussed with attending Dr. Arnell Sieving, Medicine PGY1 Pager [phone]

## 2015-02-23 NOTE — Progress Notes (Signed)
IM Attending Addendum:  I have seen and examined patient personally. I have discussed with housestaff, and reviewed and agree with findings and plan as per Dr. Lequita Halt with following caveats:    Impression:  -Obstructive jaundice/nausea with vomiting/abd pain--also prior hx of possible cholecystitis/pancreatitis/left inguinal hernia--no sig peritoneal signs, no sig ruq ttp/neg murphys  -Likely Decompensated Alcoholic cirrhosis with ascites  -Anemia-h/h 6.5/18.0--likely acute blood loss d/t L thigh hematoma after fall--now s/p 2u prbc w/o appropriate h/h improvment  -Thrombocytopenia  -SIRS (POA)/lactic acidosis-afebrile/no leukocytosis  -Acute etoh withdrawal-tremor (vs asterixis from liver failure/HE)  -Lethargy likely largely d/t ativan/dilaudid at OSH but eval HE/sepsis--improving  -AKI  -Elevated TSH  -Recent fall with L hip pain--has L thigh hematoma, no frx on xray at osh  -LLL ASD and RLL atelectasis + L pleural fluid/thickening on CXR  -AG met acidosis  -Tobacco dependence  -PCN allergy    Plan:  -IMC/tele  -adv to clears, cont gentle ivf for now--check serial lactate until normalizes  -serial cbc q8h--transfuse to keep hgb >8 and plts >50  -transfuse 1u plts and 2u ffp now  -monitor coags closely  -neurovascular checks LLE  -decide on ortho c/s and/or further reimaging L hip/thigh  -ppi iv bid for now  -ativan per ciwa scale  -mvi/thiamine/folate  -f/u GI recs--also will discuss if feels should proceed w HIDA  -lactulose prn  -check d bili/coags/ammonia/lactate; f/u cmp  -empiric rocephin  -check full tfts  -procedure team for diag/therapeutic paracentesis in AM (once plts>50 and INR<2.0) and if confirm sufficient ascites to tap  -review ct and w/u done at osh; decide need for addl imaging   -if never had Left inguinal hernia repair -will determine need for surg eval here if seems acutely symptomatic  -check pa/lat cxr soon, check sat RA, ISS  -careful pain control/antiemetics prn, supportive  care  -PT/OT/SW/CM     FOLEY: none  DVT prophylaxis: scds alone for now    Disposition:   Today's date: 02/23/2015  Admit Date: 02/22/2015 3:12 PM  Anticipated medical stability for discharge:Red - not tomorrow - estimated month/date: 6/9  Service status: INPT  Reason for ongoing hospitalization: decompensated cirrhosis, etoh wd  Anticipated discharge needs: tbd, needs pmd, tcm appt    Gregory Nucci Maretta Bees, MD

## 2015-02-23 NOTE — Progress Notes (Signed)
Air cabin crew note:  Patient was transported to ST Ultrasound from [place] [facility] on monitor by Air cabin crew. Monitored rhythm during transport and test, pt tolerated test without distress, vitals remained stable no changes in status noted. Pt returned to room, assisted back to hospital bed. Hand off report given to bedside RN. Patient stable and safety maintained.

## 2015-02-23 NOTE — Student Progress (Signed)
MEDICINE STUDENT PROGRESS NOTE    Date Time: 02/23/2015 6:32 AM  Patient Name: Gregory [name]  Attending Physician: Nilsa Nutting Ashiq, *    Subjective   CC: liver failure    HPI: Gregory [name] is a 39 year old male with a PMH of alcohol abuse who presents with jaundice, left flank pain, emesis, and AMS in the setting of liver failure who was transferred from [facility] ED. He had an CT scan done at the outside hospital which was reviewed with one of our radiologists here. The CT noted a distended gallbladder, large left hip intramuscular hematoma, large ascites, left-sided pleural effusion, no free air in the abdomen but evidence of right colonic wall thickening concerning for colitis. Patient also reports left hip pain secondary to a mechanical fall while camping last weekend. In the ER, bilirubin was elevated to 7, suggestive of worsening liver failure and hemoglobin was decreased to 6.5, for which he received a blood transfusion. In the ED, lactic acid was elevated to 4.7, for which he received IV fluids.     PSH: appendectomy, knee surgery    PMH: Cirrhosis, thyroid disease    SH: Quit smoking 4 weeks ago, smoked 0.5 ppd. Reports drinking 6 pack beer/day. Denies illicit drugs.    FH: Mother, father, 4 grandparents with diabetes    Home meds: lorazepam, oxycodone    Allergies: penicillins    Subjective: Patient reports his night was "horrible" because of his left thigh pain. Patient received 11 mg ativan overnight. Patient has persistently been tachycardic, 101-143, mostly low 100s.     Review of Systems:   Denies n/v/sob  Report left thigh pain    Physical Exam:     VITAL SIGNS PHYSICAL EXAM   Temp:  [97 F (36.1 C)-98 F (36.7 C)] 98 F (36.7 C)  Heart Rate:  [104-143] 104  Resp Rate:  [7-20] 20  BP: (104-154)/(69-95) 130/83 mmHg  Blood Glucose: 96    Telemetry:       Intake/Output Summary (Last 24 hours) at 02/23/15 1610  Last data filed at 02/23/15 0600   Gross per 24 hour   Intake  802.5 ml   Output     750 ml   Net   52.5 ml    General: mild distress 2/2 left thigh pain  Neuro: A+Ox3, follows commands  Lungs: CTAB, good air movement  Cardiovascular: Tachycardic, normal S1/S2 without murmurs, rubs, or gallops  Abdomen: Soft, non-tender, non-distended  Extremities: No asterixis, strength 5/5 BLE, left thigh hematoma  Skin: Jaundice       Lines (list foley, central lines, picc lines, and date on insertion):   PIV x2, placed 6/5    Meds (essential medications - list Abx days, group by class):     Scheduled Meds:  Current Facility-Administered Medications   Medication Dose Route Frequency   . cefTRIAXone  1 g Intravenous Q24H SCH   . folic acid  1 mg Oral Daily   . magnesium sulfate  1 g Intravenous Once   . multivitamin  1 tablet Oral Daily   . pantoprazole  40 mg Intravenous BID   . thiamine  100 mg Oral Daily     Continuous Infusions:  . sodium chloride       PRN Meds:.sodium chloride, sodium chloride, LORazepam, naloxone, ondansetron **OR** ondansetron      Labs:     Labs (last 72 hours): (ensure neat format, include any other labs not listed below)  Recent Labs  Lab 02/23/15  0024 02/22/15  1931   WBC 6.94 9.35   HGB 6.5* 7.4*   HEMATOCRIT 18.0* 20.6*   PLATELETS 47* 58*         Recent Labs  Lab 02/22/15  1836 02/17/15  1942   PT 24.8* 19.7*   PT INR 2.3* 1.8*   PTT 51*  --       Recent Labs  Lab 02/23/15  0039 02/22/15  1536   SODIUM 135* 135*   POTASSIUM 4.4 4.4   CHLORIDE 107 104   CO2 19* 17*   BUN 11.0 9.0   CREATININE 1.4* 1.4*   CALCIUM 7.1* 7.8*   ALBUMIN 2.0* 2.2*   PROTEIN, TOTAL 6.8 7.4   BILIRUBIN, TOTAL 7.5* 7.7*   ALKALINE PHOSPHATASE 130* 142*   ALT 29 33   AST (SGOT) 216* 248*   GLUCOSE 96 121*                  Microbiology (update daily)  None    Imaging (summarize)  CT (6/5): The CT noted a distended gallbladder, large left hip intramuscular hematoma, large ascites, left-sided pleural effusion, no free air in the abdomen but evidence of right colonic wall thickening concerning for  colitis.   CXR (6/5): 1. Left basilar airspace disease and right basilar atelectasis. 2. Left pleural fluid/thickening.    Assessment (brief and succinct one liner with diagnoses, not symptoms):     Gregory [name] is a 39 year old male with PMH of alcohol abuse and alcoholic cirrhosis who presents with AMS, nausea, vomiting, anemia, and concern for cholecystitis on imaging.     Plan (by active issues, prioritized.  Can just list the inactive issues):   # Liver failure likely secondary to alcoholic cirrhosis  - AST and AST elevated in alcoholic pattern (AST: 216, ALT: 29)  - Ammonia 37-->31 (normal range)  - Did not receive lactulose because patient could not take PO medication  - INR elevated to 2.3   - Evidence of large ascites on outside CT, will order paracentesis. (No report of fever so was concerning for SBP; low threshold to start ceftriaxone if he spikes a fever)  - Meld score 27, 19.6% estimated 35-month mortality    # Anemia, likely acute blood loss  - Recent fall with large left sided hip hematoma - could be the source of anemia but cannot rule out GI etiology  - MCV 92.8, not suggestive of megaloblastic anemia  - No evidence of hematemesis and no report of hematemesis. No documented history of varices available  - Hgb 6.5 on arrival, increased to 7.4 after 1 unit pRBCs, decreased to 6.5 overnight, patient is now s/p 2 units pRBCs  - Every 8 hour CBCs, transfuse as needed  - Neurovascular checks every 4 hours  - Protonix IV twice a day  - NPO  - Consult GI    # Metabolic acidosis with elevated lactate  - Expect some level of lactic acidosis given liver failure  - Lactic acid is trending down with IV fluids 4.7 --> 3.0 --> 2.3  - Continue to re-check q 6 hours    # Active alcohol abuse  - Received 1 banana bag  - Continue daily multivitamin, folic acid, and thiamine  - Continue CIWA protocol and provide ativan as needed    # AKI, 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 EtOH withdrawal    # Thrombocytopenia likely secondary to cirrhosis  - Platelets trending down: 58 --> 47  - Differential: secondary to cirrhosis vs. dillutional  - Continue CBCs q 8 hrs    # Nausea and vomiting, could be due to colitis found on CT scan vs. Cholecystitis   - Patient obtained RUQ [place], follow up final read  - Protonix as above  - Zofran as needed  - Follow up GI recs    # Hyperbilirubinemia likely secondary to alcoholic cirrhosis  - Distended gallbladder on outside CT scan, as well as documented recent historical concern for cholecystitis  - Follow up right upper quadrant ultrasound to further evaluate    # Tremor  - Differential diagnosis: asterixis due to hepatic encephalopathy vs. Acute EtOH withdrawal  - No asterixis on exam, likely due to EtOH withdrawal    # Left thigh pain  - S/p mechanical fall last weekend  - Large left thigh hematoma possibly cause of anemia  - Continue oxycodone PRN    # Tachycardic  - Likely 2/2 pain    DVT prophylaxis: SCDs  FEN/GI: NPO, replete electrolytes as needed  Code status: full code    Disposition (what needs to be accomplished before the patient returns home):   Anemia stabilized  Resolved metabolic acidosis    Signed by: Dewitt Rota, BS

## 2015-02-23 NOTE — Progress Notes (Signed)
A/O x4 but needs reorientation to place, BUE tremors, FC, MAE, c/o of back and L hip pain. Afebrile, ST 100-110s, SBP 100-104. NC 2L, no desats 98-100%. AUOP via urinal, no BM this shift. H/H 6.5, 18.0, MD notified. 1 Unit of RBCs tranfused. Scoring on CIWA, 2mg  Ativan given x2, 4mg  Ativan given x1 with good effect. Will continue to monitor.

## 2015-02-23 NOTE — Consults (Signed)
[facility]  CONSULTATION NOTE  [facility] call: [phone], [phone]  [facility] call: [phone]  After hours call [phone]        Date Time: 02/23/2015 6:11 PM  Patient Name: Gregory [name]  Requesting Physician: Nilsa Nutting Ashiq, *       Reason for Consultation:   Anemia, Cirrhosis    Assessment and Plan:   Assessment:  1.  Anemia in setting of large left hip hematoma, likely main source.  No report of overt GI bleeding at this time  2.  Abnormal LFTs, suspect alcoholic hepatitis and underlying known cirrhosis.  Need to r/o viral hepatitis, autoimmune hepatitis, Wilson's, hemochromatosis.  DF is 52.  MELD 26.7  3.  Alcohol abuse    Plan:  1.  Follow H&H and continue to monitor for overt GI bleeding.  Transfuse PRBC as appropriate, but goal hct ~22 in setting of cirrhosis and evidence of varices on imaging  2.  PPI  3.  If patient with tappable ascites, recommend diagnostic paracentesis and check cell count, gram stain, culture, cytology, and albumin  4.  Follow LFTs/INR daily  5.  CIWA protocol/CATS  6.  Lactulose TID, titrate to 2-3 soft stools daily  7.  Xifaxan 550mg  BID  8. Check viral hepatitis serologies, ANA, AMA, ASMA, Ceruloplasmin, Iron profile, Ferritin, AFP  9. Encourage nutrition--calorie count.  If infectious work up is negative then may consider prednisolone.      History:   The patient is a 39 y/o male with history of alcohol abuse who presented to [facility] ED initially with abdominal pain.  He was found to have anemia and jaundice and was transferred here for further work up.  He had a CT in [place] that per Medicine showed distended gallbladder, large left hip hematoma, large ascites, left sided pleural effusion and right colonic wall thickening.  He had an [place] here that showed probable cirrhosis but only a small amount of fluid around the gallbladder.  Creatinine is 1.4, hct was 20, AST is elevated in 200s with normal ALT, TBili is 7.7, mostly direct, INR is 2.3.   Lactate was 4.7, now 2.3.    On prior labs it looks like his hgb was around 9 in late May, and was normal in February.  LFTs have been high going back to 2014, though bili was usually 2-3 rather than 7.      He is lethargic but states he is followed by Dr. Willeen Cass for GI.  He has denied any overt GI bleeding to multiple providers.  He fell down the stairs at a wedding yesterday.  He is on CIWA.    Past Medical History:     Past Medical History   Diagnosis Date   . Thyroid disease    . Cirrhosis        Past Surgical History:     Past Surgical History   Procedure Laterality Date   . Appendectomy     . Knee surgery       Hardware placement R knee       Family History:     Family History   Problem Relation Age of Onset   . Diabetes Mother    . Diabetes Father    . Diabetes Maternal Grandmother    . Diabetes Maternal Grandfather    . Diabetes Paternal Grandmother    . Diabetes Paternal Grandfather        Social History:     History  Social History   . Marital Status: Married     Spouse Name: N/A   . Number of Children: N/A   . Years of Education: N/A     Social History Main Topics   . Smoking status: Former Smoker -- 0.50 packs/day     Types: Cigarettes     Quit date: 01/22/2015   . Smokeless tobacco: Never Used   . Alcohol Use: 25.2 oz/week     42 Cans of beer per week      Comment: daily, 6 pack beer q day   . Drug Use: No   . Sexual Activity: Not on file     Other Topics Concern   . Not on file     Social History Narrative       Allergies:     Allergies   Allergen Reactions   . Penicillins Hives       Medications:     Current Facility-Administered Medications   Medication Dose Route Frequency   . cefTRIAXone  1 g Intravenous Q24H SCH   . folic acid  1 mg Oral Daily   . lactulose  20 g Oral 4 times per day   . multivitamin  1 tablet Oral Daily   . pantoprazole  40 mg Intravenous BID   . thiamine  100 mg Oral Daily       Review of Systems:   Complete 12 Point ROS negative or normal except those mentioned in HPI  above.    Physical Exam:     Filed Vitals:    02/23/15 1753   BP: 120/68   Pulse: 118   Temp: 98 F (36.7 C)   Resp: 11   SpO2: 96%       General appearance: Well developed, well nourished, appears stated age and in NAD  Eyes: Sclera anicteric, pink conjunctivae, no ptosis  ENMT: mucous membranes moist, nose and ears appear normal.  Oropharynx clear.  Chest: Non labored respirations, no audible wheezing, no clubbing or cyanosis  CV:  Regular rate and rhythm, no JVD, no LE edema  Abdomen: soft, non-tender, non-distended, no masses or organomegaly  Skin: Normal color and turgor, no rashes, no suspicious skin lesions noted  Neuro: CN II-XII grossly intact.  No gross movement disorders noted.  Mental status: Appropriate affect, alert and oriented x 3    Labs Reviewed:     Recent Labs      02/23/15   1535  02/23/15   0825   WBC  7.05  7.27   HGB  7.2*  7.4*   HEMATOCRIT  20.0*  21.0*   PLATELETS  39*  41*   MCV  92.6  91.7       Recent Labs      02/23/15   1535  02/23/15   0039  02/22/15   1536   SODIUM  136  135*  135*   POTASSIUM  3.7  4.4  4.4   CHLORIDE  107  107  104   CO2  20*  19*  17*   BUN  13.0  11.0  9.0   CREATININE  1.4*  1.4*  1.4*   GLUCOSE  95  96  121*   CALCIUM  7.7*  7.1*  7.8*   MAGNESIUM  1.8   --   1.6   PHOSPHORUS  3.8   --    --        Recent Labs      02/23/15  1535  02/23/15   0039  02/22/15   1536   AST (SGOT)  204*  216*  248*   ALT  29  29  33   ALKALINE PHOSPHATASE  121*  130*  142*   BILIRUBIN, TOTAL  8.5*  7.5*  7.7*   BILIRUBIN, DIRECT  5.0*   --   5.3*   PROTEIN, TOTAL  6.7  6.8  7.4   ALBUMIN  2.2*  2.0*  2.2*       Recent Labs      02/23/15   1535  02/22/15   1836   PTT   --   51*   PT  23.3*  24.8*   PT INR  2.1*  2.3*        Radiology:   Radiological Procedure reviewed:  [place] RUQ 02/23/15:   1. The liver demonstrates a coarsened echotexture with a mildly nodular contour. There is no intrahepatic biliary ductal dilatation or mass.  2. A moderate amount of mobile sludge is identified  within a distended gallbladder. There is no gallstone. The gallbladder wall is edematous and may be partially due to the surrounding ascites. If there is high clinical concern of acute cholecystitis a HIDA scan is recommended.  3. There is no hydronephrosis within the right kidney. There is a 1.0 cm benign appearing upper pole cyst.  4. Ascites. The remainder is as above.

## 2015-02-23 NOTE — Progress Notes (Signed)
This Clinical research associate had introduced self/role to the patient.  Patient appeared drowsy and had wanted to use urinal at time writer initiated assessment. Will f/u with initial assessment later.    Herma Carson, RN, [facility] Discharge Planner  [facility]  [phone]

## 2015-02-23 NOTE — PT Eval Note (Signed)
[facility]   Physical Therapy Evaluation     Patient: Gregory [name]    MRN#: [id number]   Unit: [facility] TOWER 4  Bed: V409/W119.14    Discharge Recommendations:   Discharge Recommendation: Home with home health PT or home w/ supervision (TBA further)      DME Recommendation: pt has been using SPC since injury; will assess for increased level of assistive device (RW) when pt more alert, able to participate and mobilize    If Home with home health PT recommended discharge disposition is not available, patient will need 24 hour  assist for mobility, transfers; may need RW for gait.        Assessment:   Gregory [name] is a 39 y.o. male admitted 02/22/2015 with c/o left hip/back pain after falling > 1 week ago.  Pt does not offer any other c/o but presents w/ lethargy (easily falling asleep and minimal following of commands) and decreased functional mobility.  pt has been diagnosed with anemia likely d/t hematoma after fall, acute ETOH withdrawal, lethargy, RLL atelectasis, obstructive jaundice. He will benefit from continued PT to address these deficits and improve his level of independence w/ daily tasks.    Therapy Diagnosis: decreased functional mobility, pain left hip/low back    Rehabilitation Potential: fair    Treatment Activities: Evaluation; limited due to lethargy  Educated the patient to role of physical therapy, plan of care, goals of therapy and safety w/ mobility.    Plan:   PT Frequency: 2-3x/wk    Treatment/Interventions: functional mobility skills including bed mobility, transfers, gait; balance training; gentle stretches for low back, hip as tolerated/appropriate    Risks/benefits/POC discussed w/ patient       Precautions and Contraindications:   lethargy    Consult received for Linus Galas for PT Evaluation and Treatment.  Patient's medical condition is appropriate for Physical Therapy intervention at this time.    History of Present Illness:   Gregory [name] is a 39 y.o. male admitted on 02/22/2015 with depressed mental status and found to have anemia concerning for acute blood loss, as well as ascites and biliary sludge. Mental status slowly improving, but still with significant anemia requiring blood product transfusion. Pt also w/ active ETOH abuse, metabolic acidosis, AKI, thrombocytopenia, distended gallbladder.  He was transferred to [facility] for management of these issues.     Medical Diagnosis: Hyperbilirubinemia [E80.6]  Tachycardia [R00.0]  Alcohol withdrawal, with unspecified complication [F10.239]  Altered mental status, unspecified altered mental status type [R41.82]  Liver failure without hepatic coma, unspecified chronicity [K72.90]    Past Medical/Surgical History:  thyroid disease  cirrhosis    Imaging/Tests/Labs:    US Abdomen Limited Ruq    02/23/15  IMPRESSION:   1. The liver demonstrates a coarsened echotexture with a mildly nodular  contour. There is no intrahepatic biliary ductal dilatation or mass.  2. A moderate amount of mobile sludge is identified within a distended  gallbladder. There is no gallstone. The gallbladder wall is edematous  and may be partially due to the surrounding ascites. If there is high  clinical concern of acute cholecystitis a HIDA scan is recommended.  3. There is no hydronephrosis within the right kidney. There is a 1.0 cm  benign appearing upper pole cyst.  4. Ascites.     XR Chest AP Portable   02/22/15  Impression:  1. Left basilar airspace disease and right basilar atelectasis.  2. Left pleural  fluid/thickening    XR Lumbar Spine AP And Lateral    02/17/15  IMPRESSION: No acute osseous abnormality of the lumbar spine.    Social History:   Prior Level of Function: independent w/ mobility until this past week (due to fall, pain)  Assistive Devices: has been using cane x1 week, otherwise independent prior to that  Baseline Activity: works as Nutritional therapist  DME Currently at Home: [facility]  Home Living Arrangements: lives w/  s.o.  Type of Home: 2-level? pt inconsistent w/ responses    Subjective:   Patient is agreeable to participation in the therapy session. Nursing clears patient for therapy.     Patient Goal: reduce pain    Pain: pt asked multiple times about pain; able to point to location but did not rate pain despite questioning.  Pt often falling asleep mid-sentence  Scale: unable to assess  Location: left low back/hip  Intervention: repositioning; per RN, pt w/ recent medication for pain    Objective:   Patient is in bed with telemetry in place.  HR noted to be > 120s; RN informed who cleared pt for PT.  HR varied b/w 115-128  No SCDs noted on patient.  Pt sleeping/lethargic, slow to respond.  Gown noted to be wet and at one point pt removed clothing.    Cognitive Status and Neuro Exam:  As noted, lethargic.  Slow to respond and needs repetition at times.  A&O to place, states in hospital for pain. Does not follow commands consistently nor respond to questions consistently.    Musculoskeletal Examination: Pt observed to be moving all 4's at some point during the assessment.  Gross strength is at least 3+/5 for UEs/LEs.  However, on command, pt does not move as much and effort is slowed.      ROM:  appears w/o restriction or limitation of all 4's.  Neck ROM appears WFL.  Unable to fully assess trunk ROM.    Functional Mobility  Rolling: use of rails; anywhere from CGA to mod A  Supine to Sit: min A +/-  Scooting: mod-min A  Sit to Stand: deferred d/t lethargy and tachycardia  Stand to Sit: deferred  Transfers: deferred    Ambulation: unable to assess today d/t lethargy, limited command following, and c/o pain    Balance: limited assessment d/t lethargy; sitting balance (static) appears WFL but pt holding onto rail and would not let go on command; able to use UEs to scoot in bed and move around w/o noted LOB    Participation and Activity Tolerance  Participation Effort: limited; lethargic  Endurance: limited; pain and  lethargy    Patient left with call bell within reach, all needs met, SCDs no one as none present, fall mat in place, bed alarm on, chair alarm n/a and all questions answered. RN notified of session outcome and patient response.     Goals:  Goals  Goal Formulation: With patient  Goals: Select goal  Pt Will Go Supine To Sit: modified independent, to maximize functional mobility and independence  Pt Will Perform Sit to Stand: with supervision, to maximize functional mobility and independence  Pt Will Transfer Bed/Chair: with supervision, to maximize functional mobility and independence  Pt Will Ambulate: with single point cane, with stand by assist, to maximize functional mobility and independence, 101-150 feet (or least restrictive assistive device)    Time of Treatment  PT Received On: 02/23/15  Start Time: 1000  Stop Time: 1045  Time Calculation (min): 45 min  Jennell Corner, PT  License #[id number]  [facility]  Department of Rehabilitation

## 2015-02-23 NOTE — Plan of Care (Addendum)
Problem: Pain  Goal: Patient's pain/discomfort is manageable  Outcome: Progressing  A&Ox4 (disoriented to time at times), drowsy, MAE but weak. CIWA score 5-6. C/o left hip pain & medicated with Oxycodone x1 w/ good relief. VSS. Afebrile. NC 2L w/ no desat. No BM. Tolerated clear liquid diet. Voided per urinal - tea colored. Received 2 units of plasma, Mag Rider x1. Currently pt is receiving 1 unit of platelets, MD resident made aware of transfusing platelets first, followed by 1 unit of PRBC once platelet is done. [place] of abd.done. Continue to monitor CBC q8h, lactic q6h.

## 2015-02-24 ENCOUNTER — Inpatient Hospital Stay: Payer: Charity

## 2015-02-24 ENCOUNTER — Encounter: Payer: Self-pay | Admitting: Orthopaedic Surgery

## 2015-02-24 DIAGNOSIS — S8012XA Contusion of left lower leg, initial encounter: Secondary | ICD-10-CM

## 2015-02-24 LAB — PT/INR
PT INR: 2 — ABNORMAL HIGH (ref 0.9–1.1)
PT: 22.1 s — ABNORMAL HIGH (ref 12.6–15.0)

## 2015-02-24 LAB — PREPARE FRESH FROZEN PLASMA
Expiration Date: 201606120810
Expiration Date: 201606121317
Status: TRANSFUSED
Status: TRANSFUSED
UTYPE: A POS
UTYPE: O POS

## 2015-02-24 LAB — CBC
Hematocrit: 20.8 % — ABNORMAL LOW (ref 42.0–52.0)
Hematocrit: 23.4 % — ABNORMAL LOW (ref 42.0–52.0)
Hematocrit: 21.6 % — ABNORMAL LOW (ref 42.0–52.0)
Hgb: 7.5 g/dL — ABNORMAL LOW (ref 13.0–17.0)
Hgb: 8.3 g/dL — ABNORMAL LOW (ref 13.0–17.0)
Hgb: 7.9 g/dL — ABNORMAL LOW (ref 13.0–17.0)
MCH: 33 pg — ABNORMAL HIGH (ref 28.0–32.0)
MCH: 31.9 pg (ref 28.0–32.0)
MCH: 32.9 pg — ABNORMAL HIGH (ref 28.0–32.0)
MCHC: 36.1 g/dL — ABNORMAL HIGH (ref 32.0–36.0)
MCHC: 35.5 g/dL (ref 32.0–36.0)
MCHC: 36.6 g/dL — ABNORMAL HIGH (ref 32.0–36.0)
MCV: 91.6 fL (ref 80.0–100.0)
MCV: 90 fL (ref 80.0–100.0)
MCV: 90 fL (ref 80.0–100.0)
MPV: 10 fL (ref 9.4–12.3)
MPV: 10.4 fL (ref 9.4–12.3)
MPV: 10.5 fL (ref 9.4–12.3)
Nucleated RBC: 0 /100 WBC (ref 0–1)
Nucleated RBC: 0 /100 WBC (ref 0–1)
Nucleated RBC: 1 /100 WBC (ref 0–1)
Platelets: 76 10*3/uL — ABNORMAL LOW (ref 140–400)
Platelets: 65 10*3/uL — ABNORMAL LOW (ref 140–400)
Platelets: 62 10*3/uL — ABNORMAL LOW (ref 140–400)
RBC: 2.27 10*6/uL — ABNORMAL LOW (ref 4.70–6.00)
RBC: 2.6 10*6/uL — ABNORMAL LOW (ref 4.70–6.00)
RBC: 2.4 10*6/uL — ABNORMAL LOW (ref 4.70–6.00)
RDW: 21 % — ABNORMAL HIGH (ref 12–15)
RDW: 21 % — ABNORMAL HIGH (ref 12–15)
RDW: 22 % — ABNORMAL HIGH (ref 12–15)
WBC: 5.99 10*3/uL (ref 3.50–10.80)
WBC: 6.14 10*3/uL (ref 3.50–10.80)
WBC: 7.56 10*3/uL (ref 3.50–10.80)

## 2015-02-24 LAB — HEPATIC FUNCTION PANEL
ALT: 28 U/L (ref 0–55)
AST (SGOT): 178 U/L — ABNORMAL HIGH (ref 5–34)
Albumin/Globulin Ratio: 0.5 — ABNORMAL LOW (ref 0.9–2.2)
Albumin: 2.2 g/dL — ABNORMAL LOW (ref 3.5–5.0)
Alkaline Phosphatase: 117 U/L — ABNORMAL HIGH (ref 38–106)
Bilirubin Direct: 5 mg/dL — ABNORMAL HIGH (ref 0.0–0.5)
Bilirubin Indirect: 4.2 mg/dL — ABNORMAL HIGH (ref 0.0–1.1)
Bilirubin, Total: 9.2 mg/dL — ABNORMAL HIGH (ref 0.2–1.2)
Globulin: 4.4 g/dL — ABNORMAL HIGH (ref 2.0–3.6)
Protein, Total: 6.6 g/dL (ref 6.0–8.3)

## 2015-02-24 LAB — FERRITIN
Ferritin: 1988.72 ng/mL — ABNORMAL HIGH (ref 21.80–274.70)
Ferritin: 1973.79 ng/mL — ABNORMAL HIGH (ref 21.80–274.70)

## 2015-02-24 LAB — PREPARE RBC
Expiration Date: 201606252359
Status: TRANSFUSED
UTYPE: O POS

## 2015-02-24 LAB — HEPATITIS B SURFACE ANTIGEN W/ REFLEX TO CONFIRMATION: Hepatitis B Surface Antigen: NONREACTIVE

## 2015-02-24 LAB — CBC PATHOLOGIST REVIEW

## 2015-02-24 LAB — IRON PROFILE
Iron: 121 ug/dL (ref 40–160)
UIBC: <17 ug/dL — ABNORMAL LOW (ref 126–382)

## 2015-02-24 LAB — CERULOPLASMIN: Ceruloplasmin: 15 mg/dL — ABNORMAL LOW (ref 20–60)

## 2015-02-24 LAB — HEPATITIS A ANTIBODY, IGM: Hep A IgM: NONREACTIVE

## 2015-02-24 LAB — BASIC METABOLIC PANEL
BUN: 13 mg/dL (ref 9.0–28.0)
CO2: 20 mEq/L — ABNORMAL LOW (ref 22–29)
Calcium: 7.8 mg/dL — ABNORMAL LOW (ref 8.5–10.5)
Chloride: 107 mEq/L (ref 100–111)
Creatinine: 1.2 mg/dL (ref 0.7–1.3)
Glucose: 90 mg/dL (ref 70–100)
Potassium: 3.5 mEq/L (ref 3.5–5.1)
Sodium: 135 mEq/L — ABNORMAL LOW (ref 136–145)

## 2015-02-24 LAB — HEPATITIS B SURFACE ANTIBODY: HEPATITIS B SURFACE ANTIBODY: 11.58

## 2015-02-24 LAB — HAPTOGLOBIN: Haptoglobin: <8 mg/dL — ABNORMAL LOW (ref 14–258)

## 2015-02-24 LAB — AFP TUMOR MARKER: Alpha-Fetoprotein: 7.1 ng/mL (ref 0.0–8.1)

## 2015-02-24 LAB — HEPATITIS A ANTIBODY, TOTAL: Hepatitis A Total Antibody: REACTIVE — AB

## 2015-02-24 LAB — HEPATITIS C ANTIBODY: Hepatitis C, AB: NONREACTIVE

## 2015-02-24 LAB — HEMOLYSIS INDEX
Hemolysis Index: 16 (ref 0–18)
Hemolysis Index: 15 (ref 0–18)

## 2015-02-24 LAB — LACTATE DEHYDROGENASE: LDH: 225 U/L (ref 125–331)

## 2015-02-24 LAB — HEPATITIS B CORE ANTIBODY, IGM: Hepatitis B Core IgM: NONREACTIVE

## 2015-02-24 LAB — ANA SCREEN ONLY: ANA Screen: NEGATIVE

## 2015-02-24 LAB — GFR: EGFR: >60

## 2015-02-24 MED ORDER — LACTULOSE 10 GM/15ML PO SOLN
30.0000 g | ORAL | Status: DC
Start: 2015-02-24 — End: 2015-02-25
  Administered 2015-02-24: 30 g via ORAL
  Filled 2015-02-24: qty 60

## 2015-02-24 MED ORDER — SODIUM CHLORIDE 0.9 % IV SOLN
INTRAVENOUS | Status: DC | PRN
Start: 2015-02-24 — End: 2015-02-25

## 2015-02-24 MED ORDER — HALOPERIDOL LACTATE 5 MG/ML IJ SOLN
0.5000 mg | Freq: Three times a day (TID) | INTRAMUSCULAR | Status: DC | PRN
Start: 2015-02-24 — End: 2015-02-25
  Administered 2015-02-25: 0.5 mg via INTRAVENOUS
  Filled 2015-02-24: qty 1

## 2015-02-24 MED ORDER — PHYTONADIONE 5 MG PO TABS
5.0000 mg | ORAL_TABLET | Freq: Once | ORAL | Status: AC
Start: 2015-02-24 — End: 2015-02-24
  Administered 2015-02-24: 5 mg via ORAL
  Filled 2015-02-24 (×3): qty 1

## 2015-02-24 MED ORDER — HALOPERIDOL LACTATE 5 MG/ML IJ SOLN
0.5000 mg | Freq: Once | INTRAMUSCULAR | Status: DC | PRN
Start: 2015-02-24 — End: 2015-02-25

## 2015-02-24 MED ORDER — FUROSEMIDE 10 MG/ML IJ SOLN
20.0000 mg | Freq: Once | INTRAMUSCULAR | Status: AC
Start: 2015-02-24 — End: 2015-02-24
  Administered 2015-02-24: 20 mg via INTRAVENOUS
  Filled 2015-02-24: qty 4

## 2015-02-24 MED ORDER — SODIUM CHLORIDE 0.9 % IV SOLN
INTRAVENOUS | Status: DC | PRN
Start: 2015-02-24 — End: 2015-02-27

## 2015-02-24 MED ORDER — LACTULOSE 10 GM/15ML PO SOLN
30.0000 g | Freq: Four times a day (QID) | ORAL | Status: DC
Start: 2015-02-24 — End: 2015-02-24
  Administered 2015-02-24: 30 g via ORAL
  Filled 2015-02-24: qty 60

## 2015-02-24 MED ORDER — GABAPENTIN 300 MG PO CAPS
300.0000 mg | ORAL_CAPSULE | Freq: Three times a day (TID) | ORAL | Status: DC
Start: 2015-02-24 — End: 2015-02-25
  Administered 2015-02-25: 300 mg via ORAL
  Filled 2015-02-24: qty 1

## 2015-02-24 NOTE — Progress Notes (Signed)
IM Attending Addendum:  I have seen and examined patient personally. I have discussed with housestaff, and reviewed and agree with findings and plan as per Dr. 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 (seen on CT from OSH)  -Anemia-h/h 6.5/18.0--likely acute blood loss d/t L thigh hematoma after fall--now s/p multiple prbc (5u here) w/o appropriate h/h improvement  -Thrombocytopenia-s/p 1u plts  -SIRS (POA)/lactic acidosis-afebrile/no leukocytosis  -Acute etoh withdrawal-tremor (vs asterixis from liver failure/HE)  -Acute metabolic encephalopathy-likely multifactorial  -AKI  -Elevated TSH  -Recent fall with L pelvic/hip pain--has L thigh hematoma, no frx on xray at osh  -LLL ASD and RLL atelectasis + L pleural fluid/thickening on CXR  -AG met acidosis  -Tobacco dependence  -PCN allergy    Plan:  -IMC/tele  -adv back to clears, cont gentle ivf for now--check serial lactate until normalizes  -serial cbc q8h/coags   --transfuse to keep hgb >8 and plts >50, INR ~1.7--1u prbc and 2u ffp now  -neurovascular checks LLE  -ortho c/s + decide timing further reimaging L hip/thigh  -ppi iv bid for now  -ativan per ciwa scale  -mvi/thiamine/folate  -f/u GI recs--possible EGD esp if h/h cont to drop  -hold on hida for now as no clear clinical signs cholecystitis  -lactulose   -empiric rocephin-stop soon as no evid gib, no clear need sbp proph  -procedure team for diag/therapeutic paracentesis in AM (once plts>50 and INR<2.0) and if confirm sufficient ascites to tap  -eventual Left inguinal hernia repair  -recheck port cxr, check sat RA, ISS  -careful pain control/antiemetics prn, supportive care  -PT/OT/SW/CM     FOLEY: none  DVT prophylaxis: scds alone for now    Disposition:   Today's date: 02/24/2015  Admit Date: 02/22/2015 3:12 PM  Anticipated medical stability for discharge:Red - not tomorrow - estimated month/date: 6/10  Service status:  INPT  Reason for ongoing hospitalization: decompensated cirrhosis, etoh wd  Anticipated discharge needs: tbd, needs pmd, tcm appt    Gregory Zeringue Maretta Bees, MD

## 2015-02-24 NOTE — Progress Notes (Addendum)
Case Management Initial Discharge Planning Assessment  This writer introduced self/role and provided phone number for the patient. Writer verified patient's demographics. Pt reports feeling safe and that all needs are met at home.  Pt states mother/friends will be able to provide transportation for the patient.  Will continue to monitor the pt for discharge planning.     Patient is a Medicare focused diagnosis patient? Yes/No No   Patient is a Cardiac Bundle patient? Yes/No No   Patient is a 30 day INPATIENT readmission (current inpatient admission and another inpatient admission within 30 days)? Yes/No No      EPIC Documentation  CM Assessment complete button selected in CM Navigator? Yes/No Yes   Readmissions flowsheet completed in CM Navigator if patient is readmission? Yes/No N/a       Psychosocial/Demographic Information   Name of interviewee/s: Dominique Calvey   Orientation and decision making abilities of patient (ie a&ox3 able to make decisions, demented patient, patient on vent, etc)   A&Ox4 (disoriented to time at times) per RN note   Does the patient have an Advance Directive? Location? (home/on chart, if home-advised to bring in copy?) <no information>  Advance Directive: Patient does not have advance directive - declined need for booklet   Healthcare Decision Maker (HDM) (if other than the patient) Include relationship and contact information.  Self   Any additional emergency contacts? Extended Emergency Contact Information  Primary Emergency Contact: McNeil,Kayla  Address: [phone]           [address]           [place], [place] [address] [place]  Home Phone: [phone]  Mobile Phone: [phone]  Relation: Other   Pt lives with:  Living Arrangements: Friends   Type of residence where patient lives:    ][facility] - patient lives on couch in friend's home   ]   Prior level of functioning (ambulation & ADL's)   ]independent with ADL's and ambulation   Support system-list  (i.e.church, friends,  extended family, friends?) Mother, friends   Do you want to designate an individual who will care for or assist you upon discharge? Yes, friend Dorathy Daft)   If yes: Please list the name, relationship, phone number, and address of the designated individual. Info listed above for [facility]       Correct Insurance listed on face sheet - verified with the patient/HDM Pending charity      Discharge Planning Services in Place  Name of Primary Care Physician verified in patient banner (update in patient banner if not listed) [facility]   [address] [place], [place] [address]  [phone]   What DME does the patient currently own? (rolling walker, hospital bed, home O2, BiPAP/CPAP, bedside commode, cane, hoyer lift)  ]eyeglasses   ]   ]   Are PT/OT services indicated? If so, has it been ordered?  Yes   Has the patient been to an Acute Rehab or SNF in the past?  If so, where?   No   Does the patient currently have home health or hospice/palliative services in place?  If so, list agency name.   No   Does the patient already have community dialysis set up?  If so, where? No      Readmission Assessment  What is the current LACE score? 14   Is this patient an inpatient to inpatient 30 day readmission? no   Previous admission discharge diagnosis n/a  Was patient readmitted from a facility? n/a   Patient active with Home Health? n/a   Patient active with Home Hospice? n/a   Contributing factors to readmission (i.e., no follow up appt on previous d/c, unable to get meds, no insurance, no social support, etc.)    Did patient/family understand what medication was for and how to administer, symptoms to indicate worsening condition, activity and diet restrictions at time of previous d/c? n/a      Anticipated Discharge Plan    Anticipated Disposition: Option A Home with HHPT/OT   Anticipated Disposition: Option B     Who will transport the patient upon discharge? Mother   If applicable, were SNF or Hospice choices provided?  N/a   Palliative Care Consult needed? (if yes, contact attending MD) no      Medicare/Medicare HMO Patients Only  If this patient is inpatient, was an initial IMM signed within 24 hours of admission? (Look in media tab, documents table or shadow chart) n/a   Is this patient identified as a Medicare focused diagnosis or readmission? Yes/No n/a     Herma Carson, RN, [facility] Discharge Planner  [facility]  [phone]

## 2015-02-24 NOTE — Progress Note - Problem Oriented Charting Notewrit (Signed)
MEDICINE PROGRESS NOTE    Date Time: 02/24/2015 6:06 AM  Patient Name: Gregory [name]  Attending Physician: Kathaleen Grinder, *   Medicine Resident Team A  Spectra: [phone]  Night: [phone]    Attention CONSULTANTS:   This patient is covered by the internal medicine teaching service.   Except in cases of emergency, please contact the medicine intern   or resident first for any questions or to place orders.      Assessment/Plan:   39yo gentleman with history of alcoholic cirrhosis, who was transferred from [facility] ED on 6/5 with depressed mental status and found to have anemia concerning for acute blood loss, as well as ascites and biliary sludge.  Persistent anemia, thrombocytopenia, INR elevation, requiring blood product transfusion, and now with enlarging hematoma and gum bleeding.        #Anemia  Recent fall with left sided hip trauma with L thigh hematoma noted on OSH CT likely source of anemia, and hematoma enlarging on exam.  Cannot rule out GI bleed as pt reports taking ibuprofen for thigh pain at home, but no known history of hematemesis/GI bleed.   - Ortho consulted - f/u recs  - 2U FFP, vitamin K IV 5mg  to reverse INR 2 this morning   - GI following - f/u recs  - CBC Q8h, transfuse for Hb<8  - Protonix IV 40mg  BID  - GI not planning on EGD right now, but may need to reconsider given new gum bleeding   - Check haptoglobin, LDH, ferritin    #LFT elevation and hepatic cirrhosis, likely from EtOH use, with large ascites noted on outside CT  MELD score 26.7.  HepBsAg, HepC Ab neg. Ceruloplasmin 15 (slightly low) - ?[place].  - Diagnostic and therapeutic paracentesis ordered, although may not have sufficient ascites to tap per RUQ [place] and will need further reversal of INR before able to proceed  - Ceftriaxone 1g Q24h x5 days for SBP prophylaxis  - Lactulose TID, titrate to 2-3 soft stools daily  - Rifaximin 550mg  BID  - f/u ANA, AMA, ASMA, AFP  - Touch base with GI regarding significance of low  ceruloplasmin, and if further work up (e.g. Urine Cu) would be indicated    #L thigh hematoma  Recent fall with left sided hip trauma and L thigh hematoma noted on OSH CT  - Neurovascular checks of LLE  - If persistent thigh pain, consider imaging and ortho consult  - Low dose tylenol and tramadol for pain     #Active alcohol abuse  - CIWA protocol  - PO multivitamin, folic acid, and thiamine  - CATS consult    #Altered mental status  Likely from dilaudid and ativan given outside hospital but can rule out hepatic encephalopathy.    - Lactulose and rifaximin as above.  Avoid sedating meds as much as possible.     #Sinus tachycardia   Likely multi-factorial, from pain, anxiety, EtOH withdrawal.    - Continue CIWA and      #Thrombocytopenia likely secondary to cirrhosis  - Monitor with daily CBC.  FFP as above.     #Distended gallbladder on outside CT scan  6/6 RUQ US showed mobile sludge seen within a distended gallbladder and edematous gallbladder wall is edematous (which may be partially due to the surrounding ascites), no gallstone.    - Per GI, exam not concerning, hold off on HIDA scan for now    FEN: NPO for possible procedure  Ppx: SCD for DVT  Code: FULL       Safety Checklist:     DVT prophylaxis:  CHEST guideline (See page e199S) Mechanical   Foley:  [place] Rn Foley protocol Not present   IVs:  Peripheral IV   PT/OT: Ordered   Daily CBC & or Chem ordered:  SHM/ABIM guidelines (see #5) Yes, due to clinical and lab instability   Reference for approximate charges of common labs: CBC auto diff - $76  BMP - $99  Mg - $79    Disposition:     Anticipated discharge needs: TBD    Subjective     CC: anemia     24 hour events: Transfused total 2U pRBC, 1U platelet, 4U FFP during the day.  Drowsy all day, although intermittently awake enough to complaint of L thigh pain. Overnight, received 1 more unit of pRBC for Hb 7.5. Persistently tachycardic 110s-140s overnight. Had one bowel movement overnight.     Subjective:  Reports gum bleeding, blood tinged urine, and severe L thigh and back pain.  No chest pain, nausea, hematemesis.  Still drowsy and unable to obtain full ROS.        Review of Systems:     As above    Physical Exam:     VITAL SIGNS PHYSICAL EXAM   Temp:  [97.9 F (36.6 C)-98.7 F (37.1 C)] 98.7 F (37.1 C)  Heart Rate:  [98-128] 107  Resp Rate:  [8-24] 13  BP: (97-136)/(65-93) 113/79 mmHg  SpO2: room air  Body mass index is 21.92 kg/(m^2).      Intake/Output Summary (Last 24 hours) at 02/24/15 0606  Last data filed at 02/24/15 0000   Gross per 24 hour   Intake    274 ml   Output    575 ml   Net   -301 ml    GEN: thin jaundiced ill appearing gentleman, appears in pain  HEENT: moist mucus membranes  CV: RRR, no murmurs, no JVD  PULM: ctab, no wheezes/crackles  ABDO: soft, non-tender, non-distended, no guarding/rebound  EXTREMITIES: no lower/upper extremity edema, warm to touch, L lateral thigh with enlarging hematoma and mild swelling and significant tenderness, DP pulses dopplerable bilaterally       Meds:      Scheduled Meds: PRN Meds:        cefTRIAXone 1 g Intravenous Q24H SCH   folic acid 1 mg Oral Daily   lactulose 20 g Oral 4 times per day   multivitamin 1 tablet Oral Daily   pantoprazole 40 mg Intravenous BID   rifaximin 550 mg Oral BID   thiamine 100 mg Oral Daily        sodium chloride  PRN   sodium chloride  PRN   sodium chloride  PRN   sodium chloride  PRN   sodium chloride  PRN   sodium chloride  PRN   sodium chloride  PRN   sodium chloride  PRN   acetaminophen 325 mg Q4H PRN   LORazepam 2-4 mg PRN   naloxone 0.2 mg PRN   ondansetron 4 mg Q8H PRN   Or     ondansetron 4 mg Q8H PRN   traMADol 25 mg Q6H PRN           Labs:     Labs (last 72 hours):      Recent Labs  Lab 02/24/15  0128 02/23/15  1535   WBC 5.99 7.05   HGB 7.5* 7.2*   HEMATOCRIT 20.8* 20.0*   PLATELETS 76*  39*         Recent Labs  Lab 02/24/15  0129 02/23/15  1535 02/22/15  1836   PT 22.1* 23.3* 24.8*   PT INR 2.0* 2.1* 2.3*   PTT  --   --   51*      Recent Labs  Lab 02/24/15  0128 02/23/15  1535   SODIUM 135* 136   POTASSIUM 3.5 3.7   CHLORIDE 107 107   CO2 20* 20*   BUN 13.0 13.0   CREATININE 1.2 1.4*   CALCIUM 7.8* 7.7*   ALBUMIN 2.2* 2.2*   PROTEIN, TOTAL 6.6 6.7   BILIRUBIN, TOTAL 9.2* 8.5*   ALKALINE PHOSPHATASE 117* 121*   ALT 28 29   AST (SGOT) 178* 204*   GLUCOSE 90 95          Microbiology, reviewed and significant for:  MRSAneg    Imaging, reviewed and significant for:  Xr Lumbar Spine Ap And Lateral    02/17/2015   IMPRESSION: No acute osseous abnormality of the lumbar spine.   ReadingStation:[id number]    US Abdomen Limited Ruq    02/23/2015    1. The liver demonstrates a coarsened echotexture with a mildly nodular contour. There is no intrahepatic biliary ductal dilatation or mass. 2. A moderate amount of mobile sludge is identified within a distended gallbladder. There is no gallstone. The gallbladder wall is edematous and may be partially due to the surrounding ascites. If there is high clinical concern of acute cholecystitis a HIDA scan is recommended. 3. There is no hydronephrosis within the right kidney. There is a 1.0 cm benign appearing upper pole cyst. 4. Ascites. The remainder is as above. Stephannie Peters, MD  02/23/2015 8:36 AM     Xr Chest  Ap Portable    02/22/2015   Impression: 1. Left basilar airspace disease and right basilar atelectasis. 2. Left pleural fluid/thickening.  Pennelope Bracken, MD  02/22/2015 4:17 PM     Xr Pelvis Portable    02/17/2015    No acute osseous abnormality of the pelvis.  ReadingStation:[email or web address]        Discussed with attending Dr. Arnell Sieving, Medicine PGY1 Pager [phone]

## 2015-02-24 NOTE — Progress Notes (Addendum)
[facility] Procedure Team     Pt evaluated for paracentesis today. INR undesirable for procedure. INR 2. Please transfuse FFP to get INR <2. Will reevaluate in the AM.       Recent Labs  Lab 02/24/15  0129  02/22/15  1836 02/17/15  1942   PT 22.1* More results in Results Review 24.8* 19.7*   PT INR 2.0* More results in Results Review 2.3* 1.8*   PTT  --   --  51*  --    APTT  --   --   --  56.4*   More results in Results Review = values in this interval not displayed.      Recent Labs  Lab 02/24/15  1220   WBC 6.14   HGB 8.3*   HEMATOCRIT 23.4*   PLATELETS 65*     Filed Vitals:    02/24/15 1100 02/24/15 1200 02/24/15 1230 02/24/15 1245   BP: 133/89  115/82 123/78   Pulse: 114  120 125   Temp:  98.1 F (36.7 C) 98.4 F (36.9 C) 98.1 F (36.7 C)   TempSrc:  Oral Oral Oral   Resp: 18  18 28    Height:       Weight:       SpO2: 100%  99% 100%     Ardine Eng ACNP-BC  [facility] [phone] or [phone]

## 2015-02-24 NOTE — Student Progress (Signed)
MEDICINE STUDENT PROGRESS NOTE    Date Time: 02/24/2015 6:47 AM  Patient Name: Gregory [name]  Attending Physician: Nilsa Nutting Ashiq, *    Subjective   CC: liver failure    Subjective: Patient reports he was unable to sleep overnight because of his left thigh pain. Patient received 4 mg ativan in the past 24 hours. Patient has persistently been tachycardic, 101-141, mostly low 110s. Yesterday afternoon patient received 1 unit pRBC, 2 units FFP, 1 unit platelets and overnight he received 2 units pRBCs. Patient had 1 BM yesterday. Patient received 1 dose of tramadol this morning.     Review of Systems:   Denies n/v/sob  Report left thigh pain    Physical Exam:     VITAL SIGNS PHYSICAL EXAM   Temp:  [97.9 F (36.6 C)-98.7 F (37.1 C)] 98.2 F (36.8 C)  Heart Rate:  [98-141] 125  Resp Rate:  [8-29] 20  BP: (97-136)/(65-93) 121/78 mmHg  Blood Glucose: 96    Telemetry:       Intake/Output Summary (Last 24 hours) at 02/24/15 0647  Last data filed at 02/24/15 0000   Gross per 24 hour   Intake    274 ml   Output    575 ml   Net   -301 ml    General: NAD  Neuro: A+Ox2 (person and president), follows commands  Lungs: CTAB, good air movement  Cardiovascular: Tachycardic, normal S1/S2 without murmurs, rubs, or gallops  Abdomen: Soft, non-tender, non-distended  Extremities: Resting tremor, left thigh hematoma 16 cm x 4 cm, no left palpable pulse, pulse detectable via doppler  Skin: Jaundice       Lines (list foley, central lines, picc lines, and date on insertion):   PIV x2, placed 6/5    Meds (essential medications - list Abx days, group by class):     Scheduled Meds:  Current Facility-Administered Medications   Medication Dose Route Frequency   . cefTRIAXone  1 g Intravenous Q24H SCH   . folic acid  1 mg Oral Daily   . lactulose  30 g Oral 4 times per day   . multivitamin  1 tablet Oral Daily   . pantoprazole  40 mg Intravenous BID   . rifaximin  550 mg Oral BID   . thiamine  100 mg Oral Daily     Continuous  Infusions:     PRN Meds:.sodium chloride, sodium chloride, acetaminophen, LORazepam, naloxone, ondansetron **OR** ondansetron, traMADol      Labs:     Labs (last 72 hours): (ensure neat format, include any other labs not listed below)       Recent Labs  Lab 02/24/15  0128 02/23/15  1535   WBC 5.99 7.05   HGB 7.5* 7.2*   HEMATOCRIT 20.8* 20.0*   PLATELETS 76* 39*         Recent Labs  Lab 02/24/15  0129 02/23/15  1535 02/22/15  1836   PT 22.1* 23.3* 24.8*   PT INR 2.0* 2.1* 2.3*   PTT  --   --  51*      Recent Labs  Lab 02/24/15  0128 02/23/15  1535   SODIUM 135* 136   POTASSIUM 3.5 3.7   CHLORIDE 107 107   CO2 20* 20*   BUN 13.0 13.0   CREATININE 1.2 1.4*   CALCIUM 7.8* 7.7*   ALBUMIN 2.2* 2.2*   PROTEIN, TOTAL 6.6 6.7   BILIRUBIN, TOTAL 9.2* 8.5*   ALKALINE PHOSPHATASE 117* 121*  ALT 28 29   AST (SGOT) 178* 204*   GLUCOSE 90 95                  Microbiology (update daily)  None    Imaging (summarize)  RUQ [place] (6/6): 1. The liver demonstrates a coarsened echotexture with a mildly nodular  contour. There is no intrahepatic biliary ductal dilatation or mass. 2. A moderate amount of mobile sludge is identified within a distended gallbladder. There is no gallstone. The gallbladder wall is edematous and may be partially due to the surrounding ascites. If there is high clinical concern of acute cholecystitis a HIDA scan is recommended. 3. There is no hydronephrosis within the right kidney. There is a 1.0 cm benign appearing upper pole cyst. 4. Ascites. The remainder is as above.  CT (6/5): 1. No renal or ureteral calculi or hydronephrosis. 2. Normal lumbar spine. 3. Left pelvic and hip intramuscular hematoma. 4. Cirrhotic appearing liver with abdominal varices and moderate to large ascites. 5. Left lower lobe atelectasis and moderate pleural effusion.   CXR (6/5): 1. Left basilar airspace disease and right basilar atelectasis. 2. Left pleural fluid/thickening.    Assessment (brief and succinct one liner with diagnoses, not  symptoms):     Mr. Wurtz is a 39 year old male with PMH of alcohol abuse and alcoholic cirrhosis who presents with AMS, nausea, vomiting, anemia, and concern for cholecystitis on imaging.     Plan (by active issues, prioritized.  Can just list the inactive issues):   # Left thigh pain  - S/p mechanical fall last weekend  - CT at outside hospital showed left hip intramuscular hematoma  - Left pedal pulses no palpable, but detectable by doppler  - Large left thigh hematoma possibly cause of anemia  - Continue tramadol PRN  - Follow up ortho recs    # Anemia, likely acute blood loss  - Recent fall with large left sided hip hematoma - could be the source of anemia but cannot rule out GI etiology  - MCV 92.8, not suggestive of megaloblastic anemia  - No evidence of hematemesis and no report of hematemesis. No documented history of varices available  - Hgb 6.5 on arrival  - Hgb stable at 7.5, goal Hgb is 8  - Every 8 hour CBCs, transfuse as needed  - Neurovascular checks every 4 hours  - Protonix IV twice a day  - Follow-up GI recs    # Liver failure likely secondary to alcoholic cirrhosis  - AST and AST elevated in alcoholic pattern, but trending down, AST: 204 --> 178, ALT: 29 --> 28  - INR trending down to 2.0, goal INR is 1.7  - CT at OSH showed cirrhotic appearing liver with abdominal varices and moderate to large ascites  - HBV and HCV were negative  - Meld score 25, 19.6% estimated 60-month mortality  - Ceruloplasmin: 15. Falsely low ceruloplasmin levels may be observed in any protein deficiency state. Although this patient's total protein is in the low-normal range at 6.6, his albumin is low at 2.2. Consider urine copper levels to further assess possible diagnosis of Wilson's disease.   - Iron in normal range at 121 and unsaturated iron binding capacity <17. These both suggest the patient is not iron deficient.   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 EtOH withdrawal  - Continue neuro checks q4 hrs    # Thrombocytopenia likely secondary to cirrhosis  - Platelets trending up: 64 on presentation, now 73  - Differential: secondary to cirrhosis vs. dillutional  - Continue CBCs q 8 hrs    # Nausea and vomiting, could be due to colitis found on CT scan vs. Cholecystitis, resolved   - RUQ [place] (6/5): moderate mobile sludge in a distended gallbladder.   - Protonix as above  - Zofran as needed  - Follow up GI recs    # Hyperbilirubinemia likely secondary to alcoholic cirrhosis vs. Hematoma resorption  - TBili elevated to 9.2 with indirect elevated to 4.2      # Tremor  - Differential diagnosis: asterixis due to hepatic encephalopathy vs. Acute EtOH withdrawal  - No asterixis on exam, likely due to EtOH withdrawal    # Tachycardic  - Likely 2/2 pain and blood loss into thigh hematoma    # Pleural effusion  - F/u CXR 6/8    DVT prophylaxis: SCDs  FEN/GI: Clear liquid diet, replete electrolytes as needed  Code status: full code    Disposition (what needs to be accomplished before the patient returns home):   Anemia stabilized  Home with home health PT or home with supervision (TBA further)    Signed by: Dewitt Rota, BS

## 2015-02-24 NOTE — Consults (Signed)
Orthopaedic Consult    Date Time: 02/24/2015 8:01 AM  Patient Name: Gregory [name] ;   Kathaleen Grinder, * Attending Physician    Time first seen by orthopaedics 810am      Assessment & Plan  Orthopaedic assessment:  39 y/o M with left thigh hematoma    Reductions/Procedures/Splinting performed (indicate type of Anesthesia used):  None    Plan:    1. Will monitor for signs of gluteal compartment syndrome  2. Continue FFP/Vit K to reduce INR  3. Pain control    Pollie Meyer PGY-1  Department of Orthopedics  Page [phone] or call [phone] for questions    Attending Addendum/Attestation:    I have personally seen and examined this patient and have participated in their care. I agree with the clinical information, including the physical exam, patient history, and planning as documented by the Resident or Physician Assistant. In addition, I have edited this note to reflect my findings and plan as well as to incorporate any new data.    On my exam this morning, the patient's mental status is fluctuating.  He  can answer questions appropriately and attempts to participate in physical  examination, but then fades away.  I have looked at his imaging I have  examined him myself in addition to the resident.  I can passively flex his  hip up to about 70 to 80 degrees, I can extend it.  He can wiggle all his  toes and move his ankle.  It is difficult to assess strength because of his  cooperation with the exam.  He is edematous through the buttock and  proximal thigh.  The compartment is compressible and his pain is moderate  to physical examination, although certainly his mental status comes into  play with regards to that.  At this point, he does not have to signs or  symptoms of a clinical compartment syndrome and I do not recommend surgical  intervention at this time.  In reality, this presentation is the result of  coagulopathy and the most prudent decision would be for correction of the  coagulopathy to try  and limit the hematoma then let the body reabsorb it.   Nonetheless, the orthopedic service will follow along with this patient and  perform some serial exams over the next several hours to even a day or so  to see if it evolves and, if he does develop a true compartment syndrome,  then we would need emergent fasciotomy and correction of the coagulopathy.      Emiliano Dyer, MD  Orthopaedic Trauma  Pager [phone]  Office [phone]           HPI    Gregory [name] is a 39 y.o. year old male with a hx of alcohol abuse who presents after a fall from a 40ft ladder onto his left side approximately 1 week ago. Patient was transferred from [facility] where a CT was performed that demonstrated a left thigh hematoma. Patient reports that during hospital course, he no longer can put weight on his LLE and there was concern that hematoma was expanding. INR is currently 2.0. Patient was transferred to [facility] for acute alcohol withdrawal, obstructive jaundice, lactate of 4.7, and monitoring for acute bleeding.     Past Medical and Surgical History      Past Medical History   Diagnosis Date   . Thyroid disease    . Cirrhosis         Past Surgical History  Procedure Laterality Date   . Appendectomy     . Knee surgery       Hardware placement R knee       Past Social History & Family History   Social History:   History     Social History   . Marital Status: Married     Spouse Name: N/A   . Number of Children: N/A   . Years of Education: N/A     Social History Main Topics   . Smoking status: Former Smoker -- 0.50 packs/day     Types: Cigarettes     Quit date: 01/22/2015   . Smokeless tobacco: Never Used   . Alcohol Use: 25.2 oz/week     42 Cans of beer per week      Comment: daily, 6 pack beer q day   . Drug Use: No   . Sexual Activity: Not on file     Other Topics Concern   . Not on file     Social History Narrative       Family History:   Family History   Problem Relation Age of Onset   . Diabetes Mother    . Diabetes Father    .  Diabetes Maternal Grandmother    . Diabetes Maternal Grandfather    . Diabetes Paternal Grandmother    . Diabetes Paternal Grandfather        Relevant Family & Social  history reviewed.  No pertinent family history or social history.      Review of Systems:   All other systems were reviewed and are negative except: as stated in the HPI            Home Medications     Prior to Admission medications    Medication Sig Start Date End Date Taking? Authorizing Provider   LORazepam (ATIVAN) 1 MG tablet Take 1 mg by mouth every 6 (six) hours as needed for Anxiety.    [provider]   oxyCODONE (OXY-IR) 5 MG capsule Take 5 mg by mouth every 4 (four) hours as needed.    [provider]       Allergies     Allergies   Allergen Reactions   . Penicillins Hives     Tolerates ceftriaxone.        Radiology Studies: (actual Orthopaedically relevant films reviewed and read by Orthopaedics)   XR pelvis-no osseus abnormality  CT A/P-no osseus abnormality, intramuscular hematoma                             Physical Exam:     Patient is a 39 y.o. year old male who is ill-appearing, mood is alert  Orientation: Disoriented:   Sometimes    BP 120/86 mmHg  Pulse 119  Temp(Src) 98.3 F (36.8 C) (Oral)  Resp 18  Ht 1.93 m (6\' 4" )  Wt 81.647 kg (180 lb)  BMI 21.92 kg/m2  SpO2 97%  81.647 kg (180 lb)   1.93 m (6\' 4" )    Gait: unable to walk    Heart: Tachycardic to 119  Lungs: no retractions or tachypnea      Right Upper Extremity:   Inspection:  No swelling, erythema, deformity, atrophy or hypertrophy noted  Palpation:  Tenderness-none  ROM:  within normal limits  Joint Stability: normal  Strength: normal  Skin: normal   Peripheral Vascular: normal  Reflexes: did not assess  Sensation: normal  Lymph Nodes: None Palpable  Coordination: Normal      Left Lower Extremity:     Inspection:  swelling and ecchymosis noted to lateral aspect of thigh and left buttock  Palpation:  Tenderness-moderate, compressible  ROM:  Limited  secondary to pain with passive ROM  Joint Stability: normal  Strength: limited by pain   Skin: bruising,   Peripheral Vascular: Tibialis posterior pulse: present and Dorsalis pedis pulse: present  Reflexes: normal  Sensation: normal  Lymph Nodes: None Palpable  Coordination: Normal     Left Upper Extremity:   Inspection:  No swelling, erythema, deformity, atrophy or hypertrophy noted  Palpation:  Tenderness-none  ROM:  within normal limits  Joint Stability: normal  Strength: normal  Skin: normal   Peripheral Vascular: normal  Reflexes: did not assess  Sensation: normal  Lymph Nodes: None Palpable  Coordination: Normal    Right Lower Extremity:   Inspection:  No swelling, erythema, deformity, atrophy or hypertrophy noted  Palpation:  Tenderness-none  ROM:  within normal limits  Joint Stability: normal  Strength: normal  Skin: normal   Peripheral Vascular: normal  Reflexes: normal  Sensation: normal  Lymph Nodes: None Palpable  Coordination: Normal     Pelvis:   Skin: normal  Palpation: Tenderness- none  Stability: normal

## 2015-02-24 NOTE — Plan of Care (Signed)
Problem: Safety  Goal: Patient will be free from injury during hospitalization  Outcome: Progressing  Pt is A&O to self, follows commands intermittently, restless, Lorazepam given x4, MD notified, sitter ordered. PERRLA, sclera jaundice, afebrile. Sinus tach, BP in the 110-140's, room air, no desat. Left hip hematoma worsening, prn pain medicine given x1. Adequate urine output, Lactulose given as ordered, medium BM x1. CIWA done as charted, FFP given x2, no adverse reaction, family updated, purposeful rounding completed, will continue to monitor.

## 2015-02-24 NOTE — Progress Notes (Signed)
Ortho Trauma Progress Note    Patient seen at bedside. Compartment remains compressible. Passive flexion and extension remains stable compared to earlier exam. Wiggles toes. Patient non-contributory regarding pain at this time. Will continue to monitor    Pollie Meyer PGY-1  Department of Orthopedics  Page [phone] or call [phone] for questions

## 2015-02-24 NOTE — Progress Notes (Signed)
[facility]  PROGRESS NOTE  [facility] call: [phone], [phone]  [facility] call: [phone]  After hours call [phone]    Date Time: 02/24/2015 5:25 PM  Patient Name: Gregory [name], Gregory [name]      Chief Complaint:   Anemia, Cirrhosis    Assessment and Plan:   Assessment: 39yoM with etoh abuse presented to [facility] ED with abdominal pain, recent fall and found to have anemia and jaundice. CT with large left hip hematoma, ascites.   1. Anemia in setting of large left hip hematoma, likely main source. No report of overt GI bleeding at this time. H/H stable.    2. Abnormal LFTs, suspect alcoholic hepatitis and underlying known cirrhosis. Need to r/o viral hepatitis, autoimmune hepatitis, Wilson's, hemochromatosis. DF is 55. MELD 24. LFTs improving. Hepatitis BsAg, Hep C Ab neg, Ceruloplasmin 15, iron/TIBC normal; ferritin 1988  3. Alcohol abuse    Plan:   1. Follow H&H and continue to monitor for overt GI bleeding. Transfuse PRBC as appropriate, but goal hct ~22 in setting of cirrhosis and evidence of varices on imaging  2. PPI  3. If patient with tappable ascites, recommend diagnostic paracentesis and check cell count, gram stain, culture, cytology, and albumin  4. Follow LFTs/INR daily  5. CIWA protocol/CATS  6. Lactulose TID, titrate to 2-3 soft stools daily  7. Xifaxan 550mg  BID  8. Follow up with ANA, AMA, ASMA, AFP  9. Encourage nutrition--calorie count.  10. If infectious work up is negative then may consider prednisolone.    Subjective:   He denies abdominal pain, nausea, vomiting, melena, hematochezia. He has tried quitting drinking multiple times in the past.     Medications:     Current Facility-Administered Medications   Medication Dose Route Frequency   . cefTRIAXone  1 g Intravenous Q24H SCH   . folic acid  1 mg Oral Daily   . furosemide  20 mg Intravenous Once   . lactulose  30 g Oral Q4H SCH   . multivitamin  1 tablet Oral Daily   . pantoprazole  40 mg Intravenous BID   .  rifaximin  550 mg Oral BID   . thiamine  100 mg Oral Daily       Review of Systems:   Complete 12 Point ROS negative or normal except those mentioned in HPI above.    Physical Exam:     Filed Vitals:    02/24/15 1615   BP: 127/82   Pulse:    Temp: 98.4 F (36.9 C)   Resp:    SpO2:      General appearance: Thin but appears well nourished, appears stated age and in NAD  Eyes: Sclera icteric, pink conjunctivae, no ptosis  ENMT: mucous membranes moist, nose and ears appear normal.   CV:  no JVD, no LE edema  Abdomen: soft, non-tender, mildly distended,no masses or organomegaly  Skin: + jaundice, no rashes, multiple tattoos  Neuro: CN II-XII grossly intact.  No gross movement disorders noted. No asterixis  Mental status: Appropriate affect, alert and oriented x 3      Labs:     Recent Labs      02/24/15   1220  02/24/15   0128   WBC  6.14  5.99   HGB  8.3*  7.5*   HEMATOCRIT  23.4*  20.8*   PLATELETS  65*  76*   MCV  90.0  91.6       Recent Labs  02/24/15   0128  02/23/15   1535   02/22/15   1536   SODIUM  135*  136   < >  135*   POTASSIUM  3.5  3.7   < >  4.4   CHLORIDE  107  107   < >  104   CO2  20*  20*   < >  17*   BUN  13.0  13.0   < >  9.0   CREATININE  1.2  1.4*   < >  1.4*   GLUCOSE  90  95   < >  121*   CALCIUM  7.8*  7.7*   < >  7.8*   MAGNESIUM   --   1.8   --   1.6   PHOSPHORUS   --   3.8   --    --     < > = values in this interval not displayed.       Recent Labs      02/24/15   0128  02/23/15   1535   AST (SGOT)  178*  204*   ALT  28  29   ALKALINE PHOSPHATASE  117*  121*   BILIRUBIN, TOTAL  9.2*  8.5*   BILIRUBIN, DIRECT  5.0*  5.0*   PROTEIN, TOTAL  6.6  6.7   ALBUMIN  2.2*  2.2*       Recent Labs      02/24/15   0129  02/23/15   1535  02/22/15   1836   PTT   --    --   51*   PT  22.1*  23.3*  24.8*   PT INR  2.0*  2.1*  2.3*        Radiology:   Radiological Procedure reviewed:    [place] RUQ 02/23/15:   1. The liver demonstrates a coarsened echotexture with a mildly nodular contour. There is no  intrahepatic biliary ductal dilatation or mass.  2. A moderate amount of mobile sludge is identified within a distended gallbladder. There is no gallstone. The gallbladder wall is edematous and may be partially due to the surrounding ascites. If there is high clinical concern of acute cholecystitis a HIDA scan is recommended.  3. There is no hydronephrosis within the right kidney. There is a 1.0 cm benign appearing upper pole cyst.  4. Ascites. The remainder is as above.  Endoscopy:         Pathology:

## 2015-02-24 NOTE — Progress Notes (Signed)
Pt disoriented to place, MAE, FC, confused at times and tries to get OOB, unsteady on feet to commode, 2 person assist. RA, no desats. Afebrile, ST 100-110s, SBP 110-130s. AUOP via urinal, frequent cleanups. Clear liquid diet. MD notified of L hip swelling and pain. 2 units of RBCs tranfused, no adverse reactions. Plan to have paracentesis. Will continue to monitor.

## 2015-02-25 LAB — CBC
Hematocrit: 23.9 % — ABNORMAL LOW (ref 42.0–52.0)
Hematocrit: 23.8 % — ABNORMAL LOW (ref 42.0–52.0)
Hematocrit: 23.6 % — ABNORMAL LOW (ref 42.0–52.0)
Hgb: 8.6 g/dL — ABNORMAL LOW (ref 13.0–17.0)
Hgb: 8.4 g/dL — ABNORMAL LOW (ref 13.0–17.0)
Hgb: 8.3 g/dL — ABNORMAL LOW (ref 13.0–17.0)
MCH: 33 pg — ABNORMAL HIGH (ref 28.0–32.0)
MCH: 32.4 pg — ABNORMAL HIGH (ref 28.0–32.0)
MCH: 32.3 pg — ABNORMAL HIGH (ref 28.0–32.0)
MCHC: 36 g/dL (ref 32.0–36.0)
MCHC: 35.3 g/dL (ref 32.0–36.0)
MCHC: 35.2 g/dL (ref 32.0–36.0)
MCV: 91.6 fL (ref 80.0–100.0)
MCV: 91.9 fL (ref 80.0–100.0)
MCV: 91.8 fL (ref 80.0–100.0)
MPV: 11.2 fL (ref 9.4–12.3)
MPV: 10.7 fL (ref 9.4–12.3)
MPV: 10.1 fL (ref 9.4–12.3)
Nucleated RBC: 0 /100 WBC (ref 0–1)
Nucleated RBC: 0 /100 WBC (ref 0–1)
Nucleated RBC: 0 /100 WBC (ref 0–1)
Platelets: 63 10*3/uL — ABNORMAL LOW (ref 140–400)
Platelets: 62 10*3/uL — ABNORMAL LOW (ref 140–400)
Platelets: 67 10*3/uL — ABNORMAL LOW (ref 140–400)
RBC: 2.61 10*6/uL — ABNORMAL LOW (ref 4.70–6.00)
RBC: 2.59 10*6/uL — ABNORMAL LOW (ref 4.70–6.00)
RBC: 2.57 10*6/uL — ABNORMAL LOW (ref 4.70–6.00)
RDW: 21 % — ABNORMAL HIGH (ref 12–15)
RDW: 22 % — ABNORMAL HIGH (ref 12–15)
RDW: 22 % — ABNORMAL HIGH (ref 12–15)
WBC: 8.68 10*3/uL (ref 3.50–10.80)
WBC: 9.22 10*3/uL (ref 3.50–10.80)
WBC: 9.71 10*3/uL (ref 3.50–10.80)

## 2015-02-25 LAB — FIBRINOGEN: Fibrinogen: 133 mg/dL — ABNORMAL LOW (ref 189–458)

## 2015-02-25 LAB — PREPARE RBC
Expiration Date: 201606252359
Status: TRANSFUSED
UTYPE: O POS

## 2015-02-25 LAB — PREPARE FRESH FROZEN PLASMA
Expiration Date: 201606121409
Expiration Date: 201606131423
Status: TRANSFUSED
Status: TRANSFUSED
UTYPE: A POS
UTYPE: O POS

## 2015-02-25 LAB — BASIC METABOLIC PANEL
BUN: 14 mg/dL (ref 9.0–28.0)
CO2: 20 mEq/L — ABNORMAL LOW (ref 22–29)
Calcium: 7.9 mg/dL — ABNORMAL LOW (ref 8.5–10.5)
Chloride: 107 mEq/L (ref 100–111)
Creatinine: 1.2 mg/dL (ref 0.7–1.3)
Glucose: 87 mg/dL (ref 70–100)
Potassium: 3.4 mEq/L — ABNORMAL LOW (ref 3.5–5.1)
Sodium: 137 mEq/L (ref 136–145)

## 2015-02-25 LAB — HEPATIC FUNCTION PANEL
ALT: 29 U/L (ref 0–55)
AST (SGOT): 178 U/L — ABNORMAL HIGH (ref 5–34)
Albumin/Globulin Ratio: 0.5 — ABNORMAL LOW (ref 0.9–2.2)
Albumin: 2.2 g/dL — ABNORMAL LOW (ref 3.5–5.0)
Alkaline Phosphatase: 118 U/L — ABNORMAL HIGH (ref 38–106)
Bilirubin Direct: 6.1 mg/dL — ABNORMAL HIGH (ref 0.0–0.5)
Bilirubin Indirect: 5.1 mg/dL — ABNORMAL HIGH (ref 0.0–1.1)
Bilirubin, Total: 11.2 mg/dL — ABNORMAL HIGH (ref 0.2–1.2)
Globulin: 4.6 g/dL — ABNORMAL HIGH (ref 2.0–3.6)
Protein, Total: 6.8 g/dL (ref 6.0–8.3)

## 2015-02-25 LAB — GFR: EGFR: >60

## 2015-02-25 LAB — PT/INR
PT INR: 2.1 — ABNORMAL HIGH (ref 0.9–1.1)
PT: 23.5 s — ABNORMAL HIGH (ref 12.6–15.0)

## 2015-02-25 LAB — MITOCHONDRIAL ANTIBODIES, M2: Mitochondria M2 Ab (IgG): <=20 U (ref <=20.0)

## 2015-02-25 MED ORDER — LACTULOSE 10 GM/15ML PO SOLN
30.0000 g | Freq: Four times a day (QID) | ORAL | Status: DC
Start: 2015-02-25 — End: 2015-02-27
  Administered 2015-02-26: 30 g via ORAL
  Filled 2015-02-25 (×2): qty 60

## 2015-02-25 MED ORDER — SODIUM CHLORIDE 0.9 % IV SOLN
INTRAVENOUS | Status: DC | PRN
Start: 2015-02-25 — End: 2015-02-27

## 2015-02-25 NOTE — Student Other (Signed)
Hospital Course:    HPI: Gregory [name] is a 39 year old male with a PMH of alcohol abuse who presents with jaundice, left flank pain, emesis, and AMS in the setting of liver failure who was transferred from [facility] ED. He had an CT scan done at the outside hospital which was reviewed with one of our radiologists here. The CT noted a distended gallbladder, large left hip intramuscular hematoma, large ascites, left-sided pleural effusion, no free air in the abdomen but evidence of right colonic wall thickening concerning for colitis. Patient also reports left hip pain secondary to a mechanical fall while camping last weekend. In the ER, bilirubin was elevated to 7, suggestive of worsening liver failure and hemoglobin was decreased to 6.5, for which he received a blood transfusion. In the ED, lactic acid was elevated to 4.7, for which he received IV fluids.     Regarding the left thigh hematoma, the medicine and orthopedics teams performed serial neurovascular exams of the left lower extremity to monitor for the development of compartment syndrome.     The source of the patient's anemia is believed to be the left thigh hematoma. A less likely cause of the anemia is a GI bleed, for which GI was consulted and the patient was maintained on Protonix IV BID. The patient's hemoglobin and hematocrit were serially monitored and he was transfused with pRBCs to maintain a hemoglobin between 7-8.     The patient has a history of liver failure likely secondary to alcoholic liver disease. The transaminitis followed an alcoholic pattern and the AST and ALT trended down during the hospitalization. Other etiologies for liver failure, including HAV, HBV, and HCV were negative. ANA and AFP were also negative. Ceruloplasmin was decreased to 15, but there is a low suspicion for Wilson's Disease in this patient.     Coagulopathy, with an INR elevated to 2.3 upon arrival, is associated with the liver failure. The patient was transfused with  several units of FFP to correct the elevated INR.     The patient was transferred to [facility] with thrombocytopenia. His platelets were 64 on presentation and he received platelets.     The CT at the OSH noted moderate to large ascites, which was not evident on exam. The procedure team evaluated the patient and did not feel there was sufficient fluid to perform a paracentesis. Additionally, the procedure team requested the patient's INR be <2 prior to performing any procedure. The patient was empirically started on ceftriaxone for SBP, however with low suspicion of SBP, the antibiotics were d/c'ed after 2 days.     With regard to alcohol withdrawal, the CIWA protocol was followed and the patient was administered Ativan as needed. The patient was also given a daily multivitamin, thiamine, and folic acid.       Imaging:    CXR (6/7): Basal opacities and left pleural effusion increased.    RUQ [place] (6/6): 1. The liver demonstrates a coarsened echotexture with a mildly nodular  contour. There is no intrahepatic biliary ductal dilatation or mass. 2. A moderate amount of mobile sludge is identified within a distended gallbladder. There is no gallstone. The gallbladder wall is edematous and may be partially due to the surrounding ascites. If there is high clinical concern of acute cholecystitis a HIDA scan is recommended. 3. There is no hydronephrosis within the right kidney. There is a 1.0 cm benign appearing upper pole cyst. 4. Ascites. The remainder is as above.  CT (6/5 at OSH): 1. No renal or ureteral calculi or hydronephrosis. 2. Normal lumbar spine. 3. Left pelvic and hip intramuscular hematoma. 4. Cirrhotic appearing liver with abdominal varices and moderate to large ascites. 5. Left lower lobe atelectasis and moderate pleural effusion.     CXR (6/5): 1. Left basilar airspace disease and right basilar atelectasis. 2. Left pleural fluid/thickening.      Consults:  GI  Orthopedics

## 2015-02-25 NOTE — Plan of Care (Signed)
Problem: Safety  Goal: Patient will be free from injury during hospitalization  Outcome: Progressing  Pt is confused and drowsy, A&O to place and self. On room air, no desat. Tmax 100.2, tachycardic, BP in the 100-130's, pulse palpable on all extremities. On clear liquid, poor appetite, 2 BM today, Lactulose held, urine amber, encouraged patient to drink more water. Bruises on left hip. FFP given x2, Lorazepam given x1, Tramadol given x1. Purposeful rounding completed, will continue to monitor, sitter in room, will continue to monitor.

## 2015-02-25 NOTE — Progress Note - Problem Oriented Charting Notewrit (Signed)
MEDICINE PROGRESS NOTE    Date Time: 02/25/2015 6:23 AM  Patient Name: Gregory [name]  Attending Physician: Lucilla Edin, MD   Medicine Resident Team A  Spectra: [phone]  Night: [phone]    Attention CONSULTANTS:   This patient is covered by the internal medicine teaching service.   Except in cases of emergency, please contact the medicine intern   or resident first for any questions or to place orders.      Assessment/Plan:   39yo gentleman with history of alcoholic cirrhosis, who was transferred from [facility] ED on 6/5 with depressed mental status and found to have anemia concerning for acute blood loss, as well as ascites and biliary sludge.  Persistent anemia, thrombocytopenia, INR elevation, requiring blood product transfusion, and now with enlarging hematoma and gum bleeding.        #Anemia  Recent fall with left sided hip trauma with L thigh hematoma noted on OSH CT likely source of anemia, and hematoma enlarging on exam.  Cannot rule out GI bleed as pt reports taking ibuprofen for thigh pain at home, but no known history of hematemesis/GI bleed.   - Ortho and GI consulted - f/u recs  - 2U FFP now given ongoing INR elevation and persistent bleed, goal INR <1.5  - Monitor CBC 8h, transfuse for Hct <22  - Protonix IV 40mg  BID    #LFT elevation and hepatic cirrhosis, likely from EtOH use, with large ascites noted on outside CT  MELD score 26.7.  HepBsAg, HepC Ab neg. Ceruloplasmin 15 (slightly low) - ?[place].  - Diagnostic and therapeutic paracentesis ordered, although may not have sufficient ascites to tap per RUQ [place] and will need further reversal of INR before able to proceed  - discontinue Ceftriaxone today as low suspicion for GI bleed and ascites is minimal on [place]  - Lactulose, titrate to 2-3 soft stools daily  - Rifaximin 550mg  BID  - f/u ANA, AMA, ASMA, AFP    #L thigh hematoma  Recent fall with left sided hip trauma and L thigh hematoma noted on OSH CT  - Neurovascular checks of LLE  - Low dose  tylenol and tramadol for pain     #Active alcohol abuse  - CIWA protocol  - PO multivitamin, folic acid, and thiamine  - CATS consult    #Altered mental status  Likely from dilaudid and ativan given outside hospital but can rule out hepatic encephalopathy.    - Lactulose and rifaximin as above.  Avoid sedating meds as much as possible.     #Sinus tachycardia   Likely multi-factorial, from pain, anxiety, EtOH withdrawal.    - Continue CIWA, monitor fluid status      #Thrombocytopenia likely secondary to cirrhosis  - Monitor with daily CBC.  FFP as above.     #Distended gallbladder on outside CT scan  6/6 RUQ US showed mobile sludge seen within a distended gallbladder and edematous gallbladder wall is edematous (which may be partially due to the surrounding ascites), no gallstone.    - Per GI, exam not concerning, hold off on HIDA scan for now    FEN: clear liquid diet  Ppx: SCD for DVT  Code: FULL       Safety Checklist:     DVT prophylaxis:  CHEST guideline (See page e199S) Mechanical   Foley:  [place] Rn Foley protocol Not present   IVs:  Peripheral IV   PT/OT: Ordered   Daily CBC & or Chem ordered:  SHM/ABIM guidelines (see #  5) Yes, due to clinical and lab instability   Reference for approximate charges of common labs: CBC auto diff - $76  BMP - $99  Mg - $79    Disposition:     Anticipated discharge needs: TBD    Subjective     CC: anemia     24 hour events: Worsening hematuria noted by nursing staff.  Received 2U FFP during the day, 1U pRBC overnight.  Agitated and trying to climb out of bed - given ativan 4mg  x2, haldol 0.5mg  x1, gabapentin 300mg  x1. 9 bowel movements overnight.     Subjective: Difficult to awake this morning.  Opens eyes, but cannot maintain convrsation and falls asleep quickly.  Drowsy and unable to obtain full ROS.        Review of Systems:     As above    Physical Exam:     VITAL SIGNS PHYSICAL EXAM   Temp:  [97.8 F (36.6 C)-99.1 F (37.3 C)] 99 F (37.2 C)  Heart Rate:  [109-218]  114  Resp Rate:  [0-28] 22  BP: (106-160)/(69-94) 132/84 mmHg  SpO2: room air  Body mass index is 21.92 kg/(m^2).      Intake/Output Summary (Last 24 hours) at 02/25/15 0623  Last data filed at 02/25/15 0409   Gross per 24 hour   Intake 1169.58 ml   Output    300 ml   Net 869.58 ml    GEN: thin jaundiced ill appearing gentleman, lying in bed with head low and teeth shattering  HEENT: mild gum bleeding  CV: RRR, no murmurs, no JVD  PULM: ctab, no wheezes/crackles  ABDO: soft, non-tender, non-distended, no guarding/rebound  EXTREMITIES: no lower/upper extremity edema, L lateral thigh with enlarging hematoma and mild swelling and significant tenderness, DP pulses dopplerable bilaterally but L foot cool to touch  NEURO: opens eyes after shaking pt multiple times but not oriented to time/person/place, easily falls back asleep       Meds:      Scheduled Meds: PRN Meds:        cefTRIAXone 1 g Intravenous Q24H SCH   folic acid 1 mg Oral Daily   lactulose 30 g Oral Q4H SCH   multivitamin 1 tablet Oral Daily   pantoprazole 40 mg Intravenous BID   rifaximin 550 mg Oral BID   thiamine 100 mg Oral Daily        sodium chloride  PRN   acetaminophen 325 mg Q4H PRN   LORazepam 2-4 mg PRN   naloxone 0.2 mg PRN   ondansetron 4 mg Q8H PRN   Or     ondansetron 4 mg Q8H PRN   traMADol 25 mg Q6H PRN           Labs:     Labs (last 72 hours):      Recent Labs  Lab 02/24/15  2117 02/24/15  1220   WBC 7.56 6.14   HGB 7.9* 8.3*   HEMATOCRIT 21.6* 23.4*   PLATELETS 62* 65*         Recent Labs  Lab 02/24/15  0129 02/23/15  1535 02/22/15  1836   PT 22.1* 23.3* 24.8*   PT INR 2.0* 2.1* 2.3*   PTT  --   --  51*      Recent Labs  Lab 02/24/15  0128 02/23/15  1535   SODIUM 135* 136   POTASSIUM 3.5 3.7   CHLORIDE 107 107   CO2 20* 20*   BUN 13.0 13.0  CREATININE 1.2 1.4*   CALCIUM 7.8* 7.7*   ALBUMIN 2.2* 2.2*   PROTEIN, TOTAL 6.6 6.7   BILIRUBIN, TOTAL 9.2* 8.5*   ALKALINE PHOSPHATASE 117* 121*   ALT 28 29   AST (SGOT) 178* 204*   GLUCOSE 90 95           Microbiology, reviewed and significant for:  MRSAneg    Imaging, reviewed and significant for:  Xr Lumbar Spine Ap And Lateral    02/17/2015   IMPRESSION: No acute osseous abnormality of the lumbar spine.   ReadingStation:[id number]    US Abdomen Limited Ruq    02/23/2015    1. The liver demonstrates a coarsened echotexture with a mildly nodular contour. There is no intrahepatic biliary ductal dilatation or mass. 2. A moderate amount of mobile sludge is identified within a distended gallbladder. There is no gallstone. The gallbladder wall is edematous and may be partially due to the surrounding ascites. If there is high clinical concern of acute cholecystitis a HIDA scan is recommended. 3. There is no hydronephrosis within the right kidney. There is a 1.0 cm benign appearing upper pole cyst. 4. Ascites. The remainder is as above. Stephannie Peters, MD  02/23/2015 8:36 AM     Xr Chest Ap Portable    02/24/2015    Basal opacities and left pleural effusion increased.  Clide Cliff, MD  02/24/2015 10:00 AM     Xr Chest  Ap Portable    02/22/2015   Impression: 1. Left basilar airspace disease and right basilar atelectasis. 2. Left pleural fluid/thickening.  Pennelope Bracken, MD  02/22/2015 4:17 PM     Xr Pelvis Portable    02/17/2015    No acute osseous abnormality of the pelvis.  ReadingStation:[id number]        Discussed with attending Dr. Shaaron Adler, Medicine PGY1 Pager [phone]

## 2015-02-25 NOTE — Consults (Signed)
[facility] CONSULTATION NOTE    Patient name:  Gregory [name], Gregory [name]  Date of birth: 1976-04-25  Age:  39  MRN:  [id number]  Date of admission:  02/22/15  Date of consultation: 02/24/15  Attending/Referring Physician:  Dr. Tedra Coupe  ==================================================    REASON FOR ADMISSION:  ETOH abuse     REASON FOR CONSULTATION (Chief Complaint)  ETOH abuse     HISTORY OF PRESENT ILLNESS:  Gregory [name] is a 39 y.o. male who presents to the hospital with jaundice and emesis to an outside hospital. Per documentation, it appears that he had some left sided flank pain after a fall last week. He went to the emergency room at that time and had x-rays done which showed no fractures. He is transferred here from [facility] emergency room. On arrival he was obtunded, but it appears as though he received both Ativan and Dilaudid at the outside hospital. Per EMS reports he consumed alcohol at a wedding yesterday.    Unable to take adequate history at this time due to patient's mental status.    He had an CT scan done at the outside hospital which was reviewed with one of our radiologists here. Noted a distended gallbladder, large left hip intramuscular hematoma, large ascites, left-sided pleural effusion, no free air in the abdomen but evidence of right colonic wall thickening concerning for colitis.      SPECIFIC SUBSTANCES USED:  Alcohol abuse, onset in teenage years. Patient states 5-6 beers daily.   Former smoker. 1/2 pack. Quit in 01/22/2015.  Denies any other substance abuse.  Denies blackouts or seizures. Withdrawal sx: tremors, hallucinations.     PAST SUBSTANCE ABUSE TREATMENT HISTORY:  Per patient, several rehab/detox centers for Alcohol. Patient states connected to [facility].     MEDICAL/SURGICAL/PSYCHIATRIC HISTORY:  Past Medical History   Diagnosis Date   . Thyroid disease    . Cirrhosis      Past Surgical History   Procedure Laterality Date   . Appendectomy     . Knee surgery       Hardware placement R  knee        Denies any psychiatric history.    SOCIAL HISTORY:  Lives with friend. Currently unemployed. 6 DUIs but denies any further legal history. No children, not married.     PSYCHIATRIC/ SUBSTANCE ABUSE FAMILY HISTORY:  ETOH abuse (parents and siblings).    Review of Systems - unable to assess    PE:  General: alert and oriented 2, (stated current year was 1976) lying comfortably, limited cooperation, drowsiness  Pulmonary: breathing non-labored, no shortness of breath  Cardiac: skin pink, no peripheral edema, no JVD  Abdomen: no guarding noted  Neurologic: cranial nerves grossly intact, facial expression symmetrical, no gross neurological deficits, no overt tremors present  Skin: intact to general inspection, no ulcers or lesion appreciated       MENTAL STATUS EXAM:    Mental Status Evaluation:     Appearance:  Caucasian male, appropriate age, laying in bed, sleeping, minimally arousable   Behavior:  Calm, limited cooperation (due to drowsiness), and positive signs of psychomotor retardation.   Speech:  Fluid, purposeful; interruptible, with normal pitch, normal volume and slowed rate.    Mood:  " I need help."   Affect:  mood-congruent; redirectable; and appearing calm   Thought Process:  Linear; goal directed and disorganized   Thought Content:  Pt denies any delusions, A/V Hallucinations and expressed no active thoughts of SI/HI.  Sensorium:   A&Ox 2 [person, place,]   Cognition:   unable to assess    Insight:  Fair    Judgment:  Poor          DATA REVIEWED:  Results     Procedure Component Value Units Date/Time    CBC [[id number]]  (Abnormal) Collected:  02/25/15 1400    Specimen Information:  Blood from Blood Updated:  02/25/15 1422     WBC 9.22 x10 3/uL      Hgb 8.4 (L) g/dL      Hematocrit 95.2 (L) %      Platelets 62 (L) x10 3/uL      RBC 2.59 (L) x10 6/uL      MCV 91.9 fL      MCH 32.4 (H) pg      MCHC 35.3 g/dL      RDW 22 (H) %      MPV 10.7 fL      Nucleated RBC 0 /100 WBC     Prepare fresh frozen  plasma [[id number]] Collected:  02/25/15 1117    Specimen Information:  Blood Updated:  02/25/15 1207     Plasma Plasma      BLUNIT [id number]      Status issued      PRODUCT CODE (NON READABLE) E2121V00      Expiration Date [id number]      UTYPE A POS     Fibrinogen [[id number]]  (Abnormal) Collected:  02/25/15 0743     Fibrinogen 133 (L) mg/dL Updated:  41/66/06 3016    Prothrombin time/INR [[id number]]  (Abnormal) Collected:  02/25/15 0743    Specimen Information:  Blood Updated:  02/25/15 0822     PT 23.5 (H) sec      PT INR 2.1 (H)      PT Anticoag. Given Within 48 hrs. None     Basic Metabolic Panel [[id number]]  (Abnormal) Collected:  02/25/15 0616    Specimen Information:  Blood Updated:  02/25/15 0708     Glucose 87 mg/dL      BUN 70.6 mg/dL      Creatinine 1.2 mg/dL      Calcium 7.9 (L) mg/dL      Sodium 237 mEq/L      Potassium 3.4 (L) mEq/L      Chloride 107 mEq/L      CO2 20 (L) mEq/L     Hepatic function panel (LFT) [[id number]]  (Abnormal) Collected:  02/25/15 0616    Specimen Information:  Blood Updated:  02/25/15 0708     Bilirubin, Total 11.2 (H) mg/dL      Bilirubin, Direct 6.1 (H) mg/dL      Bilirubin, Indirect 5.1 (H) mg/dL      AST (SGOT) 160 (H) U/L      ALT 29 U/L      Alkaline Phosphatase 118 (H) U/L      Protein, Total 6.8 g/dL      Albumin 2.2 (L) g/dL      Globulin 4.6 (H) g/dL      Albumin/Globulin Ratio 0.5 (L)     GFR [[id number]] Collected:  02/25/15 0616     EGFR >60.0 Updated:  02/25/15 0708    CBC [[id number]]  (Abnormal) Collected:  02/25/15 0616    Specimen Information:  Blood from Blood Updated:  02/25/15 0704     WBC 8.68 x10 3/uL      Hgb 8.6 (L) g/dL      Hematocrit 50.0 (L) %  Platelets 63 (L) x10 3/uL      RBC 2.61 (L) x10 6/uL      MCV 91.6 fL      MCH 33.0 (H) pg      MCHC 36.0 g/dL      RDW 21 (H) %      MPV 11.2 fL      Nucleated RBC 0 /100 WBC     Prepare RBC: one unit RBC [[id number]] Collected:  02/22/15 1543     RBC Leukoreduced RBC Leukoreduced Updated:  02/25/15  0056     BLUNIT [id number]      Status issued      PRODUCT CODE (NON READABLE) E0336V00      Expiration Date [id number]      UTYPE O POS     Prepare fresh frozen plasma [[id number]] Collected:  02/24/15 0734    Specimen Information:  Blood Updated:  02/25/15 0042     Plasma Plasma      BLUNIT [id number]      Status transfused      PRODUCT CODE (NON READABLE) E2121V00      Expiration Date [id number]      UTYPE A POS      Plasma Plasma      BLUNIT [id number]      Status transfused      PRODUCT CODE (NON READABLE) J8841Y60      Expiration Date [id number]      UTYPE O POS     Prepare RBC: one unit RBC [[id number]] Collected:  02/22/15 1543     RBC Leukoreduced RBC Leukoreduced Updated:  02/25/15 0042     BLUNIT [id number]      Status transfused      PRODUCT CODE (NON READABLE) E0336V00      Expiration Date [id number]      Wetzel Bjornstad POS     Ferritin [[id number]]  (Abnormal) Collected:  02/24/15 0128    Specimen Information:  Blood Updated:  02/24/15 2252     Ferritin 1973.79 (H) ng/mL     CBC [[id number]]  (Abnormal) Collected:  02/24/15 2117    Specimen Information:  Blood from Blood Updated:  02/24/15 2136     WBC 7.56 x10 3/uL      Hgb 7.9 (L) g/dL      Hematocrit 81.8 (L) %      Platelets 62 (L) x10 3/uL      RBC 2.40 (L) x10 6/uL      MCV 90.0 fL      MCH 32.9 (H) pg      MCHC 36.6 (H) g/dL      RDW 22 (H) %      MPV 10.5 fL      Nucleated RBC 1 /100 WBC     Narrative:      Please check 1h after FFP    Hepatitis A antibody, IgM [id number] Collected:  02/24/15 0128    Specimen Information:  Blood Updated:  02/24/15 2046     Hep A IgM NonReactive     Hepatitis A antibody, total [[id number]]  (Abnormal) Collected:  02/24/15 0128    Specimen Information:  Blood Updated:  02/24/15 2045     Hepatitis A Total Antibody Reactive (A)     Hepatitis B (HBV) Core antibody, IgM [[id number]] Collected:  02/24/15 0128    Specimen Information:  Blood Updated:  02/24/15 2043     Hep B C IgM NonReactive     AFP tumor  marker [[id number]] Collected:  02/24/15 0128  Specimen Information:  Blood Updated:  02/24/15 2005     Alpha-Fetoprotein 7.1 ng/mL     MRSA culture [[id number]] Collected:  02/23/15 1413    Specimen Information:  Body Fluid from Nasal/Throat ASC Admission Updated:  02/24/15 1829    Narrative:      ORDER#: [id number]                                    ORDERED BY: Shawnie Pons  SOURCE: Nares and Throat                             COLLECTED:  02/23/15 14:13  ANTIBIOTICS AT COLL.:                                RECEIVED :  02/23/15 18:14  Culture MRSA Surveillance                  FINAL       02/24/15 18:29  02/24/15   Negative for Methicillin Resistant Staph aureus from Nares and             Negative for Methicillin Resistant Staph aureus from Throat      ANA Screen [[id number]] Collected:  02/24/15 0128    Specimen Information:  Blood Updated:  02/24/15 1537     ANA Screen Negative     CBC Pathologist Review [[id number]] Collected:  02/22/15 1536     CBC Pathologist Review See Note Updated:  02/24/15 1526    CBC with differential [[id number]]  (Abnormal) Collected:  02/22/15 1536    Specimen Information:  Blood from Blood Updated:  02/24/15 1525     WBC 9.54 x10 3/uL      Hgb 6.5 (L) g/dL      Hematocrit 24.4 (L) %      Platelets 64 (L) x10 3/uL      RBC 1.88 (L) x10 6/uL      MCV 95.7 fL      MCH 34.6 (H) pg      MCHC 36.1 (H) g/dL      RDW 22 (H) %      MPV 10.5 fL     Haptoglobin [[id number]]  (Abnormal) Collected:  02/24/15 0128    Specimen Information:  Blood Updated:  02/24/15 1321     Haptoglobin <8 (L) mg/dL     Ferritin [[id number]]  (Abnormal) Collected:  02/24/15 0128    Specimen Information:  Blood Updated:  02/24/15 1321     Ferritin 1988.72 (H) ng/mL     Hemolysis index [[id number]] Collected:  02/24/15 0128     Hemolysis Index 15 Updated:  02/24/15 1257    Lactate dehydrogenase [[id number]] Collected:  02/24/15 1220    Specimen Information:  Blood Updated:  02/24/15 1250     LDH 225 U/L     CBC [[id number]]   (Abnormal) Collected:  02/24/15 1220    Specimen Information:  Blood from Blood Updated:  02/24/15 1242     WBC 6.14 x10 3/uL      Hgb 8.3 (L) g/dL      Hematocrit 60.1 (L) %      Platelets 65 (L) x10 3/uL      RBC 2.60 (L) x10 6/uL      MCV 90.0 fL      MCH  31.9 pg      MCHC 35.5 g/dL      RDW 21 (H) %      MPV 10.4 fL      Nucleated RBC 0 /100 WBC     IRON PROFILE [[id number]]  (Abnormal) Collected:  02/24/15 0128     Iron 121 ug/dL Updated:  62/13/08 6578     UIBC <17 (L) ug/dL      TIBC see below ug/dL      Iron Saturation see below %     Hepatitis B (HBV) Surface Antibody Quant [[id number]] Collected:  02/24/15 0128    Specimen Information:  Blood Updated:  02/24/15 0438     HEPATITIS B SURFACE ANTIBODY 11.58     Hepatitis C (HCV) antibody, Total [[id number]] Collected:  02/24/15 0128    Specimen Information:  Blood Updated:  02/24/15 0433     Hepatitis C, AB Non-Reactive     Hepatitis B (HBV) Surface Antigen [[id number]] Collected:  02/24/15 0128    Specimen Information:  Blood Updated:  02/24/15 0432     Hepatitis B Surface AG Non-Reactive     Hemolysis index [[id number]] Collected:  02/24/15 0128     Hemolysis Index 16 Updated:  02/24/15 0411    Ceruloplasmin [[id number]]  (Abnormal) Collected:  02/24/15 0128    Specimen Information:  Blood Updated:  02/24/15 0411     Ceruloplasmin 15 (L) mg/dL     CBC [[id number]]  (Abnormal) Collected:  02/24/15 0128    Specimen Information:  Blood from Blood Updated:  02/24/15 0331     WBC 5.99 x10 3/uL      Hgb 7.5 (L) g/dL      Hematocrit 60.6 (L) %      Platelets 76 (L) x10 3/uL      RBC 2.27 (L) x10 6/uL      MCV 91.6 fL      MCH 33.0 (H) pg      MCHC 36.1 (H) g/dL      RDW 21 (H) %      MPV 10.0 fL      Nucleated RBC 0 /100 WBC     Prothrombin time/INR [[id number]]  (Abnormal) Collected:  02/24/15 0129    Specimen Information:  Blood Updated:  02/24/15 0155     PT 22.1 (H) sec      PT INR 2.0 (H)      PT Anticoag. Given Within 48 hrs. None     Basic Metabolic Panel  [[id number]]  (Abnormal) Collected:  02/24/15 0128    Specimen Information:  Blood Updated:  02/24/15 0153     Glucose 90 mg/dL      BUN 25.4 mg/dL      Creatinine 1.2 mg/dL      Calcium 7.8 (L) mg/dL      Sodium 270 (L) mEq/L      Potassium 3.5 mEq/L      Chloride 107 mEq/L      CO2 20 (L) mEq/L     Hepatic function panel (LFT) [[id number]]  (Abnormal) Collected:  02/24/15 0128    Specimen Information:  Blood Updated:  02/24/15 0153     Bilirubin, Total 9.2 (H) mg/dL      Bilirubin, Direct 5.0 (H) mg/dL      Bilirubin, Indirect 4.2 (H) mg/dL      AST (SGOT) 517 (H) U/L      ALT 28 U/L      Alkaline Phosphatase 117 (H) U/L      Protein,  Total 6.6 g/dL      Albumin 2.2 (L) g/dL      Globulin 4.4 (H) g/dL      Albumin/Globulin Ratio 0.5 (L)     GFR [[id number]] Collected:  02/24/15 0128     EGFR >60.0 Updated:  02/24/15 0153    Mitochondrial M2 AB, IgG [[id number]] Collected:  02/24/15 0128    Specimen Information:  Blood Updated:  02/24/15 0136    Narrative:      Deliver to laboratory immediately.    Prepare fresh frozen plasma [[id number]] Collected:  02/23/15 1006    Specimen Information:  Blood Updated:  02/24/15 0042     Plasma Plasma      BLUNIT [id number]      Status transfused      PRODUCT CODE (NON READABLE) E5550V00      Expiration Date [id number]      UTYPE A POS      Plasma Plasma      BLUNIT [id number]      Status transfused      PRODUCT CODE (NON READABLE) B7628B15      Expiration Date [id number]      UTYPE A POS     Prepare Apheresis Platelets [[id number]] Collected:  02/23/15 1645    Specimen Information:  Blood Updated:  02/24/15 0042     Plateletpheresis Platelets      BLUNIT [id number]      Status transfused      PRODUCT CODE (NON READABLE) E3077V00      Expiration Date [id number]      UTYPE AB POS     Prepare RBC: one unit RBC [[id number]] Collected:  02/22/15 1543     RBC Leukoreduced RBC Leukoreduced Updated:  02/24/15 0042     BLUNIT [id number]      Status transfused      PRODUCT CODE  (NON READABLE) E0226V00      Expiration Date [id number]      UTYPE O NEG     Prepare RBC: one unit RBC [[id number]] Collected:  02/22/15 1543     RBC Leukoreduced RBC Leukoreduced Updated:  02/24/15 0042     BLUNIT [id number]      Status transfused      PRODUCT CODE (NON READABLE) E0226V00      Expiration Date [id number]      UTYPE O NEG     CBC [[id number]]  (Abnormal) Collected:  02/23/15 1535    Specimen Information:  Blood from Blood Updated:  02/23/15 1630     WBC 7.05 x10 3/uL      Hgb 7.2 (L) g/dL      Hematocrit 73.5 (L) %      Platelets 39 (L) x10 3/uL      RBC 2.16 (L) x10 6/uL      MCV 92.6 fL      MCH 33.3 (H) pg      MCHC 36.0 g/dL      RDW 22 (H) %      MPV 11.8 fL      Nucleated RBC 0 /100 WBC     Narrative:      Please check with next CBC and lactate check    Phosphorus [id number] Collected:  02/23/15 1535    Specimen Information:  Blood Updated:  02/23/15 1618     Phosphorus 3.8 mg/dL     Magnesium [[id number]] Collected:  02/23/15 1535    Specimen Information:  Blood Updated:  02/23/15 1618     Magnesium 1.8 mg/dL  Comprehensive metabolic panel [[id number]]  (Abnormal) Collected:  02/23/15 1535    Specimen Information:  Blood Updated:  02/23/15 1616     Glucose 95 mg/dL      BUN 40.9 mg/dL      Creatinine 1.4 (H) mg/dL      Sodium 811 mEq/L      Potassium 3.7 mEq/L      Chloride 107 mEq/L      CO2 20 (L) mEq/L      Calcium 7.7 (L) mg/dL      Protein, Total 6.7 g/dL      Albumin 2.2 (L) g/dL      AST (SGOT) 914 (H) U/L      ALT 29 U/L      Alkaline Phosphatase 121 (H) U/L      Bilirubin, Total 8.5 (H) mg/dL      Globulin 4.5 (H) g/dL      Albumin/Globulin Ratio 0.5 (L)     Narrative:      Please check with next CBC and lactate check    GFR [id number] Collected:  02/23/15 1535     EGFR 56.1 Updated:  02/23/15 1616    Narrative:      Please check with next CBC and lactate check    Hepatic function panel (LFT) [id number]  (Abnormal) Collected:  02/23/15 1535    Specimen Information:  Blood  Updated:  02/23/15 1616     Bilirubin, Direct 5.0 (H) mg/dL      Bilirubin, Indirect 3.5 (H) mg/dL     Narrative:      Please check with next CBC and lactate check    Prothrombin time/INR [id number]  (Abnormal) Collected:  02/23/15 1535    Specimen Information:  Blood Updated:  02/23/15 1606     PT 23.3 (H) sec      PT INR 2.1 (H)      PT Anticoag. Given Within 48 hrs. None     Narrative:      Please check with next CBC and lactate check    Lactic acid, plasma [id number] Collected:  02/23/15 1535    Specimen Information:  Blood Updated:  02/23/15 1558     Lactic acid 2.0 mmol/L     CBC [[id number]]  (Abnormal) Collected:  02/23/15 0825    Specimen Information:  Blood from Blood Updated:  02/23/15 0936     WBC 7.27 x10 3/uL      Hgb 7.4 (L) g/dL      Hematocrit 08.6 (L) %      Platelets 41 (L) x10 3/uL      RBC 2.29 (L) x10 6/uL      MCV 91.7 fL      MCH 32.3 (H) pg      MCHC 35.2 g/dL      RDW 21 (H) %      MPV 11.0 fL      Nucleated RBC 0 /100 WBC     Lactic acid, plasma [[id number]]  (Abnormal) Collected:  02/23/15 0825    Specimen Information:  Blood Updated:  02/23/15 0835     Lactic acid 2.3 (H) mmol/L     CBC and differential [[id number]]  (Abnormal) Collected:  02/23/15 0024    Specimen Information:  Blood from Blood Updated:  02/23/15 0143     WBC 6.94 x10 3/uL      Hgb 6.5 (L) g/dL      Hematocrit 52.8 (L) %      Platelets 47 (L) x10 3/uL  RBC 1.94 (L) x10 6/uL      MCV 92.8 fL      MCH 33.5 (H) pg      MCHC 36.1 (H) g/dL      RDW 22 (H) %      MPV 11.6 fL      Neutrophils 60 %      Lymphocytes Automated 23 %      Monocytes 16 %      Eosinophils Automated 1 %      Basophils Automated 0 %      Immature Granulocyte 0 %      Nucleated RBC 0 /100 WBC      Neutrophils Absolute 4.17 x10 3/uL      Abs Lymph Automated 1.58 x10 3/uL      Abs Mono Automated 1.12 x10 3/uL      Abs Eos Automated 0.04 x10 3/uL      Absolute Baso Automated 0.01 x10 3/uL      Absolute Immature Granulocyte 0.02 x10 3/uL     GFR  [[id number]] Collected:  02/23/15 0039     EGFR 56.1 Updated:  02/23/15 0113    Comprehensive metabolic panel [[id number]]  (Abnormal) Collected:  02/23/15 0039    Specimen Information:  Blood Updated:  02/23/15 0113     Glucose 96 mg/dL      BUN 78.2 mg/dL      Creatinine 1.4 (H) mg/dL      Sodium 956 (L) mEq/L      Potassium 4.4 mEq/L      Chloride 107 mEq/L      CO2 19 (L) mEq/L      Calcium 7.1 (L) mg/dL      Protein, Total 6.8 g/dL      Albumin 2.0 (L) g/dL      AST (SGOT) 213 (H) U/L      ALT 29 U/L      Alkaline Phosphatase 130 (H) U/L      Bilirubin, Total 7.5 (H) mg/dL      Globulin 4.8 (H) g/dL      Albumin/Globulin Ratio 0.4 (L)     Lactic acid, plasma [[id number]]  (Abnormal) Collected:  02/23/15 0024    Specimen Information:  Blood Updated:  02/23/15 0057     Lactic acid 3.0 (H) mmol/L     Prepare RBC: one unit RBC [[id number]] Collected:  02/22/15 1543     RBC Leukoreduced RBC Leukoreduced Updated:  02/23/15 0042     BLUNIT [id number]      Status transfused      PRODUCT CODE (NON READABLE) E0226V00      Expiration Date [id number]      Wetzel Bjornstad NEG     T3, free [[id number]] Collected:  02/22/15 1536    Specimen Information:  Blood Updated:  02/22/15 2128     T3, Free 1.71 pg/mL     Ammonia [[id number]] Collected:  02/22/15 2049    Specimen Information:  Blood Updated:  02/22/15 2121     Ammonia 37 umol/L     Narrative:      Starting For 1 Occurrences  Drug Screen, Urine Random    CBC with Differential [[id number]]  (Abnormal) Collected:  02/22/15 1931    Specimen Information:  Blood from Blood Updated:  02/22/15 1947     WBC 9.35 x10 3/uL      Hgb 7.4 (L) g/dL      Hematocrit 93.2 (L) %      Platelets 58 (L) x10 3/uL  RBC 2.19 (L) x10 6/uL      MCV 94.1 fL      MCH 33.8 (H) pg      MCHC 35.9 g/dL      RDW 21 (H) %      MPV 11.2 fL      Neutrophils 66 %      Lymphocytes Automated 15 %      Monocytes 18 %      Eosinophils Automated 0 %      Basophils Automated 0 %      Immature Granulocyte 0 %       Nucleated RBC 0 /100 WBC      Neutrophils Absolute 6.21 x10 3/uL      Abs Lymph Automated 1.43 x10 3/uL      Abs Mono Automated 1.66 (H) x10 3/uL      Abs Eos Automated 0.01 x10 3/uL      Absolute Baso Automated 0.02 x10 3/uL      Absolute Immature Granulocyte 0.02 x10 3/uL     APTT [[id number]]  (Abnormal) Collected:  02/22/15 1836     PTT 51 (H) sec Updated:  02/22/15 1918    Prothrombin time/INR [[id number]]  (Abnormal) Collected:  02/22/15 1836    Specimen Information:  Blood Updated:  02/22/15 1918     PT 24.8 (H) sec      PT INR 2.3 (H)      PT Anticoag. Given Within 48 hrs. None     Lactic acid, plasma [[id number]]  (Abnormal) Collected:  02/22/15 1836    Specimen Information:  Blood Updated:  02/22/15 1909     Lactic acid 4.7 (HH) mmol/L     Ammonia [[id number]] Collected:  02/22/15 1836    Specimen Information:  Blood Updated:  02/22/15 1908     Ammonia 31 umol/L     T4, free [[id number]] Collected:  02/22/15 1536    Specimen Information:  Blood Updated:  02/22/15 1858     T4 Free 1.06 ng/dL     Bilirubin, direct [[id number]]  (Abnormal) Collected:  02/22/15 1536    Specimen Information:  Blood Updated:  02/22/15 1837     Bilirubin, Direct 5.3 (H) mg/dL     Lipase [[id number]] Collected:  02/22/15 1536     Lipase 66 U/L Updated:  02/22/15 1837    Type and Screen [[id number]] Collected:  02/22/15 1543    Specimen Information:  Blood Updated:  02/22/15 1716     ABO Rh O POS      AB Screen Gel NEG     Narrative:      ?Number of units->1  ?Special Requirements->No special requirements  ?Transfusion Criteria->Hgb <7g/dL or Hct <53% in symptomatic,  ?hemodynamically stable patient  ?Has the patient been transfused w/i the last 3  ?months?->Unknown    Manual Differential [[id number]]  (Abnormal) Collected:  02/22/15 1536     Segmented Neutrophils 88 % Updated:  02/22/15 1713     Band Neutrophils 1 %      Lymphocytes Manual 7 %      Monocytes Manual 4 %      Eosinophils Manual 0 %      Basophils Manual 0 %      Nucleated  RBC 0 /100 WBC      Abs Seg Manual 8.40 (H) x10 3/uL      Bands Absolute 0.10 x10 3/uL      Absolute Lymph Manual 0.67 x10 3/uL      Monocytes Absolute 0.38  x10 3/uL      Absolute Eos Manual 0.00 x10 3/uL      Absolute Baso Manual 0.00 x10 3/uL     Cell MorpHology [[id number]]  (Abnormal) Collected:  02/22/15 1536     Cell Morphology: Abnormal (A) Updated:  02/22/15 1713     Macrocytic =2+ (A)      Target Cells =1+ (A)      Ovalocytes =1+ (A)      Burr Cells =1+ (A)      Rouleaux =1+ (A)      Tear Drop Cells =1+ (A)      Platelet Estimate Decreased (A)     TSH [[id number]]  (Abnormal) Collected:  02/22/15 1536     Thyroid Stimulating Hormone 6.70 (H) uIU/mL Updated:  02/22/15 1652    Comprehensive metabolic panel [[id number]]  (Abnormal) Collected:  02/22/15 1536    Specimen Information:  Blood Updated:  02/22/15 1633     Glucose 121 (H) mg/dL      BUN 9.0 mg/dL      Creatinine 1.4 (H) mg/dL      Sodium 086 (L) mEq/L      Potassium 4.4 mEq/L      Chloride 104 mEq/L      CO2 17 (L) mEq/L      Calcium 7.8 (L) mg/dL      Protein, Total 7.4 g/dL      Albumin 2.2 (L) g/dL      AST (SGOT) 578 (H) U/L      ALT 33 U/L      Alkaline Phosphatase 142 (H) U/L      Bilirubin, Total 7.7 (H) mg/dL      Globulin 5.2 (H) g/dL      Albumin/Globulin Ratio 0.4 (L)     Magnesium [[id number]] Collected:  02/22/15 1536    Specimen Information:  Blood Updated:  02/22/15 1633     Magnesium 1.6 mg/dL     GFR [[id number]] Collected:  02/22/15 1536     EGFR 56.1 Updated:  02/22/15 1633    Ethanol (Alcohol) Level [[id number]]  (Abnormal) Collected:  02/22/15 1536     Alcohol 70 (H) mg/dL Updated:  03/47/42 5956    Acetaminophen level [[id number]]  (Abnormal) Collected:  02/22/15 1536     Acetaminophen Level <6 (L) ug/mL Updated:  02/22/15 1633    Salicylate level [[id number]]  (Abnormal) Collected:  02/22/15 1536     Salicylate Level <5.0 (L) mg/dL Updated:  03/19/15 0109    Salicylate level [[id number]] Collected:  02/22/15 1543    Specimen  Information:  Blood Updated:  02/22/15 1544    Ethanol (Alcohol) Level [[id number]] Collected:  02/22/15 1543    Specimen Information:  Blood Updated:  02/22/15 1544    Narrative:      No alcohol for draw    TSH [[id number]] Collected:  02/22/15 1543    Specimen Information:  Blood Updated:  02/22/15 1544    Acetaminophen Level [[id number]] Collected:  02/22/15 1543    Specimen Information:  Blood Updated:  02/22/15 1544            ALCOHOL USE DISORDER CHECKLIST:     1.Alcohol is often taken in larger amounts or over a longer period than was intended? yes    2.There is a persistent desire or unsuccessful efforts to cut down or control alcohol use? ues    3. A great deal of time is spent in activities necessary to obtain alcohol, use alcohol, or  recover from its effects? yes    4. Craving, or a strong desire or urge to use alcohol? yes    5.Recurrent alcohol use resulting in a failure to fulfill major role obligations at work, school, or home? yes    6.Continued alcohol use despite having persistent or recurrent social or interpersonal  problems caused or exacerbated by the effects of alcohol?    7. Important social, occupational, or recreational activities are given up or reduced because of alcohol use?    8. Recurrent alcohol use in situations in which it is physically hazardous? yes    9. Alcohol use is continued despite knowledge of having a persistent or recurrent physical or psychological problem that is likely to have been caused or exacerbated by alcohol?    10. Tolerance, as defined by either of the following: a) A need for markedly increased amounts of alcohol to achieve intoxication or desired effect?  b) A markedly diminished effect with continued use of the same amount of alcohol?     11. Withdrawal, as manifested by either of the following: a) The characteristic withdrawal syndrome for alcohol (refer to criteria A and B of the criteria set for alcohol withdrawal) yes b) Alcohol (or a closely related  substance, such as a benzodiazepine) is taken to relieve or avoid withdrawal symptoms. yes      [place] [employer]Y OF ADDICTION MEDICINE 6 DIMENSIONS OF ASSESSMENT:   DIMENSION I- INTOXICATION/ WITHDRAWAL POTENTIAL:  Low risk for ETOH withdrawal symptoms, due to >72 hours since last        drink (on 02/21/15 at wedding).    DIMENSION II- BIOMEDICAL COMPLICATIONS:  jaundice and emesis, flank pain   DIMENSION III- PSYCHIATRIC COMPLICATIONS   denies   DIMENSION IV-MOTIVATION TO CHANGE:  contemplation stage   DIMENSION V- RELAPSE POTENTIAL  HIGH   DIMENSION VI- RECOVERY ENVIRONMENT patient has experience with [facility], however states difficulty with transportation.     ASSESSMENT & RECOMMENDATIONS:   Alcohol Abuse Disorder, severe. Patient is currently in contemplation stage. People in the contemplation stage have become aware of problems associated with their behavior. However, they are ambivalent about whether or not it is worthwhile to change. Characteristics of this stage are: exploring the potential to change; desiring change but lacking the confidence and commitment to change behavior; and having the intention to change at some unspecified time in the future. We might describe this person as aware and open to change.    Between stage 2 and 3: A decision is made. People conclude that the negatives of their behavior outweigh the positives. They choose to change their behavior. They make a commitment to change. This decision represents an event, not a process    Discussed treatment options with patient. Recommend patient follow up with [facility] and can be reached at [phone]. Also, discussed IPAC (at times uninsured patients are accepted to [facility]). IPAC resources left in patient chart.     Signed by:  Camelia Eng, MD   Date/Time: 02/25/2015 / 9:41 AM     I saw and evaluated the patient.  I performed the critical portion of the service (history, mental status exam and medical decision  making).  I agree with the findings and plan of care as documented in the resident's note

## 2015-02-25 NOTE — Progress Notes (Addendum)
Orthopedic Trauma Daily Progress Note    02/25/2015   5:13 AM    Gregory [name] is a 39 y.o. male     S/p   fall from ladder with left thigh/gluteal hematoma    Subjective: NAEON. Serial exams to evaluate for compartment syndrome stable with no change in neurovascular status. Thigh softer compared to last night exam. Repeat INR pending. Patient Denies nausea, vomiting, or fevers.     Physical Exam:  Filed Vitals:    02/25/15 0409   BP: 132/84   Pulse: 114   Temp: 99 F (37.2 C)   Resp: 22   SpO2: 99%        Intake/Output Summary (Last 24 hours) at 02/25/15 0513  Last data filed at 02/25/15 0409   Gross per 24 hour   Intake 1169.58 ml   Output    300 ml   Net 869.58 ml       Left Lower Extremity:   skin intact with large hematoma. Compartments remains compressible, tender.                                                                                       +PF/DF/EHL                                                                                                                       Wiggles toes well       Passive ROM-+flexion/extension/int rotation/ext rotation stable from previous exams.                                                                                                           SILT to Toes  2+ DP/PT  Cap refill <2 secs          Assessment: s/p   fall from ladder with left thigh/gluteal hematoma    Plan:   Mobility: Out of bed as tolerated with PT/OT   Pain control: Continue to wean/titrate to appropriate oral regimen   DVT Prophylaxis (per Ortho Trauma service Protocol): DVT prophylaxis is not indicated from an Orthopaedic perspective   Foley catheter status: Does not have Foley   Further surgical plans: No further Orthopaedic plans unless develops compartment syndrome    RUE: WBAT   LUE:  WBAT   RLE:  WBAT   LLE:  WBAT   Disposition: Planning for D/C home vs rehab when  stable    Pollie Meyer PGY-1  Department of Orthopedics  Page [phone] or call [phone] for questions        Attending Addendum/Attestation:  I have personally seen and examined this patient and have participated in their care. I agree with the clinical information, including the physical exam, patient history, and planning as documented above/below. In addition, I have edited this note to reflect my findings and plan as well as to incorporate any new data.    Patient quite comfortable  No pain w/stretch/stress of Gluteus maximus    Shawnie Pons MD

## 2015-02-25 NOTE — Progress Notes (Signed)
Attending Attestation:     I have seen and personally examined the patient.  I agree with the findings and exam as documented by Dr. Tedra Coupe.    Plan:  Anemia.  Continue to monitor CBC and transfuse if needed for hemoglobin  below 7.  Left thigh hematoma after trauma.  We will continue to monitor  CBC and attempts to reverse INR, Ortho team is on board for  monitoring for compartment syndrome.  Transaminitis with history of  cirrhosis due to alcohol.  We will continue to monitor liver function.   Avoid hepatotoxins.  Alcohol withdrawal.  Continue with CIWA protocol with  Ativan.  Not a candidate for lithium due to severe inflammation and  cirrhosis due to alcohol.  Altered mental status, likely secondary to  medication.  Medications are adjusted.  Sinus tach, multifactorial.   Continue to monitor on tele.  Thrombocytopenia, likely secondary to liver  dysfunction.  We will continue to monitor platelet.  GI and DVT prophylaxis  as needed.     Case discussed with the nursing staff.          Disposition:     Today's date: 02/25/2015  Admit Date: 01/23/2015  6:18 AM  Anticipated medical stability for discharge:Red - not tomorrow - estimated month/date: 03-01-2015  Service status: Inpatient: risk of morbidity and mortality, risk of progressive disease and risk of readmission  Reason for ongoing hospitalization: dt  Anticipated discharge needs: tbd     Lucilla Edin, MD

## 2015-02-25 NOTE — Student Progress (Signed)
MEDICINE STUDENT PROGRESS NOTE    Date Time: 02/25/2015 6:22 AM  Patient Name: Gregory [name]  Attending Physician: Lucilla Edin, MD    Subjective   CC: liver failure    Subjective: Per report, the patient was agitated overnight and received 0.5mg  haldol and gabapentin. Patient received 1 dose of tramadol in the past 24 hours of pain control. He has received 24 mg Atrivan in past 24 hours. Patient had 1 BM yesterday and 8 loose BMs overnight. Patient has a Comptroller.     Review of Systems:   Denies n/v/sob  Report left thigh pain    Physical Exam:     VITAL SIGNS PHYSICAL EXAM   Temp:  [97.8 F (36.6 C)-99.1 F (37.3 C)] 99 F (37.2 C)  Heart Rate:  [109-218] 114  Resp Rate:  [0-28] 22  BP: (106-160)/(69-94) 132/84 mmHg  Blood Glucose: 87    Telemetry:       Intake/Output Summary (Last 24 hours) at 02/25/15 0622  Last data filed at 02/25/15 0409   Gross per 24 hour   Intake 1169.58 ml   Output    300 ml   Net 869.58 ml    General: NAD  Neuro: A+Ox3, follows commands  Lungs: CTAB, good air movement  Cardiovascular: Tachycardic, normal S1/S2 without murmurs, rubs, or gallops  Abdomen: Soft, non-tender, non-distended  Extremities: Resting tremor, left thigh hematoma 22 cm x 13 cm, no left palpable pulse, pulse detectable via doppler, left foot cold to touch  Skin: Jaundice  Other: blood on lips and front teeth       Lines (list foley, central lines, picc lines, and date on insertion):   PIV x2, placed 6/5    Meds (essential medications - list Abx days, group by class):     Scheduled Meds:  Current Facility-Administered Medications   Medication Dose Route Frequency   . cefTRIAXone  1 g Intravenous Q24H SCH   . folic acid  1 mg Oral Daily   . lactulose  30 g Oral Q4H SCH   . multivitamin  1 tablet Oral Daily   . pantoprazole  40 mg Intravenous BID   . rifaximin  550 mg Oral BID   . thiamine  100 mg Oral Daily     Continuous Infusions:     PRN Meds:.sodium chloride, acetaminophen, LORazepam, naloxone, ondansetron  **OR** ondansetron, traMADol      Labs:     Labs (last 72 hours): (ensure neat format, include any other labs not listed below)       Recent Labs  Lab 02/24/15  2117 02/24/15  1220   WBC 7.56 6.14   HGB 7.9* 8.3*   HEMATOCRIT 21.6* 23.4*   PLATELETS 62* 65*         Recent Labs  Lab 02/24/15  0129 02/23/15  1535 02/22/15  1836   PT 22.1* 23.3* 24.8*   PT INR 2.0* 2.1* 2.3*   PTT  --   --  51*      Recent Labs  Lab 02/24/15  0128 02/23/15  1535   SODIUM 135* 136   POTASSIUM 3.5 3.7   CHLORIDE 107 107   CO2 20* 20*   BUN 13.0 13.0   CREATININE 1.2 1.4*   CALCIUM 7.8* 7.7*   ALBUMIN 2.2* 2.2*   PROTEIN, TOTAL 6.6 6.7   BILIRUBIN, TOTAL 9.2* 8.5*   ALKALINE PHOSPHATASE 117* 121*   ALT 28 29   AST (SGOT) 178* 204*   GLUCOSE 90 95  Microbiology (update daily)  MRSA swab (6/6): negative    Imaging (summarize)  CXR (6/7): Basal opacities and left pleural effusion increased.  RUQ [place] (6/6): 1. The liver demonstrates a coarsened echotexture with a mildly nodular  contour. There is no intrahepatic biliary ductal dilatation or mass. 2. A moderate amount of mobile sludge is identified within a distended gallbladder. There is no gallstone. The gallbladder wall is edematous and may be partially due to the surrounding ascites. If there is high clinical concern of acute cholecystitis a HIDA scan is recommended. 3. There is no hydronephrosis within the right kidney. There is a 1.0 cm benign appearing upper pole cyst. 4. Ascites. The remainder is as above.  CT (6/5): 1. No renal or ureteral calculi or hydronephrosis. 2. Normal lumbar spine. 3. Left pelvic and hip intramuscular hematoma. 4. Cirrhotic appearing liver with abdominal varices and moderate to large ascites. 5. Left lower lobe atelectasis and moderate pleural effusion.   CXR (6/5): 1. Left basilar airspace disease and right basilar atelectasis. 2. Left pleural fluid/thickening.    Assessment (brief and succinct one liner with diagnoses, not symptoms):     Mr.  Mccabe is a 39 year old male with PMH of alcohol abuse and alcoholic cirrhosis who presents with AMS, nausea, vomiting, anemia, and concern for cholecystitis on imaging.     Plan (by active issues, prioritized.  Can just list the inactive issues):   # Left thigh hematoma  - S/p mechanical fall last weekend  - CT at outside hospital showed left hip intramuscular hematoma  - Left pedal pulse not palpable, but detectable by doppler  - Large left thigh hematoma possibly cause of anemia  - Continue tramadol PRN  - Per ortho: continue to monitor for signs of compartment syndrome    # Anemia, likely acute blood loss  - Recent fall with large left sided hip hematoma - could be the source of anemia but cannot rule out GI etiology  - MCV 92.8, not suggestive of megaloblastic anemia  - No evidence of hematemesis and no report of hematemesis. No documented history of varices available  - Hgb 6.5 on arrival  - Hgb stable at 7.9, goal Hgb is 8  - Every 8 hour CBCs, transfuse as needed  - Continue Protonix IV twice daily  - Follow-up GI recs    # Liver failure likely secondary to alcoholic cirrhosis  - CT at OSH showed cirrhotic appearing liver with abdominal varices and moderate to large ascites  - AST and AST elevated in alcoholic pattern, stable (AST/ALT: 178/29)  - INR trending down 6/8 level pending, goal INR is 1.7  - ? DIC, fibrinogen markers are pending  - Ceruloplasmin: 15. Falsely low ceruloplasmin levels may be observed in any protein deficiency state.  - HAV IgM, HBV and HCV were negative  - ANA was negative  - AFP: normal (7.1)  - Follow up AMA, [facility]  - Meld score 25, 19.6% estimated 65-month mortality  - Titrate lactulose to 2-3 BMs daily  - Continue rifaximin  - Start vitamin K  - Follow up GI recs    # Ascites  - Abdomen is not distended  - Procedure team to re-evaluate this morning  - Per procedure team: INR should be <2    # Pleural effusion  - CXR 6/7: Basal opacities and left pleural effusion increased.  -  Received 20 mg furosemide yesterday. Continue furosemide.   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 EtOH withdrawal vs. Pain meds  - Continue neuro checks q4 hrs  - Continue sitter  - Continue haldol PRN    # Thrombocytopenia likely secondary to cirrhosis  - Platelets trending up: 64 on presentation, now 48  - Differential: secondary to cirrhosis vs. dillutional  - Continue CBCs q 8 hrs    # Nausea and vomiting, could be due to colitis found on CT scan vs. Cholecystitis, resolved   - RUQ [place] (6/5): moderate mobile sludge in a distended gallbladder.   - Protonix as above  - Zofran as needed  - Follow up GI recs    # Metabolic acidosis with elevated lactate, resolved  - Expect some level of lactic acidosis given liver failure  - Lactic acid is trending down with IV fluids 4.7 --> 3.0 --> 2.3 --> 2.0    # Hyperbilirubinemia likely secondary to alcoholic cirrhosis vs. Hematoma resorption  - TBili elevated to 11.2 with indirect elevated to 5.1      # Tremor  - Differential diagnosis: asterixis due to hepatic encephalopathy vs. Acute EtOH withdrawal  - No asterixis on exam, likely due to EtOH withdrawal        DVT prophylaxis: SCDs  FEN/GI: Clear liquid diet, replete electrolytes as needed  Code status: full code    Disposition (what needs to be accomplished before the patient returns home):   Anemia stabilized  Home with home health PT or home with supervision (TBA further)    Signed by: Dewitt Rota, BS

## 2015-02-25 NOTE — Progress Notes (Signed)
Ortho Trauma Progress Note    Patient seen at bedside. Compartment remains compressible. Passive flexion and extension continues to remain stable compared to earlier exams. Wiggles toes/moves ankle. SILT. Pain controlled. Will continue to monitor    Gregory [name] PGY-1  Department of Orthopedics  Page [phone] or call [phone] for questions

## 2015-02-25 NOTE — Progress Notes (Signed)
Pt AxOx2. FC. MAE. To self and sometimes place or time. Pt complains of 6-7/10 pain in L Hip and Thigh. Pt stated he's okay. x9 BM from Lactulose. Held rest of lactulose med for the night. Pt constantly tries to get OOB. MD Vanice Sarah saw pt. PRN haldol ordered and gabapenin with moderate effect. PRN Ativan 4mg  x2 with moderate effect. Sitter on standby to reorient pt. x2 incontinent of urine. x1 RBC with no transfusion reaction. Pt is resting at this time. Will continue to monitor. Safety maintained.

## 2015-02-26 ENCOUNTER — Inpatient Hospital Stay: Payer: Charity

## 2015-02-26 LAB — CBC
Hematocrit: 20.3 % — ABNORMAL LOW (ref 42.0–52.0)
Hgb: 7.2 g/dL — ABNORMAL LOW (ref 13.0–17.0)
MCH: 33 pg — ABNORMAL HIGH (ref 28.0–32.0)
MCHC: 35.5 g/dL (ref 32.0–36.0)
MCV: 93.1 fL (ref 80.0–100.0)
MPV: 11.2 fL (ref 9.4–12.3)
Nucleated RBC: 0 /100 WBC (ref 0–1)
Platelets: 60 10*3/uL — ABNORMAL LOW (ref 140–400)
RBC: 2.18 10*6/uL — ABNORMAL LOW (ref 4.70–6.00)
RDW: 22 % — ABNORMAL HIGH (ref 12–15)
WBC: 8.64 10*3/uL (ref 3.50–10.80)

## 2015-02-26 LAB — BASIC METABOLIC PANEL
BUN: 18 mg/dL (ref 9.0–28.0)
CO2: 20 mEq/L — ABNORMAL LOW (ref 22–29)
Calcium: 7.9 mg/dL — ABNORMAL LOW (ref 8.5–10.5)
Chloride: 108 mEq/L (ref 100–111)
Creatinine: 1.1 mg/dL (ref 0.7–1.3)
Glucose: 104 mg/dL — ABNORMAL HIGH (ref 70–100)
Potassium: 3.5 mEq/L (ref 3.5–5.1)
Sodium: 138 mEq/L (ref 136–145)

## 2015-02-26 LAB — PREPARE RBC
Expiration Date: 201606302359
Status: TRANSFUSED
UTYPE: O POS

## 2015-02-26 LAB — TYPE AND SCREEN
AB Screen Gel: NEGATIVE
ABO Rh: O POS

## 2015-02-26 LAB — HEPATIC FUNCTION PANEL
ALT: 29 U/L (ref 0–55)
AST (SGOT): 155 U/L — ABNORMAL HIGH (ref 5–34)
Albumin/Globulin Ratio: 0.5 — ABNORMAL LOW (ref 0.9–2.2)
Albumin: 2.3 g/dL — ABNORMAL LOW (ref 3.5–5.0)
Alkaline Phosphatase: 117 U/L — ABNORMAL HIGH (ref 38–106)
Bilirubin Direct: 6.7 mg/dL — ABNORMAL HIGH (ref 0.0–0.5)
Bilirubin Indirect: 5.5 mg/dL — ABNORMAL HIGH (ref 0.0–1.1)
Bilirubin, Total: 12.2 mg/dL — ABNORMAL HIGH (ref 0.2–1.2)
Globulin: 4.7 g/dL — ABNORMAL HIGH (ref 2.0–3.6)
Protein, Total: 7 g/dL (ref 6.0–8.3)

## 2015-02-26 LAB — GFR: EGFR: >60

## 2015-02-26 LAB — PT/INR
PT INR: 2.4 — ABNORMAL HIGH (ref 0.9–1.1)
PT: 25.4 s — ABNORMAL HIGH (ref 12.6–15.0)

## 2015-02-26 MED ORDER — SODIUM CHLORIDE 0.9 % IV SOLN
INTRAVENOUS | Status: DC | PRN
Start: 2015-02-26 — End: 2015-03-03

## 2015-02-26 MED ORDER — SODIUM CHLORIDE 0.9 % IV SOLN
INTRAVENOUS | Status: DC | PRN
Start: 2015-02-26 — End: 2015-02-27

## 2015-02-26 MED ORDER — PHYTONADIONE 5 MG PO TABS
5.0000 mg | ORAL_TABLET | Freq: Once | ORAL | Status: AC
Start: 2015-02-26 — End: 2015-02-26
  Administered 2015-02-26: 5 mg via ORAL
  Filled 2015-02-26: qty 1

## 2015-02-26 MED ORDER — FUROSEMIDE 10 MG/ML IJ SOLN
20.0000 mg | Freq: Once | INTRAMUSCULAR | Status: AC
Start: 2015-02-26 — End: 2015-02-26
  Administered 2015-02-26: 20 mg via INTRAVENOUS
  Filled 2015-02-26: qty 4

## 2015-02-26 NOTE — Progress Notes (Signed)
Attending Attestation:     I have seen and personally examined the patient.  I agree with the findings and exam as documented by Dr. Tedra Coupe.    Plan:  Anemia, secondary to left thigh hematoma due to trauma.  We will continue  to monitor CBC and transfuse as needed.       Left thigh hematoma.  Orthopedic team is on board for monitoring for  compartment syndrome.  Continue with supportive care.  We will continue to  monitor CBC.       History of alcohol abuse with alcohol withdrawal.  Continue with Ativan.       History of alcoholic cirrhosis with ascites due to elevated INR.  The  patient is not a candidate for paracentesis.  Once INR is 2 or below, we  will consider a paracentesis with the help of [facility] team.  Alcoholic  cirrhosis.  Continue with supportive care.     Altered mental status, likely secondary to medication, has improved.  We  will advance the diet.       Case discussed with the patient in detail.     Time spent is 35 minutes, greater than 50% in counseling and coordination  of care.          Disposition:     Today's date: 02/26/2015  Admit Date: 01/23/2015  6:18 AM  Anticipated medical stability for discharge:Red - not tomorrow - estimated month/date: 03-04-2015  Service status: Inpatient: risk of morbidity and mortality, risk of progressive disease and risk of readmission  Reason for ongoing hospitalization: DT  Anticipated discharge needs: tbd     Lucilla Edin, MD

## 2015-02-26 NOTE — Progress Notes (Signed)
[facility]  PROGRESS NOTE  [facility] call: [phone], [phone]  [facility] call: [phone]  After hours call [phone]    Date Time: 02/26/2015 6:24 PM  Patient Name: Gregory [name], Gregory [name]      Chief Complaint:   Anemia, cirrhosis     Assessment and Plan:   Assessment: 39yoM with etoh abuse presented to [facility] ED with abdominal pain, recent fall and found to have anemia and jaundice. CT with large left hip hematoma, ascites.   1. Anemia in setting of large left hip hematoma, likely main source. No report of overt GI bleeding at this time. H/H stable.   2. Abnormal LFTs, suspect alcoholic hepatitis and underlying known cirrhosis.DF is 55. MELD 27. LFTs improving. Hepatitis BsAg, Hep C Ab neg, Ceruloplasmin 15, iron/TIBC normal; ferritin 1988. AFP 7.1. ANA negative  3. Alcohol abuse    Plan:   1. Recommend diagnostic paracentesis and please check cell count, gram stain, culture, cytology, and albumin  2. Obtain infectious work-up (i.e. UA/UCx, BCxx2, CXR). If negative, we may consider prednisolone; however, do not recommend prednisolone before a negative infectious work-up is obtained  3. PPI  4. Follow H&H and continue to monitor for overt GI bleeding. 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 Follow LFTs/INR daily  6. CIWA protocol/[facility]  7. Lactulose TID, titrate to 2-3 soft stools daily  8.Xifaxan 550mg  BID  9. Follow up with AMA, ASMA  10. Encourage nutrition -- calorie count.Nutrition following     Subjective:   No acute events. Pt resting comfortably. No melena, hematochezia, or abdominal pain.    Medications:     Current Facility-Administered Medications   Medication Dose Route Frequency   . folic acid  1 mg Oral Daily   . lactulose  30 g Oral Q6H   . multivitamin  1 tablet Oral Daily   . pantoprazole  40 mg Intravenous BID   . rifaximin  550 mg Oral BID   . thiamine  100 mg Oral Daily       Review of Systems:     Complete 12 Point ROS negative or normal except those mentioned in HPI above.    Physical Exam:     Filed Vitals:    02/26/15 1810   BP: 127/78   Pulse: 121   Temp: 100 F (37.8 C)   Resp:    SpO2: 96%   BP 127/78 mmHg  Pulse 121  Temp(Src) 100 F (37.8 C) (Axillary)  Resp 20  Ht 1.93 m (6\' 4" )  Wt 81.647 kg (180 lb)  BMI 21.92 kg/m2  SpO2 96%    General appearance: Well developed, well nourished, appears stated age and in NAD  Eyes: Sclera anicteric, pink conjunctivae, no ptosis  ENMT: mucous membranes moist, nose and ears appear normal.  Oropharynx clear.  Chest: Non labored respirations, no audible wheezing, no clubbing or cyanosis  CV:  Regular rate and rhythm, no JVD, no LE edema  Abdomen: soft, non-tender throughout, no masses or organomegaly  Skin: Normal color and turgor, no rashes, no suspicious skin lesions noted  Neuro: CN II-XII grossly intact.  No gross movement disorders noted.  Mental status: Appropriate affect, alert and oriented x 3    Labs:     Recent Labs      02/26/15   1406  02/25/15   2213   WBC  8.64  9.71   HGB  7.2*  8.3*  HEMATOCRIT  20.3*  23.6*   PLATELETS  60*  67*   MCV  93.1  91.8       Recent Labs      02/26/15   0306  02/25/15   0616   SODIUM  138  137   POTASSIUM  3.5  3.4*   CHLORIDE  108  107   CO2  20*  20*   BUN  18.0  14.0   CREATININE  1.1  1.2   GLUCOSE  104*  87   CALCIUM  7.9*  7.9*       Recent Labs      02/26/15   0306  02/25/15   0616   AST (SGOT)  155*  178*   ALT  29  29   ALKALINE PHOSPHATASE  117*  118*   BILIRUBIN, TOTAL  12.2*  11.2*   BILIRUBIN, DIRECT  6.7*  6.1*   PROTEIN, TOTAL  7.0  6.8   ALBUMIN  2.3*  2.2*       Recent Labs      02/26/15   0306  02/25/15   0743   PT  25.4*  23.5*   PT INR  2.4*  2.1*        Radiology:   Radiological Procedure reviewed:    [place] RUQ 02/23/15:   1. The liver demonstrates a coarsened echotexture with a mildly nodular contour. There is no intrahepatic biliary ductal dilatation or mass.  2. A moderate amount of mobile  sludge is identified within a distended gallbladder. There is no gallstone. The gallbladder wall is edematous and may be partially due to the surrounding ascites. If there is high clinical concern of acute cholecystitis a HIDA scan is recommended.  3. There is no hydronephrosis within the right kidney. There is a 1.0 cm benign appearing upper pole cyst.  4. Ascites. The remainder is as above.

## 2015-02-26 NOTE — Progress Note - Problem Oriented Charting Notewrit (Signed)
MEDICINE PROGRESS NOTE    Date Time: 02/26/2015 6:25 AM  Patient Name: Gregory [name]  Attending Physician: Lucilla Edin, MD   Medicine Resident Team A  Spectra: [phone]  Night: [phone]    Attention CONSULTANTS:   This patient is covered by the internal medicine teaching service.   Except in cases of emergency, please contact the medicine intern   or resident first for any questions or to place orders.      Assessment/Plan:   39yo gentleman with history of alcoholic cirrhosis, who was transferred from [facility] ED on 6/5 with depressed mental status and found to have anemia concerning for acute blood loss, as well as ascites and biliary sludge.  Persistent anemia, thrombocytopenia, INR elevation, requiring blood product transfusion.        #Anemia  Recent fall with left sided hip trauma with L thigh hematoma noted on OSH CT likely source of anemia, and hematoma enlarging on exam.  Cannot rule out GI bleed as pt reports taking ibuprofen for thigh pain at home, but no known history of hematemesis/GI bleed.   - Ortho and GI consulted - f/u recs  - 2U FFP and vitamin K given ongoing INR elevation and persistent bleed, goal INR <1.5  - Monitor CBC 8h, transfuse for Hct <22  - Protonix IV 40mg  BID    #LFT elevation and hepatic cirrhosis, likely from EtOH use, with large ascites noted on outside CT  MELD score 26.7.  HepBsAg, HepC Ab neg.  ANA, ASMA, AFP neg.   - Diagnostic and therapeutic paracentesis ordered on admission, but likely not have sufficient ascites to tap per RUQ [place] and will need further reversal of INR before able to proceed  - Repeat abdominal US today to reassess ascites - increased abdominal distension today  - Lactulose, titrate to 2-3 soft stools daily  - Rifaximin 550mg  BID    #L thigh hematoma  Recent fall with left sided hip trauma and L thigh hematoma noted on OSH CT  - Neurovascular checks of LLE  - Low dose tylenol and tramadol for pain     #Active alcohol abuse  - CIWA protocol  - PO  multivitamin, folic acid, and thiamine  - CATS consult    #Altered mental status  Likely from dilaudid and ativan given outside hospital but can rule out hepatic encephalopathy.    - Lactulose and rifaximin as above.  Continue CIWA and avoid sedating meds as much as possible.     #Sinus tachycardia   Likely multi-factorial, from pain, anxiety, EtOH withdrawal.    - Continue CIWA, monitor fluid status      #Thrombocytopenia likely secondary to cirrhosis  - Monitor with daily CBC.  FFP as above.     #Distended gallbladder on outside CT scan  6/6 RUQ US showed mobile sludge seen within a distended gallbladder and edematous gallbladder wall is edematous (which may be partially due to the surrounding ascites), no gallstone.    - Per GI, exam not concerning, hold off on HIDA scan for now    FEN: regular diet  Ppx: SCD for DVT  Code: FULL       Safety Checklist:     DVT prophylaxis:  CHEST guideline (See page [id number]) Mechanical   Foley:  [place] Rn Foley protocol Not present   IVs:  Peripheral IV   PT/OT: Ordered   Daily CBC & or Chem ordered:  SHM/ABIM guidelines (see #5) Yes, due to clinical and lab instability   Reference for  approximate charges of common labs: CBC auto diff - $76  BMP - $99  Mg - $79    Disposition:     Anticipated discharge needs: TBD    Subjective     CC: anemia     24 hour events: Persistently drowsy but arousable. Received ativan 2mg  x1 overnight.     Subjective: awake and alert this morning.  Able to joke around and requesting solid food.     Review of Systems:     As above    Physical Exam:     VITAL SIGNS PHYSICAL EXAM   Temp:  [97.7 F (36.5 C)-100.2 F (37.9 C)] 98.8 F (37.1 C)  Heart Rate:  [111-129] 116  Resp Rate:  [14-29] 17  BP: (99-147)/(56-86) 128/86 mmHg  SpO2: room air  Body mass index is 21.92 kg/(m^2).      Intake/Output Summary (Last 24 hours) at 02/26/15 0625  Last data filed at 02/25/15 1845   Gross per 24 hour   Intake  937.5 ml   Output      0 ml   Net  937.5 ml    GEN: thin  jaundiced ill appearing gentleman, lying in bed with head low and teeth shattering  HEENT: mild gum bleeding  CV: RRR, no murmurs, no JVD  PULM: ctab, no wheezes/crackles  ABDO: soft, non-tender, increased abdominal distention, no guarding/rebound  EXTREMITIES: no lower/upper extremity edema, L lateral thigh with enlarging hematoma and mild swelling and significant tenderness, DP pulses dopplerable bilaterally and warm to touch  NEURO: awake and alert, knows name and [facility]       Meds:      Scheduled Meds: PRN Meds:        folic acid 1 mg Oral Daily   lactulose 30 g Oral Q6H   multivitamin 1 tablet Oral Daily   pantoprazole 40 mg Intravenous BID   rifaximin 550 mg Oral BID   thiamine 100 mg Oral Daily        sodium chloride  PRN   sodium chloride  PRN   acetaminophen 325 mg Q4H PRN   LORazepam 2-4 mg PRN   naloxone 0.2 mg PRN   ondansetron 4 mg Q8H PRN   Or     ondansetron 4 mg Q8H PRN   traMADol 25 mg Q6H PRN           Labs:     Labs (last 72 hours):      Recent Labs  Lab 02/25/15  2213 02/25/15  1400   WBC 9.71 9.22   HGB 8.3* 8.4*   HEMATOCRIT 23.6* 23.8*   PLATELETS 67* 62*         Recent Labs  Lab 02/26/15  0306 02/25/15  0743  02/22/15  1836   PT 25.4* 23.5* More results in Results Review 24.8*   PT INR 2.4* 2.1* More results in Results Review 2.3*   PTT  --   --   --  51*   More results in Results Review = values in this interval not displayed.   Recent Labs  Lab 02/26/15  0306 02/25/15  0616   SODIUM 138 137   POTASSIUM 3.5 3.4*   CHLORIDE 108 107   CO2 20* 20*   BUN 18.0 14.0   CREATININE 1.1 1.2   CALCIUM 7.9* 7.9*   ALBUMIN 2.3* 2.2*   PROTEIN, TOTAL 7.0 6.8   BILIRUBIN, TOTAL 12.2* 11.2*   ALKALINE PHOSPHATASE 117* 118*   ALT 29 29  AST (SGOT) 155* 178*   GLUCOSE 104* 87          Microbiology, reviewed and significant for:  MRSAneg    Imaging, reviewed and significant for:  Xr Lumbar Spine Ap And Lateral    02/17/2015   IMPRESSION: No acute osseous abnormality of the lumbar spine.    ReadingStation:[id number]    US Abdomen Limited Ruq    02/23/2015    1. The liver demonstrates a coarsened echotexture with a mildly nodular contour. There is no intrahepatic biliary ductal dilatation or mass. 2. A moderate amount of mobile sludge is identified within a distended gallbladder. There is no gallstone. The gallbladder wall is edematous and may be partially due to the surrounding ascites. If there is high clinical concern of acute cholecystitis a HIDA scan is recommended. 3. There is no hydronephrosis within the right kidney. There is a 1.0 cm benign appearing upper pole cyst. 4. Ascites. The remainder is as above. Stephannie Peters, MD  02/23/2015 8:36 AM     Xr Chest Ap Portable    02/24/2015    Basal opacities and left pleural effusion increased.  Clide Cliff, MD  02/24/2015 10:00 AM     Xr Chest  Ap Portable    02/22/2015   Impression: 1. Left basilar airspace disease and right basilar atelectasis. 2. Left pleural fluid/thickening.  Pennelope Bracken, MD  02/22/2015 4:17 PM     Xr Pelvis Portable    02/17/2015    No acute osseous abnormality of the pelvis.  ReadingStation:[email or web address]        Discussed with attending Dr. Shaaron Adler, Medicine PGY1 Pager [phone]

## 2015-02-26 NOTE — Plan of Care (Signed)
Pt alert and oriented to person.  Answers are appropriate and follows commands.  Tmax 100.4, ST 110-120, BP 140/60-90.  CIWA 2-4.  Slight anxiety with tremors when awake, but sleeping most of the day.  Lungs clear on RA.  Incontinent x1.  Pulses palpable.  No complaints of pain.  No PRNs given.  Hourly rounding and safety checks done.   Report called to NT10, pt and belongings transported to 1048.

## 2015-02-26 NOTE — Student Progress (Signed)
MEDICINE STUDENT PROGRESS NOTE    Date Time: 02/26/2015 6:15 AM  Patient Name: Gregory [name]  Attending Physician: Gregory Edin, MD    Subjective   CC: liver failure    Subjective: Patient received 1 dose of tramadol in the past 24 hours for pain control. He has received 4 mg Atrivan in past 24 hours. Patient had 2 BMs yesterday and last 2 doses of lactulose were held. Received 2 U FFP yesterday. Patient has a Comptroller.     Review of Systems:   Denies n/v/sob  Report left thigh pain    Physical Exam:     VITAL SIGNS PHYSICAL EXAM   Temp:  [97.7 F (36.5 C)-100.2 F (37.9 C)] 98.8 F (37.1 C)  Heart Rate:  [111-129] 116  Resp Rate:  [14-29] 17  BP: (99-147)/(56-86) 128/86 mmHg  Blood Glucose: 104    Telemetry:       Intake/Output Summary (Last 24 hours) at 02/26/15 0615  Last data filed at 02/25/15 1845   Gross per 24 hour   Intake  937.5 ml   Output      0 ml   Net  937.5 ml    General: NAD  Neuro: A+Ox2, follows commands  Lungs: CTAB, good air movement  Cardiovascular: Tachycardic, normal S1/S2 without murmurs, rubs, or gallops  Abdomen: Non-tender, non-distended, less soft  Extremities: Resting tremor, left thigh hematoma extends from left mid thigh to left flank, no left palpable pulse, pulse detectable via doppler, wiggles toes, skin in left gluteal region is tight, but remains compressible  Skin: Jaundice  Other: blood on lips and front teeth       Lines (list foley, central lines, picc lines, and date on insertion):   PIV x2, placed 6/5    Meds (essential medications - list Abx days, group by class):     Scheduled Meds:  Current Facility-Administered Medications   Medication Dose Route Frequency   . folic acid  1 mg Oral Daily   . lactulose  30 g Oral Q6H   . multivitamin  1 tablet Oral Daily   . pantoprazole  40 mg Intravenous BID   . rifaximin  550 mg Oral BID   . thiamine  100 mg Oral Daily     Continuous Infusions:     PRN Meds:.sodium chloride, sodium chloride, acetaminophen, LORazepam, naloxone,  ondansetron **OR** ondansetron, traMADol      Labs:     Labs (last 72 hours): (ensure neat format, include any other labs not listed below)       Recent Labs  Lab 02/25/15  2213 02/25/15  1400   WBC 9.71 9.22   HGB 8.3* 8.4*   HEMATOCRIT 23.6* 23.8*   PLATELETS 67* 62*         Recent Labs  Lab 02/26/15  0306 02/25/15  0743  02/22/15  1836   PT 25.4* 23.5* More results in Results Review 24.8*   PT INR 2.4* 2.1* More results in Results Review 2.3*   PTT  --   --   --  51*   More results in Results Review = values in this interval not displayed.   Recent Labs  Lab 02/26/15  0306 02/25/15  0616   SODIUM 138 137   POTASSIUM 3.5 3.4*   CHLORIDE 108 107   CO2 20* 20*   BUN 18.0 14.0   CREATININE 1.1 1.2   CALCIUM 7.9* 7.9*   ALBUMIN 2.3* 2.2*   PROTEIN, TOTAL 7.0 6.8  BILIRUBIN, TOTAL 12.2* 11.2*   ALKALINE PHOSPHATASE 117* 118*   ALT 29 29   AST (SGOT) 155* 178*   GLUCOSE 104* 87                  Microbiology (update daily)  MRSA swab (6/6): negative    Imaging (summarize)  CXR (6/7): Basal opacities and left pleural effusion increased.  RUQ [place] (6/6): 1. The liver demonstrates a coarsened echotexture with a mildly nodular  contour. There is no intrahepatic biliary ductal dilatation or mass. 2. A moderate amount of mobile sludge is identified within a distended gallbladder. There is no gallstone. The gallbladder wall is edematous and may be partially due to the surrounding ascites. If there is high clinical concern of acute cholecystitis a HIDA scan is recommended. 3. There is no hydronephrosis within the right kidney. There is a 1.0 cm benign appearing upper pole cyst. 4. Ascites. The remainder is as above.  CT (6/5): 1. No renal or ureteral calculi or hydronephrosis. 2. Normal lumbar spine. 3. Left pelvic and hip intramuscular hematoma. 4. Cirrhotic appearing liver with abdominal varices and moderate to large ascites. 5. Left lower lobe atelectasis and moderate pleural effusion.   CXR (6/5): 1. Left basilar airspace  disease and right basilar atelectasis. 2. Left pleural fluid/thickening.    Assessment (brief and succinct one liner with diagnoses, not symptoms):     Gregory [name] is a 39 year old male with PMH of alcohol abuse and alcoholic cirrhosis who presents with AMS, likely due to hepatic encephalopathy, and large left thigh hematoma.     Plan (by active issues, prioritized.  Can just list the inactive issues):   # Left thigh hematoma  - S/p mechanical fall last weekend  - CT at outside hospital showed left hip intramuscular hematoma  - Left pedal pulse not palpable, but detectable by doppler  - Large left thigh hematoma possibly cause of anemia  - Continue tramadol PRN  - Continue to monitor for signs of compartment syndrome    # Anemia, likely acute blood loss  - Recent fall with large left sided hip hematoma - could be the source of anemia but cannot rule out GI etiology  - MCV 92.8, not suggestive of megaloblastic anemia  - No evidence of hematemesis and no report of hematemesis. No documented history of varices available  - Hgb 6.5 on arrival  - Hgb pending, goal Hgb is 7-8  - Every 8 hour CBCs, transfuse as needed  - Continue Protonix IV twice daily    # Liver failure likely secondary to alcoholic cirrhosis  - CT at OSH showed cirrhotic appearing liver with abdominal varices and moderate to large ascites  - AST and AST elevated in alcoholic pattern, trending down (AST/ALT: 155/29)  - INR increasing to 2.4, goal INR is 1.7  - Ceruloplasmin: 15. Falsely low ceruloplasmin levels may be observed in any protein deficiency state.  - HAV IgM, HBV and HCV were negative  - ANA, AMA: negative  - AFP: normal (7.1)  - Follow up ASMA  - Meld score 27, 19.6% estimated 18-month mortality  - Titrate lactulose to 2-3 BMs daily  - Continue rifaximin  - Start vitamin K  - Continue FFP    # Ascites  - Abdomen is not distended, although less soft than yesterday  - Per procedure team: INR should be <2  - Follow up RUQ [place]    # Pleural  effusion  - CXR 6/7: Basal opacities  and left pleural effusion increased.  - Received 20 mg furosemide yesterday.   - Re-check CXR    # Active alcohol abuse  - Received 1 banana bag  - Continue daily multivitamin, folic acid, and thiamine  - Continue CIWA protocol and provide ativan as needed, now 4 days s/p last EtOH  - Per CATS: Recommend patient follow up with [facility] and can be reached at [phone].     # Tachycardic, mostly 110s-120s  - Likely multifactorial: pain, blood loss into thigh hematoma, and EtOH withdrawal    # Altered mental status  - Differential diagnosis: hepatic encephalopathy vs. Wernike- Korsakoff vs. EtOH withdrawal vs. Pain meds  - Continue neuro checks q4 hrs  - Continue sitter to continue to reorient  - Continue haldol PRN    # Thrombocytopenia likely secondary to cirrhosis  - Platelets trending up: 64 on presentation, now 42  - Differential: secondary to cirrhosis vs. dillutional  - Continue CBCs q 8 hrs    # Hyperbilirubinemia likely secondary to alcoholic cirrhosis vs. Hematoma resorption  - TBili elevated to 12.2 with indirect elevated to 5.5    # Tremor  - Differential diagnosis: asterixis due to hepatic encephalopathy vs. Acute EtOH withdrawal  - No asterixis on exam, likely due to EtOH withdrawal    # AKI, likely hepatorenal syndrome, resolved  - IVF were D/C 2/2 receiving so many blood products    # Nausea and vomiting, could be due to colitis found on CT scan vs. Cholecystitis, resolved   - RUQ [place] (6/5): moderate mobile sludge in a distended gallbladder.   - Protonix as above  - Zofran as needed  - Follow up GI recs    # Metabolic acidosis with elevated lactate, resolved  - Expect some level of lactic acidosis given liver failure  - Lactic acid is trending down with IV fluids 4.7 --> 3.0 --> 2.3 --> 2.0    DVT prophylaxis: SCDs  FEN/GI: Clear liquid diet, replete electrolytes as needed  Code status: full code    Disposition (what needs to be accomplished  before the patient returns home):   Anemia stabilized  Home with home health PT or home with supervision (TBA further)    Signed by: Dewitt Rota, BS

## 2015-02-26 NOTE — Progress Notes (Signed)
Pt tx'd to 10.48 approx 1535. Pt aox3, temp 100 axillary, resident notified, 325mg  tylenol given prior to 1 unit PRBC. Sitter at bedside, pt answers questions appropriately, does not attempt to get oob/remove line. Pt oriented to room, call light teaching done and within reach, side rails up x2, hourly rounding in place.

## 2015-02-26 NOTE — OT Eval Note (Signed)
[facility]   Occupational Therapy Evaluation     Patient: Gregory [name]    MRN#: [id number]   Unit: [facility] TOWER 4  Bed: V409/W119.14                                     Discharge Recommendations:   Discharge Recommendation: SNF   DME Recommended for Discharge: Wheelchair-manual, Front wheel walker, BSC, Tub transfer bench    If SNF recommended discharge disposition is not available, patient will need mod/max a hand on  assist for ADL ,functoinal transfers, cog.skills,activity tolerance with above equipment, and Home OT.       Assessment:   Gregory [name] is a 39 y.o. male admitted 02/22/2015 with fall,AMS presents with decreased cog.skills,self care ADL,functional transfers,sitting balance,activity tolerance pt will benefit from OT intervention.     Therapy Diagnosis: weakness    Rehabilitation Potential: good with ongoing OT intervention.    Treatment Activities: Initial eval. Worked on sitting balance at EOB pt was able to maintain static sitting with CGA dynamic with minA. Educated pt for safety during ADL and functional mobility needs reminders.   Educated the patient to role of occupational therapy, plan of care, goals of therapy and safety with mobility and ADLs.    Plan:   OT Frequency Recommended: 2-3x/wk     Treatment/Interventions: ADL training,Functional transfer training,Therapeutic exercises,Therapeutic activities,Neuromuscular reeducation techniques.    Risks/benefits/POC discussed yes       Precautions and Contraindications:   Up as tolerated,fall     Consult received for Linus Galas for OT Evaluation and Treatment.  Patient's medical condition is appropriate for Occupational Therapy intervention at this time.      History of Present Illness:    Gregory [name] is a 39 y.o. male admitted on 02/22/2015 with   History of Presenting Illness:   Gregory [name] is a 39 y.o. male who presents to the hospital with jaundice and emesis to an outside hospital.  Per documentation, it appears that he had some left sided flank pain after a fall last week. He went to the emergency room at that time and had x-rays done which showed no fractures. He is transferred here from [facility] emergency room. On arrival he was obtunded, but it appears as though he received both Ativan and Dilaudid at the outside hospital. Per EMS reports he consumed alcohol at a wedding yesterday.    Unable to take adequate history at this time due to patient's mental status.    He had an CT scan done at the outside hospital which was reviewed with one of our radiologists here. Noted a distended gallbladder, large left hip intramuscular hematoma, large ascites, left-sided pleural effusion, no free air in the abdomen but evidence of right colonic wall thickening concerning for colitis.            Admitting Diagnosis: Hyperbilirubinemia [E80.6]  Tachycardia [R00.0]  Alcohol withdrawal, with unspecified complication [F10.239]  Altered mental status, unspecified altered mental status type [R41.82]  Liver failure without hepatic coma, unspecified chronicity [K72.90]    Past Medical/Surgical History:  Past Medical History   Diagnosis Date   . Thyroid disease    . Cirrhosis      Past Surgical History   Procedure Laterality Date   . Appendectomy     . Knee surgery       Hardware placement R knee  Imaging/Tests/Labs:  US abdomen 1. The liver demonstrates a coarsened echotexture with a mildly nodular  contour. There is no intrahepatic biliary ductal dilatation or mass.  2. A moderate amount of mobile sludge is identified within a distended  gallbladder. There is no gallstone. The gallbladder wall is edematous  and may be partially due to the surrounding ascites. If there is high  clinical concern of acute cholecystitis a HIDA scan is recommended.  3. There is no hydronephrosis within the right kidney. There is a 1.0 cm  benign appearing upper pole cyst.  4. Ascites. The remainder is as above.  CXR    IMPRESSION:     Basal opacities and left pleural effusion increased  Social History: per pt   Prior Level of Function: Independent  Assistive Devices: none  Baseline Activity: community ambulation,works in [employer]  DME Currently at Home: none  Home Living Arrangements: lives with significant other.  Type of Home: apartment  Home Layout: one step to enter.    Subjective:I feel cold    Patient is agreeable to participation in the therapy session. Nursing clears patient for therapy.     Patient Goal: walk  Pain:   Scale: 0/10  Location:   Intervention:     Objective:   Patient is in bed with telemetry and peripheral IV in place.IMC lines,Jaundice looking,ecchymosis left side of truck and hip and thigh. Edema left thigh. Sitter at bed side      Cognitive Status and Neuro Exam:  Some what lethargic oriented to self  ,does not know the name of the hospital followed simple directions with delay in processing .     Musculoskeletal Examination  RUE ROM: PROm WFL  LUE ROM: PROM WFL  RLE ROM: PROm WFL  LLE ROM: PROM WFL    RUE Strength: grossly> 3- to 3/5  LUE Strength: grossly >3- to 3/5  RLE Strength: >3/5  LLE Strength: >3- to 3/5  Dysmetria both hands.    Sensory/Oculomotor Examination  Auditory: intact.  Tactile: unable to assess  Vision: tracks in all directions ,needs further assessment      Activities of Daily Living  Eating: hand to mouth min A  Grooming: mod A  Bathing: max/D  UE Dressing: modA  LE Dressing: max/D  Toileting: D    Functional Mobility:  Supine to Sit: modA/max a  Sit to Stand: modA x2 half stand  Transfers: along EOB modA x2     Balance  Static Sitting: CGA  Dynamic Sitting: minA sits with kyphotic posture.  Static Standing: NT  Dynamic Standing: NT    Participation and Activity Tolerance  Participation Effort: fair  Endurance: fair    Patient left with call bell within reach, all needs met, SCDs not at bed isde RN aware, fall mat in place, bed alarm on, chair alarm NA and all questions  answered. RN notified of session outcome and patient response.       Goals:  Time For Goal Achievement: 5 visits  ADL Goals  Patient will groom self: Independent, 5 visits, at sinkside, without AE  Patient will dress upper body: Supervision, 5 visits, without AE  Mobility and Transfer Goals  Pt will perform functional transfers: Supervision, 5 visits, with rolling walker  Neuro Re-Ed Goals  Pt will perform dynamic sitting balance: Independent, 5 visits, to increase ability to complete ADLs  Musculoskeletal Goals  Pt will demonstrate increase of strength: to 4+/5, to increase ability to complete ADLs, 5 visits  Pt will perform  fine motor coordination tasks: independent, 5 visits, to increase ability to complete ADLs, w/ home exercise program, feeding, grooming, fasteners  Executive Fucntion Goals  Pt will demonstrate memory/orientation: independent, 5 visits, to increase ability to complete ADLs, to time, to situation, to place  Pt will demonstrate safety: independent, 5 visits, to increase ability to complete ADLs, instituting safety techniques  Pt will demonstrate attention to task: independent, 5 visits, to increase ability to complete ADLs                Ander Purpura, MA/OTR, CSRS, LSVT-BIG  [phone]       Time of treatment:   OT Received On: 02/26/15  Start Time: 1015  Stop Time: 1100  Time Calculation (min): 45 min

## 2015-02-26 NOTE — Malnutrition Assessment (Signed)
NUTRITION:  Reason for Assessment: Screen     Recommend:  Has been either NPO or on clears x 4 days (rec diet advancement when able to low sodium diet; add Ensure Enlive with meals)    Continue folic acid, MVI, thiamine    If unable to advance diet within 1-2 days, consider nutrition support     Clinical Update:  Gregory [name] is a(n) 39 y.o. male s/p fall from ladder with left thigh/gluteal hematoma, EtOH cirrhosis, depressed mental status, ascites, anemia     Labs: reviewed   Meds: folic acid, lactulose, MVI, protonix, thiamine     Anthropometrics:  Height: 193 cm (6\' 4" )  Weight: 81.647 kg (180 lb)  Body mass index is 21.92 kg/(m^2).    Diet / Nutrition Support Order: Clear liquid diet     Nutrition Goals: 2200-2400 kcals, 90-100 gm protein     Nutrition Diagnosis:   Moderate protein energy malnutrition related to EtOH cirrhosis, ascites, hematoma s/p fall a/e/b clinical course, predicted suboptimal po intake PTA, vit supplements    Monitor/Eval:  Monitor nutr support goals, labs, GI, med tx plan    Georgina Pillion, RD

## 2015-02-26 NOTE — Progress Notes (Signed)
Orthopedic Trauma Daily Progress Note    02/26/2015   6:33 AM    Gregory [name] is a 39 y.o. male     S/p   fall from ladder with left thigh/gluteal hematoma    Subjective: NAEON. Denies pain with stress of gluteus maximus. Sitter at bedside/CIWA protocol. Patient Denies nausea, vomiting, or fevers.     Physical Exam:  Filed Vitals:    02/26/15 0500   BP: 128/86   Pulse: 116   Temp: 98.8 F (37.1 C)   Resp: 17   SpO2: 95%        Intake/Output Summary (Last 24 hours) at 02/26/15 0102  Last data filed at 02/25/15 1845   Gross per 24 hour   Intake  937.5 ml   Output      0 ml   Net  937.5 ml       Left Lower Extremity:   skin intact with large hematoma. Compartments remains compressible, tender.                                                                                      +PF/DF/EHL                                                                                                                       Wiggles toes well       Passive ROM-+flexion/extension/int rotation/ext rotation stable from previous exams. Increased ROM with flexion of hip.                       SILT to Toes                                                                                                                        2+ DP/PT  Cap refill <2 secs          Assessment: s/p   fall from ladder with left thigh/gluteal hematoma    Plan:   Mobility: Out of bed as tolerated with PT/OT   Pain control: Continue to wean/titrate to appropriate oral regimen   DVT Prophylaxis (per Ortho Trauma service Protocol): DVT  prophylaxis is not indicated from an Orthopaedic perspective   Foley catheter status: Does not have Foley   Further surgical plans: No further Orthopaedic plans unless develops compartment syndrome    RUE: WBAT   LUE:  WBAT   RLE:  WBAT   LLE:  WBAT   Disposition: Planning for D/C home vs rehab when stable    Pollie Meyer PGY-1  Department of Orthopedics  Page [phone] or call [phone] for questions

## 2015-02-26 NOTE — Plan of Care (Addendum)
Problem: Safety  Goal: Patient will be free from injury during hospitalization  Outcome: Progressing  Pt A/O x 1-2, drowsy but easily aroused. CIWA in progress, scoring range 5-8, Ativan given x 1. ST 100-110's. Tmax 99.9. Neurovascular checks unchanged. Denies any pain, n/v/d. Sitter at bedside. Will continue to monitor.

## 2015-02-27 ENCOUNTER — Encounter: Payer: Self-pay | Admitting: Internal Medicine

## 2015-02-27 LAB — URINALYSIS WITH MICROSCOPIC
Glucose, UA: NEGATIVE
Ketones UA: NEGATIVE
Leukocyte Esterase, UA: NEGATIVE
Nitrite, UA: NEGATIVE
Specific Gravity UA: 1.017 (ref 1.001–1.035)
Urine pH: 6 (ref 5.0–8.0)
Urobilinogen, UA: >12 mg/dL — ABNORMAL HIGH (ref 0.2–2.0)

## 2015-02-27 LAB — CBC
Hematocrit: 20.5 % — ABNORMAL LOW (ref 42.0–52.0)
Hematocrit: 25.3 % — ABNORMAL LOW (ref 42.0–52.0)
Hematocrit: 22.1 % — ABNORMAL LOW (ref 42.0–52.0)
Hematocrit: 23.7 % — ABNORMAL LOW (ref 42.0–52.0)
Hgb: 7.2 g/dL — ABNORMAL LOW (ref 13.0–17.0)
Hgb: 8.9 g/dL — ABNORMAL LOW (ref 13.0–17.0)
Hgb: 7.8 g/dL — ABNORMAL LOW (ref 13.0–17.0)
Hgb: 8.3 g/dL — ABNORMAL LOW (ref 13.0–17.0)
MCH: 32.4 pg — ABNORMAL HIGH (ref 28.0–32.0)
MCH: 32.4 pg — ABNORMAL HIGH (ref 28.0–32.0)
MCH: 32.4 pg — ABNORMAL HIGH (ref 28.0–32.0)
MCH: 32.7 pg — ABNORMAL HIGH (ref 28.0–32.0)
MCHC: 35.1 g/dL (ref 32.0–36.0)
MCHC: 35.2 g/dL (ref 32.0–36.0)
MCHC: 35.3 g/dL (ref 32.0–36.0)
MCHC: 35 g/dL (ref 32.0–36.0)
MCV: 92.3 fL (ref 80.0–100.0)
MCV: 92 fL (ref 80.0–100.0)
MCV: 91.7 fL (ref 80.0–100.0)
MCV: 93.3 fL (ref 80.0–100.0)
MPV: 10.6 fL (ref 9.4–12.3)
MPV: 10.4 fL (ref 9.4–12.3)
MPV: 10.4 fL (ref 9.4–12.3)
MPV: 10.3 fL (ref 9.4–12.3)
Nucleated RBC: 0 /100 WBC (ref 0–1)
Nucleated RBC: 0 /100 WBC (ref 0–1)
Nucleated RBC: 0 /100 WBC (ref 0–1)
Nucleated RBC: 0 /100 WBC (ref 0–1)
Platelets: 51 10*3/uL — ABNORMAL LOW (ref 140–400)
Platelets: 57 10*3/uL — ABNORMAL LOW (ref 140–400)
Platelets: 54 10*3/uL — ABNORMAL LOW (ref 140–400)
Platelets: 58 10*3/uL — ABNORMAL LOW (ref 140–400)
RBC: 2.22 10*6/uL — ABNORMAL LOW (ref 4.70–6.00)
RBC: 2.75 10*6/uL — ABNORMAL LOW (ref 4.70–6.00)
RBC: 2.41 10*6/uL — ABNORMAL LOW (ref 4.70–6.00)
RBC: 2.54 10*6/uL — ABNORMAL LOW (ref 4.70–6.00)
RDW: 21 % — ABNORMAL HIGH (ref 12–15)
RDW: 21 % — ABNORMAL HIGH (ref 12–15)
RDW: 21 % — ABNORMAL HIGH (ref 12–15)
RDW: 22 % — ABNORMAL HIGH (ref 12–15)
WBC: 7.67 10*3/uL (ref 3.50–10.80)
WBC: 8.32 10*3/uL (ref 3.50–10.80)
WBC: 9.23 10*3/uL (ref 3.50–10.80)
WBC: 9.42 10*3/uL (ref 3.50–10.80)

## 2015-02-27 LAB — PREPARE FRESH FROZEN PLASMA
Expiration Date: 201606140624
Expiration Date: 201606142021
Expiration Date: 201606150825
Expiration Date: 201606150825
Status: TRANSFUSED
Status: TRANSFUSED
Status: TRANSFUSED
Status: TRANSFUSED
UTYPE: A POS
UTYPE: B NEG
UTYPE: O POS
UTYPE: O POS

## 2015-02-27 LAB — BASIC METABOLIC PANEL
BUN: 21 mg/dL (ref 9.0–28.0)
CO2: 23 mEq/L (ref 22–29)
Calcium: 7.5 mg/dL — ABNORMAL LOW (ref 8.5–10.5)
Chloride: 107 mEq/L (ref 100–111)
Creatinine: 1.2 mg/dL (ref 0.7–1.3)
Glucose: 97 mg/dL (ref 70–100)
Potassium: 3.2 mEq/L — ABNORMAL LOW (ref 3.5–5.1)
Sodium: 138 mEq/L (ref 136–145)

## 2015-02-27 LAB — PREPARE RBC
Expiration Date: 201606302359
Status: TRANSFUSED
UTYPE: O POS

## 2015-02-27 LAB — HEPATIC FUNCTION PANEL
ALT: 24 U/L (ref 0–55)
AST (SGOT): 119 U/L — ABNORMAL HIGH (ref 5–34)
Albumin/Globulin Ratio: 0.5 — ABNORMAL LOW (ref 0.9–2.2)
Albumin: 2 g/dL — ABNORMAL LOW (ref 3.5–5.0)
Alkaline Phosphatase: 95 U/L (ref 38–106)
Bilirubin Direct: 6.7 mg/dL — ABNORMAL HIGH (ref 0.0–0.5)
Bilirubin Indirect: 5.5 mg/dL — ABNORMAL HIGH (ref 0.0–1.1)
Bilirubin, Total: 12.2 mg/dL — ABNORMAL HIGH (ref 0.2–1.2)
Globulin: 4 g/dL — ABNORMAL HIGH (ref 2.0–3.6)
Protein, Total: 6 g/dL (ref 6.0–8.3)

## 2015-02-27 LAB — PT/INR
PT INR: 2.4 — ABNORMAL HIGH (ref 0.9–1.1)
PT: 25.9 s — ABNORMAL HIGH (ref 12.6–15.0)

## 2015-02-27 LAB — GFR: EGFR: >60

## 2015-02-27 LAB — HIV AG/AB 4TH GENERATION: HIV Ag/Ab, 4th Generation: NONREACTIVE

## 2015-02-27 MED ORDER — SODIUM CHLORIDE 0.9 % IV SOLN
INTRAVENOUS | Status: DC | PRN
Start: 2015-02-27 — End: 2015-02-27

## 2015-02-27 MED ORDER — VITAMIN K1 10 MG/ML IJ SOLN
10.0000 mg | Freq: Once | INTRAMUSCULAR | Status: DC
Start: 2015-02-27 — End: 2015-02-27
  Filled 2015-02-27: qty 1

## 2015-02-27 MED ORDER — FUROSEMIDE 10 MG/ML IJ SOLN
20.0000 mg | Freq: Once | INTRAMUSCULAR | Status: AC
Start: 2015-02-27 — End: 2015-02-27
  Administered 2015-02-27: 20 mg via INTRAVENOUS
  Filled 2015-02-27: qty 4

## 2015-02-27 MED ORDER — LACTULOSE 10 GM/15ML PO SOLN
20.0000 g | Freq: Two times a day (BID) | ORAL | Status: DC
Start: 2015-02-27 — End: 2015-03-01
  Administered 2015-02-27 – 2015-03-01 (×4): 20 g via ORAL
  Filled 2015-02-27 (×3): qty 30

## 2015-02-27 MED ORDER — SODIUM CHLORIDE 0.9 % IV SOLN
INTRAVENOUS | Status: DC | PRN
Start: 2015-02-27 — End: 2015-03-03

## 2015-02-27 MED ORDER — VITAMIN K1 10 MG/ML IJ SOLN
10.0000 mg | Freq: Once | INTRAVENOUS | Status: AC
Start: 2015-02-27 — End: 2015-02-27
  Administered 2015-02-27: 10 mg via INTRAVENOUS
  Filled 2015-02-27: qty 1

## 2015-02-27 MED ORDER — POTASSIUM CHLORIDE CRYS ER 20 MEQ PO TBCR
40.0000 meq | EXTENDED_RELEASE_TABLET | Freq: Once | ORAL | Status: AC
Start: 2015-02-27 — End: 2015-02-27
  Administered 2015-02-27: 40 meq via ORAL
  Filled 2015-02-27: qty 2

## 2015-02-27 NOTE — Student Progress (Signed)
MEDICINE STUDENT PROGRESS NOTE    Date Time: 02/27/2015 6:07 AM  Patient Name: Gregory [name]  Attending Physician: Gregory Edin, MD    Subjective   CC: liver failure    Subjective: He received 2U FFP yesterday and 1U pRBCs. He spiked a fever to 100.9*F after the first unit of FFP, which improved with acetaminophen. Patient received total of 2 doses of acetaminophen in the past 24 hours for pain/fever control. He has received 2 mg Atrivan in past 24 hours. Patient had 3 BMs yesterday and last dose of lactulose were held. Patient has a Comptroller. Condom catheter placed. He was ordered for another unit of pRBCs this morning. Patient reported only mild left thigh pain this morning.     Review of Systems:   Denies n/v/sob  Report left thigh pain    Physical Exam:     VITAL SIGNS PHYSICAL EXAM   Temp:  [94.5 F (34.7 C)-100.9 F (38.3 C)] 97 F (36.1 C)  Heart Rate:  [105-122] 109  Resp Rate:  [14-40] 18  BP: (106-157)/(55-97) 129/73 mmHg  Blood Glucose: 104    Telemetry:       Intake/Output Summary (Last 24 hours) at 02/27/15 0607  Last data filed at 02/27/15 0432   Gross per 24 hour   Intake   1203 ml   Output    250 ml   Net    953 ml    General: NAD  Neuro: A+Ox2, follows commands  Lungs: CTAB, good air movement  Cardiovascular: Tachycardic, normal S1/S2 without murmurs, rubs, or gallops  Abdomen: Non-tender, distended  Extremities: Resting tremor, left thigh hematoma extends from left lower thigh to left flank, no left palpable pulse, pulse detectable via doppler, wiggles toes, non-pitting edema in left LE  Skin: Jaundice  Other: blood on lips and front teeth       Lines (list foley, central lines, picc lines, and date on insertion):   PIV x2, placed 6/5  Condom catheter, placed 6/9    Meds (essential medications - list Abx days, group by class):     Scheduled Meds:  Current Facility-Administered Medications   Medication Dose Route Frequency   . folic acid  1 mg Oral Daily   . lactulose  30 g Oral Q6H   .  multivitamin  1 tablet Oral Daily   . pantoprazole  40 mg Intravenous BID   . rifaximin  550 mg Oral BID   . thiamine  100 mg Oral Daily     Continuous Infusions:     PRN Meds:.sodium chloride, sodium chloride, acetaminophen, LORazepam, naloxone, ondansetron **OR** ondansetron, traMADol      Labs:     Labs (last 72 hours): (ensure neat format, include any other labs not listed below)       Recent Labs  Lab 02/27/15  0151 02/26/15  1406   WBC 7.67 8.64   HGB 7.2* 7.2*   HEMATOCRIT 20.5* 20.3*   PLATELETS 51* 60*         Recent Labs  Lab 02/27/15  0151 02/26/15  0306  02/22/15  1836   PT 25.9* 25.4* More results in Results Review 24.8*   PT INR 2.4* 2.4* More results in Results Review 2.3*   PTT  --   --   --  51*   More results in Results Review = values in this interval not displayed.   Recent Labs  Lab 02/27/15  0151 02/26/15  0306   SODIUM 138 138  POTASSIUM 3.2* 3.5   CHLORIDE 107 108   CO2 23 20*   BUN 21.0 18.0   CREATININE 1.2 1.1   CALCIUM 7.5* 7.9*   ALBUMIN 2.0* 2.3*   PROTEIN, TOTAL 6.0 7.0   BILIRUBIN, TOTAL 12.2* 12.2*   ALKALINE PHOSPHATASE 95 117*   ALT 24 29   AST (SGOT) 119* 155*   GLUCOSE 97 104*                  Microbiology (update daily)  MRSA swab (6/6): negative    Imaging (summarize)  US Abdomen (6/9): Moderate amount of ascites.  CXR (6/7): Basal opacities and left pleural effusion increased.  RUQ [place] (6/6): 1. The liver demonstrates a coarsened echotexture with a mildly nodular  contour. There is no intrahepatic biliary ductal dilatation or mass. 2. A moderate amount of mobile sludge is identified within a distended gallbladder. There is no gallstone. The gallbladder wall is edematous and may be partially due to the surrounding ascites. If there is high clinical concern of acute cholecystitis a HIDA scan is recommended. 3. There is no hydronephrosis within the right kidney. There is a 1.0 cm benign appearing upper pole cyst. 4. Ascites. The remainder is as above.  CT (6/5): 1. No renal or  ureteral calculi or hydronephrosis. 2. Normal lumbar spine. 3. Left pelvic and hip intramuscular hematoma. 4. Cirrhotic appearing liver with abdominal varices and moderate to large ascites. 5. Left lower lobe atelectasis and moderate pleural effusion.   CXR (6/5): 1. Left basilar airspace disease and right basilar atelectasis. 2. Left pleural fluid/thickening.    Assessment (brief and succinct one liner with diagnoses, not symptoms):     [name] is a 39 year old male with PMH of alcohol abuse and alcoholic cirrhosis who presents with AMS, likely due to hepatic encephalopathy, and large left thigh hematoma.     Plan (by active issues, prioritized.  Can just list the inactive issues):   # Left thigh hematoma  - S/p mechanical fall last weekend  - CT at outside hospital showed left hip intramuscular hematoma  - Left pedal pulse not palpable, but detectable by doppler  - Large left thigh hematoma possibly cause of anemia  - Continue tramadol PRN  - Continue to monitor for signs of compartment syndrome    # Anemia, likely acute blood loss  - Recent fall with large left sided hip hematoma - could be the source of anemia but cannot rule out GI etiology  - MCV 92.8, not suggestive of megaloblastic anemia  - No evidence of hematemesis and no report of hematemesis. No documented history of varices available  - Hgb 6.5 on arrival  - Hgb 7.2, goal Hgb is 7-8  - 1 unit pRBC being transfused this morning  - Every 8 hour CBCs, transfuse as needed  - Continue Protonix IV twice daily    # Liver failure likely secondary to alcoholic cirrhosis  - CT at OSH showed cirrhotic appearing liver with abdominal varices and moderate to large ascites  - AST and AST elevated in alcoholic pattern, trending down  - Ceruloplasmin: 15. Falsely low ceruloplasmin levels may be observed in any protein deficiency state.  - HAV IgM, HBV and HCV were negative  - ANA, AMA: negative  - AFP: normal (7.1)  - Follow up ASMA  - Meld score 27, 19.6% estimated  72-month mortality  - Titrate lactulose to 2-3 BMs daily  - Continue rifaximin  - Start vitamin K    #  Ascites  - Abdominal US (6/9): moderate ascites  - Abdomen is more distended than yesterday  - INR steady 2.4  - Per procedure team: INR should be <2  - Per GI: diagnostic paracentesis and check cell count, gram stain, cytology, and albumin    # Pleural effusion  - CXR 6/7: Basal opacities and left pleural effusion increased.  - Received 20 mg furosemide yesterday.   - Re-check CXR    # Active alcohol abuse  - Received 1 banana bag  - Continue daily multivitamin, folic acid, and thiamine  - Continue CIWA protocol and provide ativan as needed, now 4 days s/p last EtOH  - Per CATS: Recommend patient follow up with [facility], [facility] and can be reached at [phone].     # Tachycardic, mostly 110s-120s  - Likely multifactorial: pain, blood loss into thigh hematoma, and EtOH withdrawal    # Altered mental status  - Differential diagnosis: hepatic encephalopathy vs. Wernike- Korsakoff vs. EtOH withdrawal vs. Pain meds  - Continue neuro checks q4 hrs  - Continue sitter to continue to reorient  - Continue haldol PRN    # Fever 100.9 at midnight  - Reaction to transfusion  - Per GI: Infectious workup (UA, urine culture, blood culture x2, CXR). If negative, GI may consider prednisolone    # Thrombocytopenia likely secondary to cirrhosis  - Platelets decreasing, 67 on presentation, now 51  - Consider transfusing platelets  - Differential: secondary to cirrhosis vs. dillutional  - Continue CBCs q 8 hrs    # Hyperbilirubinemia likely secondary to alcoholic cirrhosis vs. Hematoma resorption  - TBili elevated to 12.2 with indirect elevated to 5.5    # Tremor  - Differential diagnosis: asterixis due to hepatic encephalopathy vs. Acute EtOH withdrawal  - No asterixis on exam, likely due to EtOH withdrawal    # AKI, likely hepatorenal syndrome, resolved  - IVF were D/C 2/2 receiving so many blood products    # Nausea and  vomiting, could be due to colitis found on CT scan vs. Cholecystitis, resolved   - RUQ [place] (6/5): moderate mobile sludge in a distended gallbladder.   - Protonix as above  - Zofran as needed  - Follow up GI recs    # Metabolic acidosis with elevated lactate, resolved  - Expect some level of lactic acidosis given liver failure  - Lactic acid is trending down with IV fluids 4.7 --> 3.0 --> 2.3 --> 2.0    DVT prophylaxis: SCDs  FEN/GI: Has been either NPO or on clears x 5 days (rec diet advancement when able to low sodium diet; add Ensure Enlive with meals)  Code status: full code    Disposition (what needs to be accomplished before the patient returns home):   Anemia stabilized  Discharge to SNF    Signed by: Dewitt Rota, BS

## 2015-02-27 NOTE — Progress Notes (Addendum)
Receiving 1/2 FFP as ordered. Temp up to 100.9 F oral at 1st 1 hour check. No other s/s indicating a transfusion reaction. Resident notified. Reviewing chart. To give tylenol before next FFP as ordered, and notify MD if temp >101.5 F or O2 below 92%. Resident stated its "OK" to hold lactulose, ammonia level is WDL, and pt is having BM on own. Will hold 6 AM dose.

## 2015-02-27 NOTE — Progress Note - Problem Oriented Charting Notewrit (Signed)
MEDICINE PROGRESS NOTE    Date Time: 02/27/2015 5:34 AM  Patient Name: Gregory [name]  Attending Physician: Lucilla Edin, MD   Medicine Resident Team A  Spectra: [phone]  Night: [phone]    Attention CONSULTANTS:   This patient is covered by the internal medicine teaching service.   Except in cases of emergency, please contact the medicine intern   or resident first for any questions or to place orders.      Assessment/Plan:   39yo gentleman with history of alcoholic cirrhosis, who was transferred from [facility] ED on 6/5 with depressed mental status and found to have anemia likely due to large L thigh hematoma, as well as ascites and biliary sludge.  Persistent anemia, thrombocytopenia, and INR elevation, requiring blood product transfusion.        #Anemia  Likely secondary to enlarging L thigh hematoma due to recent fall noted on OSH CT. Less likely but cannot rule out GI bleed as pt reports taking ibuprofen for thigh pain at home, but no known history of hematemesis/GI bleed. Minimal improvement in H/H and INR despite multiple units of FFP and pRBC transfusion.    - Ortho and GI consulted - f/u recs  - 3U FFP and IV vitamin K now given ongoing INR elevation and persistent bleed  - Monitor CBC 8h, transfuse for Hct <22  - Protonix IV 40mg  BID    #LFT elevation and hepatic cirrhosis, likely from EtOH use, with large ascites noted on outside CT  MELD score 26.7.  HepBsAg, HepC Ab neg.  ANA, ASMA, AFP neg. Repeat abdominal US on 6/9 still with moderate ascites.   - Check blood and urine cultures - new fevers may be in response to transfusion, but will need to rule out infection.  If neg will consider prednisolone.   - Diagnostic and therapeutic paracentesis when INR and PLT appropriately reduced   - Lactulose, titrate to 2-3 soft stools daily  - Rifaximin 550mg  BID    #L thigh hematoma  Recent fall with left sided hip trauma and L thigh hematoma noted on OSH CT  - Neurovascular checks of LLE  - Low dose tylenol  and tramadol for pain     #Active alcohol abuse  - CIWA protocol  - PO multivitamin, folic acid, and thiamine  - CATS consult    #Altered mental status  Likely from dilaudid and ativan given outside hospital but can rule out hepatic encephalopathy.  Improving slowly.   - Lactulose and rifaximin as above.  Continue CIWA and avoid sedating meds as much as possible.     #Sinus tachycardia   Likely multi-factorial, from pain, anxiety, EtOH withdrawal.    - Continue CIWA, monitor fluid status      #Thrombocytopenia likely secondary to cirrhosis  - Monitor with daily CBC.  Transfuse as above.     #Distended gallbladder on outside CT scan  6/6 RUQ US showed mobile sludge seen within a distended gallbladder and edematous gallbladder wall is edematous (which may be partially due to the surrounding ascites), no gallstone.    - Per GI, exam not concerning, hold off on HIDA scan for now    FEN: regular diet  Ppx: SCD for DVT  Code: FULL       Safety Checklist:     DVT prophylaxis:  CHEST guideline (See page [id number]) Mechanical   Foley:  [place] Rn Foley protocol Not present *Condom cath   IVs:  Peripheral IV   PT/OT: Ordered   Daily  CBC & or Chem ordered:  SHM/ABIM guidelines (see #5) Yes, due to clinical and lab instability   Reference for approximate charges of common labs: CBC auto diff - $76  BMP - $99  Mg - $79    Disposition:     Anticipated discharge needs: TBD    Subjective     CC: anemia     24 hour events: Intermittent fever to 38.3 overnight.  Received total 2U pRBC and 2U FFP past 24h.  Condom cath placed for scrotum swelling.      Subjective: Reports fatigue and poor sleep.  Denies chest pain, dyspnea.  Ongoing hip pain     Review of Systems:     As above    Physical Exam:     VITAL SIGNS PHYSICAL EXAM   Temp:  [94.5 F (34.7 C)-100.9 F (38.3 C)] 98.8 F (37.1 C)  Heart Rate:  [105-122] 109  Resp Rate:  [14-40] 18  BP: (106-157)/(55-97) 123/78 mmHg  SpO2: room air  Body mass index is 21.92  kg/(m^2).      Intake/Output Summary (Last 24 hours) at 02/27/15 0534  Last data filed at 02/27/15 0432   Gross per 24 hour   Intake   1203 ml   Output    250 ml   Net    953 ml    GEN: thin jaundiced ill appearing gentleman, lying in bed with head low and teeth shattering  HEENT: mild gum bleeding  CV: RRR, no murmurs, no JVD  PULM: ctab, no wheezes/crackles  ABDO: soft, non-tender, increased abdominal distention, no guarding/rebound  EXTREMITIES: no lower/upper extremity edema, L lateral thigh with enlarging hematoma and mild swelling and significant tenderness, DP pulses dopplerable bilaterally and warm to touch  NEURO: awake and alert, alert and oriented to person and place, able to follow simple commands       Meds:      Scheduled Meds: PRN Meds:        folic acid 1 mg Oral Daily   lactulose 30 g Oral Q6H   multivitamin 1 tablet Oral Daily   pantoprazole 40 mg Intravenous BID   rifaximin 550 mg Oral BID   thiamine 100 mg Oral Daily        sodium chloride  PRN   sodium chloride  PRN   acetaminophen 325 mg Q4H PRN   LORazepam 2-4 mg PRN   naloxone 0.2 mg PRN   ondansetron 4 mg Q8H PRN   Or     ondansetron 4 mg Q8H PRN   traMADol 25 mg Q6H PRN           Labs:     Labs (last 72 hours):      Recent Labs  Lab 02/27/15  0151 02/26/15  1406   WBC 7.67 8.64   HGB 7.2* 7.2*   HEMATOCRIT 20.5* 20.3*   PLATELETS 51* 60*         Recent Labs  Lab 02/27/15  0151 02/26/15  0306  02/22/15  1836   PT 25.9* 25.4* More results in Results Review 24.8*   PT INR 2.4* 2.4* More results in Results Review 2.3*   PTT  --   --   --  51*   More results in Results Review = values in this interval not displayed.   Recent Labs  Lab 02/27/15  0151 02/26/15  0306   SODIUM 138 138   POTASSIUM 3.2* 3.5   CHLORIDE 107 108   CO2 23 20*  BUN 21.0 18.0   CREATININE 1.2 1.1   CALCIUM 7.5* 7.9*   ALBUMIN 2.0* 2.3*   PROTEIN, TOTAL 6.0 7.0   BILIRUBIN, TOTAL 12.2* 12.2*   ALKALINE PHOSPHATASE 95 117*   ALT 24 29   AST (SGOT) 119* 155*   GLUCOSE 97 104*           Microbiology, reviewed and significant for:  MRSAneg    Imaging, reviewed and significant for:  Xr Lumbar Spine Ap And Lateral    02/17/2015   IMPRESSION: No acute osseous abnormality of the lumbar spine.   ReadingStation:[id number]    US Abdomen Limited Ruq    02/23/2015    1. The liver demonstrates a coarsened echotexture with a mildly nodular contour. There is no intrahepatic biliary ductal dilatation or mass. 2. A moderate amount of mobile sludge is identified within a distended gallbladder. There is no gallstone. The gallbladder wall is edematous and may be partially due to the surrounding ascites. If there is high clinical concern of acute cholecystitis a HIDA scan is recommended. 3. There is no hydronephrosis within the right kidney. There is a 1.0 cm benign appearing upper pole cyst. 4. Ascites. The remainder is as above. Stephannie Peters, MD  02/23/2015 8:36 AM     Xr Chest Ap Portable    02/24/2015    Basal opacities and left pleural effusion increased.  Clide Cliff, MD  02/24/2015 10:00 AM     Xr Chest  Ap Portable    02/22/2015   Impression: 1. Left basilar airspace disease and right basilar atelectasis. 2. Left pleural fluid/thickening.  Pennelope Bracken, MD  02/22/2015 4:17 PM     Xr Pelvis Portable    02/17/2015    No acute osseous abnormality of the pelvis.  ReadingStation:[id number]    US Abdomen Limited Single Organ    02/26/2015   Impression: Moderate amount of ascites.  Pennelope Bracken, MD  02/26/2015 3:11 PM         Discussed with attending Dr. Shaaron Adler, Medicine PGY1 Pager [phone]

## 2015-02-27 NOTE — Plan of Care (Signed)
Problem: Pain  Goal: Patient's pain/discomfort is manageable  Outcome: Progressing  Pt c/o pain to left leg, 25mg  po tramadol given w/ relief.     Problem: Urinary Incontinence  Goal: Perineal skin integrity is maintained or improved  Assess genitourinary system, perineal skin, labs (urinalysis), and history of incontinence to include past management, aggravating, and alleviating factors. Collaborate with interdisciplinary team and initiate plans and interventions as needed.   Outcome: Progressing  Condom cath on, perineal care completed.    Problem: Moderate/High Fall Risk Score >5  Goal: Patient will remain free of falls  Outcome: Progressing  1:1 sitter maintained, pt asked to get oob however c/o pain w/ movement, bed alarm and falls mat in place, hourly rounding cont.     Comments:   Pt s/p 2 units FFP, pt received lasix post PRBC on previous shift, resident notified, 20mg  IV lasix ordered 3rd unit FFP cancelled.

## 2015-02-27 NOTE — PT Progress Note (Signed)
Physical Therapy Note  Physical Therapy Treatment Progress Note  [facility]     Patient:  Gregory [name] MRN#:  [id number]  Unit:  [facility] TOWER 10 Room/Bed:  V4098/J1914.78    Patient's medical condition is appropriate for Physical Therapy intervention at this time.  Patient cleared for PT intervention.      Discharge Recommendations:   D/C Recommendations: Home with home health PT vs. SNF 2/2 poor mobility demonstrated today.   DME Recommendations: Single point cane       Assessment:   Currently presenting with decreased UE and LE strength, decreased independence with transfers and ambulation, decreased endurance 2' deconditioning.  Patient would benefit from further skilled PT to reach goals.     Patient left without needs and call bell within reach. RN notified of session outcome.     Treatment Activities: Patient was reporting 8/10 pain upon arrival of PT and resistant to mobility.  He was agreeable to perform LE activities in bed.  He wiggled toes on both feet and performed R ankle pumps, he was able to bend and straighten the R knee with min assist 2/2 weakness.  He refused other ROM exercises.  He reported that he was unable to move his L arm and was unable to squeeze this PTs hand.  He moved his R arm independently during the session.  PROM of the L knee and ankle were performed.  Patient reported that he was comfortable and did not want to be repositioned.  The nurse notes that they just moved him and he is OK to stay there.      Educated the patient to role of physical therapy, plan of care, goals of therapy and HEP.      Plan:            PT Frequency: 2-3x/wk   Continue plan of care.      Precautions:   Falls. Up as tolerated.        Subjective:      Subjective:  "I am feeling awful"     Patient is agreeable to participation in the therapy session at a limited capacity.    Cognition: appears intact, appears sleepy, but patient closing his eyes often during the session.      Objective:     Patient received in bed with R SCD, tele, PIV in place Ecchymosis persists in the L hip and thigh and L forearm. Sitter present.     Pain:   Scale: 8/10   Location: all over, the hip too.   Intervention: nursing made aware.     Functional Mobility  Otherwise deferred.   Educated the patient to role of physical therapy, plan of care, goals of therapy and safety with mobility.           Goals  Goal Formulation: With patient  Goals: Select goal  Pt Will Go Supine To Sit: modified independent, to maximize functional mobility and independence  Pt Will Perform Sit to Stand: with supervision, to maximize functional mobility and independence  Pt Will Transfer Bed/Chair: with supervision, to maximize functional mobility and independence  Pt Will Ambulate: with single point cane, with stand by assist, to maximize functional mobility and independence, 101-150 feet (or least restrictive assistive device)      Patient left with call bell within reach, all needs met, SCD on R LE, fall mat in place, bed alarm on and all questions answered. RN notified of session outcome and patient response.  Despina Hidden, PT, DPT, OCS, COMT    Time of Treatment  PT Received On: 02/27/15  Start Time: 1510  Stop Time: 1540  Time Calculation (min): 30 min  Treatment # 2 out of

## 2015-02-27 NOTE — Progress Notes (Signed)
Attending Attestation:     I have seen and personally examined the patient.  I agree with the findings and exam as documented by Dr. Tedra Coupe.    Plan:  1.  Anemia secondary to left thigh hematoma.  We will continue to monitor  complete blood count and transfuse as needed.  2.  Transaminitis secondary to cirrhosis, likely secondary to alcohol  abuse.  Continue with supportive care.  3.  Left thigh hematoma.  Close monitoring and exam for ruling out  compartment syndrome.  Orthopedic team is on board.  4.  History of alcohol abuse with active withdrawal.  Currently on Ativan,  CIWA protocol.  5.  Thrombocytopenia likely secondary to hepatic dysfunction.  6.  Gastrointestinal and deep venous thrombosis prophylaxis as needed.          Disposition:     Today's date: 02/27/2015  Admit Date: 02/25/2015 10:25 PM  Anticipated medical stability for discharge:Red - not tomorrow - estimated month/date: 03-02-2015  Service status: Inpatient: risk of morbidity and mortality, risk of progressive disease and risk of readmission  Reason for ongoing hospitalization: Dt  Anticipated discharge needs: tbd     Lucilla Edin, MD

## 2015-02-27 NOTE — Progress Notes (Signed)
Week End Summary: [facility] transfer. PT recommending Home PT. OT recommends SNF    ACTIONS TAKEN:     Chart reviewed. Report received from MD. Transitional Clinic arranged for 03/05/15 at 2:30pm. AVS updated.    TASKS TO COMPLETE:     Follow for final discharge recommendations. Obtain SCM approval, if necessary    Discharge Plan:   Home with TC appt vs. Home w/HHC ([facility]) and TC Appt    Barriers:   uninsured    Transportation:    Family vs. PTS van      For weekend discharge planning needs, please call [phone]    Raynald Kemp, [facility] Discharge Planner  NPT 10 -Medicine Unit  [phone]

## 2015-02-27 NOTE — Plan of Care (Addendum)
Problem: Safety  Goal: Patient will be free from injury during hospitalization  Outcome: Progressing  Pt at a high fall risk. Pt A&O x2-3, Seizure precations, 1:1 at bedside. CIWA precautions, administered 1x thus far for a score of 8 (Pt stated he wants to kill me and pee in my car). Not physically aggressive. Slurred speech. Will recheck per protocol. Neuro Q4hr. LLE very swollen and painful with movement in hip. Pt able to wiggle toes and lift leg. Scrotum is also swollen. Condom catheter placed per order. Pt educated on call bell. Pt encouraged to use call bell before getting out of bed. Call bell within reach. Bed alarm on. 3/4 rails up. Area free of debris. Bedside table within reach. Bed in lowest position. Intentional hourly rounding complete. No falls this shift.    Will continue to monitor pt for fall risk.     Comments:   Labs drawn per order- resident stated to draw H&H after blood administration and FFP x1. FFP x2 running. Steady low grade temp. Tylenol administered per order x1. H&H around the same as previous draw, platelet count dropping. Resident consulted. New order for RBC x1 to be administered after current FFP administration. Will continue to monitor closely. MD notified of potassium 3.2.

## 2015-02-28 ENCOUNTER — Inpatient Hospital Stay: Payer: Charity

## 2015-02-28 LAB — BASIC METABOLIC PANEL
BUN: 21 mg/dL (ref 9.0–28.0)
CO2: 21 mEq/L — ABNORMAL LOW (ref 22–29)
Calcium: 8 mg/dL — ABNORMAL LOW (ref 8.5–10.5)
Chloride: 105 mEq/L (ref 100–111)
Creatinine: 1.1 mg/dL (ref 0.7–1.3)
Glucose: 82 mg/dL (ref 70–100)
Potassium: 3.5 mEq/L (ref 3.5–5.1)
Sodium: 137 mEq/L (ref 136–145)

## 2015-02-28 LAB — CBC WITH MANUAL DIFFERENTIAL
Band Neutrophils Absolute: 0.16 10*3/uL (ref 0.00–1.00)
Band Neutrophils: 2 %
Cell Morphology: ABNORMAL — AB
Eosinophils Absolute Manual: 0.07 10*3/uL (ref 0.00–0.70)
Eosinophils Manual: 1 %
Hematocrit: 21.7 % — ABNORMAL LOW (ref 42.0–52.0)
Hgb: 7.6 g/dL — ABNORMAL LOW (ref 13.0–17.0)
Lymphocytes Absolute Manual: 0.98 10*3/uL (ref 0.50–4.40)
Lymphocytes Manual: 12 %
MCH: 32.6 pg — ABNORMAL HIGH (ref 28.0–32.0)
MCHC: 35 g/dL (ref 32.0–36.0)
MCV: 93.1 fL (ref 80.0–100.0)
MPV: 11.4 fL (ref 9.4–12.3)
Monocytes Absolute: 0.83 10*3/uL (ref 0.00–1.20)
Monocytes Manual: 10 %
Neutrophils Absolute Manual: 6.13 10*3/uL (ref 1.80–8.10)
Nucleated RBC: 0 /100 WBC (ref 0–1)
Platelet Estimate: DECREASED — AB
Platelets: 56 10*3/uL — ABNORMAL LOW (ref 140–400)
RBC: 2.33 10*6/uL — ABNORMAL LOW (ref 4.70–6.00)
RDW: 23 % — ABNORMAL HIGH (ref 12–15)
Segmented Neutrophils: 75 %
WBC: 8.17 10*3/uL (ref 3.50–10.80)

## 2015-02-28 LAB — CBC
Hematocrit: 22.8 % — ABNORMAL LOW (ref 42.0–52.0)
Hgb: 8 g/dL — ABNORMAL LOW (ref 13.0–17.0)
MCH: 33.3 pg — ABNORMAL HIGH (ref 28.0–32.0)
MCHC: 35.1 g/dL (ref 32.0–36.0)
MCV: 95 fL (ref 80.0–100.0)
MPV: 10.4 fL (ref 9.4–12.3)
Nucleated RBC: 1 /100 WBC (ref 0–1)
Platelets: 58 10*3/uL — ABNORMAL LOW (ref 140–400)
RBC: 2.4 10*6/uL — ABNORMAL LOW (ref 4.70–6.00)
RDW: 23 % — ABNORMAL HIGH (ref 12–15)
WBC: 10.02 10*3/uL (ref 3.50–10.80)

## 2015-02-28 LAB — BLOOD GAS, ARTERIAL
Arterial Total CO2: 24.9 mEq/L (ref 24.0–30.0)
Base Excess, Arterial: 1.1 mEq/L (ref <=2.0)
FIO2: 21 %
HCO3, Arterial: 24 mEq/L (ref 23.0–29.0)
O2 Sat, Arterial: 97.1 % (ref 95.0–100.0)
Temperature: 37
pCO2, Arterial: 31.7 mmhg — ABNORMAL LOW (ref 35.0–45.0)
pH, Arterial: 7.491 — ABNORMAL HIGH (ref 7.350–7.450)
pO2, Arterial: 61.3 mmhg — ABNORMAL LOW (ref 80.0–90.0)

## 2015-02-28 LAB — HEPATIC FUNCTION PANEL
ALT: 25 U/L (ref 0–55)
AST (SGOT): 114 U/L — ABNORMAL HIGH (ref 5–34)
Albumin/Globulin Ratio: 0.5 — ABNORMAL LOW (ref 0.9–2.2)
Albumin: 2.3 g/dL — ABNORMAL LOW (ref 3.5–5.0)
Alkaline Phosphatase: 100 U/L (ref 38–106)
Bilirubin Direct: 9 mg/dL — ABNORMAL HIGH (ref 0.0–0.5)
Bilirubin Indirect: 7.7 mg/dL — ABNORMAL HIGH (ref 0.0–1.1)
Bilirubin, Total: 16.7 mg/dL — ABNORMAL HIGH (ref 0.2–1.2)
Globulin: 4.6 g/dL — ABNORMAL HIGH (ref 2.0–3.6)
Protein, Total: 6.9 g/dL (ref 6.0–8.3)

## 2015-02-28 LAB — PREPARE FRESH FROZEN PLASMA
Expiration Date: 201606122116
Expiration Date: 201606160855
Status: TRANSFUSED
Status: TRANSFUSED
UTYPE: AB POS
UTYPE: O POS

## 2015-02-28 LAB — PT AND APTT
PT INR: 2.1 — ABNORMAL HIGH (ref 0.9–1.1)
PT: 23 s — ABNORMAL HIGH (ref 12.6–15.0)
PTT: 40 s — ABNORMAL HIGH (ref 23–37)

## 2015-02-28 LAB — LACTIC ACID, PLASMA: Lactic Acid: 1.6 mmol/L (ref 0.2–2.0)

## 2015-02-28 LAB — FIBRINOGEN: Fibrinogen: 160 mg/dL — ABNORMAL LOW (ref 189–458)

## 2015-02-28 LAB — GFR: EGFR: >60

## 2015-02-28 MED ORDER — PHYTONADIONE 5 MG PO TABS
5.0000 mg | ORAL_TABLET | Freq: Once | ORAL | Status: AC
Start: 2015-02-28 — End: 2015-02-28
  Administered 2015-02-28: 5 mg via ORAL
  Filled 2015-02-28: qty 1

## 2015-02-28 MED ORDER — SODIUM CHLORIDE 0.9 % IV SOLN
INTRAVENOUS | Status: DC | PRN
Start: 2015-02-28 — End: 2015-03-03

## 2015-02-28 MED ORDER — TRAMADOL HCL 50 MG PO TABS
25.0000 mg | ORAL_TABLET | Freq: Two times a day (BID) | ORAL | Status: DC | PRN
Start: 2015-02-28 — End: 2015-03-03
  Administered 2015-03-02 – 2015-03-03 (×3): 25 mg via ORAL
  Filled 2015-02-28 (×3): qty 1

## 2015-02-28 NOTE — Plan of Care (Deleted)
Problem: Health Promotion  Goal: Knowledge - disease process  Extent of understanding conveyed about a specific disease process.   Outcome: Progressing  Updated sister over the phone and while visiting. Pt compliant with taking most of his medications. Needs encouragement. K 3.1 Kdur 40 meq po given. Appetite fair. Requires total assist with meals. Requesting urinal and bedpan.     Problem: Safety  Goal: Patient will be free from injury during hospitalization  Outcome: Progressing  Fall precautions in place: bed in lowest position, fall mats in place, hourly rounding, call bell within reach, bed alarm activated.

## 2015-02-28 NOTE — Procedures (Signed)
MIDLINE INSERTION PROCEDURE     Gregory [name], Gregory [name]  02/28/2015    INDICATIONS: Therapy less than 28 days  PROCEDURE DETAILS:   The patient was positioned and Ultrasound was used to confirm patency of the Right Basilic vein prior to obtaining venous access. The arm was scrubbed with 2% chlorhexidine per guidelines and a maximal sterile field was established for the patient.  The clinician was attired with cap, mask and sterile gown/gloves prior to start.     Trimable 20cm Non-Power Poly Midline:  A sterile cover was sheathed to the Ultrasound probe.  The vein was then revisualized and 1% lidocaine injected prior to puncture of the Right Basilic vein with a 21-gauge single-wall needle under direct sonographic guidance.  The guidewire was advanced through the needle, the needle was removed and a peel-away sheath was placed over the wire.  After measuring from the insertion site to the midline of the upper arm the catheter was trimmed to insure tip location below the axillary.  The catheter was then advanced through the peel-away sheath.  The sheath was removed.  The catheter was flushed with normal saline to confirm brisk blood return and capped.  The catheter was stabilized on the skin using a securement device.  Antimicrobial disk and sterile transparent occlusive dressing applied using aseptic technique.    Patient did tolerate the procedure well.   Catheter Type: 4Fr non-power Poly Midline  Insertion Site: Right Basilic vein  Total length: 15 cm  Internal Length:  15 cm  External Length: 0 cm  UAC: 26cm    Midline Reference #: [id number]  Midline Kit Lot#: [id number]  Midline Kit expiration date: 03/2016    Findings/Conclusions:  No signs of bleeding or symptoms of nerve irritation noted at time of insertion procedure.    Tip location is below the level of the axilla in Basilic vein. Midline is ready for immediate use.    Midline is ready for immediate use    Narda Amber, RN

## 2015-02-28 NOTE — Progress Notes (Signed)
Pt received from CT scan w/ sitter. Pt lethargic, opens eyes to voice. Orientedx3. Follows commands. Large bruising on L thigh, L flank (although no increase in size during this shift). Swelling to L leg/foot, L pedal and posterior tib pulses found w/ doppler. More awake and interactive as the night went on. Remains HDS.     H/H dropped, MD notified, 2 units PRBC ordered and infusing. H/H to be re-checked day shift. Lactic acid normal. K+ low this AM, notified MD and supplement ordered.    Continue to trend labs, monitor hematoma, currently stable per CT scan 02/28/15.

## 2015-02-28 NOTE — Progress Note - Problem Oriented Charting Notewrit (Signed)
MEDICINE PROGRESS NOTE    Date Time: 02/28/2015 6:10 AM  Patient Name: Gregory [name]  Attending Physician: Lucilla Edin, MD   Medicine Resident Team A  Spectra: [phone]  Night: [phone]    Attention CONSULTANTS:   This patient is covered by the internal medicine teaching service.   Except in cases of emergency, please contact the medicine intern   or resident first for any questions or to place orders.      Assessment   39yo gentleman with history of alcoholic cirrhosis, who was transferred from [facility] ED on 6/5 with depressed mental status and found to have anemia likely due to large L thigh hematoma, as well as ascites and biliary sludge.  Persistent anemia, thrombocytopenia, and INR elevation, requiring blood product transfusion.     Plan   #Anemia  Likely secondary to enlarging L thigh hematoma due to recent fall noted on OSH CT. Less likely but cannot rule out GI bleed as pt reports taking ibuprofen for thigh pain at home, but no known history of hematemesis/GI bleed. Minimal improvement in H/H and INR despite multiple units of FFP and pRBC transfusion.  Thigh hematoma continues to enlarge and now reaching the R flank  - Ortho following - f/u recs  - Repeat CT abdomen today to evaluate for retroperitoneal bleed  - FFP 2U and vitamin K PO today given ongoing anemia  - Monitor CBC 8h, transfuse for Hct <22  - Protonix IV 40mg  BID    #Fevers   Unclear source.  May be in response to frequent blood product transfusions, but hematoma could also be source of infection.  - f/u blood cultures (ngtd)  - CT abdomen as above    #LFT elevation and hepatic cirrhosis, likely from EtOH use, with large ascites noted on outside CT  MELD score 26.7.  HepBsAg, HepC Ab neg.  ANA, ASMA, AFP neg. Repeat abdominal US on 6/9 still with moderate ascites.   - Check blood and urine cultures - new fevers may be in response to transfusion, but will need to rule out infection.  If neg will consider prednisolone.   - Diagnostic and  therapeutic paracentesis when INR and PLT appropriately reduced   - Lactulose, titrate to 2-3 soft stools daily  - Rifaximin 550mg  BID    #L thigh hematoma  Recent fall with left sided hip trauma and L thigh hematoma noted on OSH CT  - Neurovascular checks of LLE  - Low dose tylenol and tramadol for pain     #Active alcohol abuse  - CIWA protocol  - PO multivitamin, folic acid, and thiamine  - CATS consult    #Altered mental status  Likely from dilaudid and ativan given outside hospital but can rule out hepatic encephalopathy.  Improving slowly.   - Lactulose and rifaximin as above.  Continue CIWA and avoid sedating meds as much as possible (reduce tramadol dose).     #Sinus tachycardia   Likely multi-factorial, from pain, anxiety, EtOH withdrawal.    - Continue CIWA, monitor fluid status      #Thrombocytopenia likely secondary to cirrhosis  - Monitor with daily CBC.  Transfuse as above.     #Distended gallbladder on outside CT scan  6/6 RUQ US showed mobile sludge seen within a distended gallbladder and edematous gallbladder wall is edematous (which may be partially due to the surrounding ascites), no gallstone.    - Per GI, exam not concerning, hold off on HIDA scan for now    FEN:  regular diet  Ppx: SCD for DVT  Code: FULL       Safety Checklist:     DVT prophylaxis:  CHEST guideline (See page [id number]) Mechanical   Foley:  [place] Rn Foley protocol Not present *Condom cath   IVs:  Peripheral IV   PT/OT: Ordered   Daily CBC & or Chem ordered:  SHM/ABIM guidelines (see #5) Yes, due to clinical and lab instability   Reference for approximate charges of common labs: CBC auto diff - $76  BMP - $99  Mg - $79    Disposition:     Anticipated discharge needs: TBD    Subjective     CC: anemia     24 hour events: Intermittent fever to 38.6 overnight.  Received total 2U FFP and vitamin K past 24h. Agitated overnight, trying to climb out of bed.    Subjective: Very sleepy this morning and unable to obtain full ROS.  Knows his  name and hospital and complains of ongoing pain, but cannot state anything else.     Review of Systems:     As above    Physical Exam:     VITAL SIGNS PHYSICAL EXAM   Temp:  [98.7 F (37.1 C)-101 F (38.3 C)] 100.1 F (37.8 C)  Heart Rate:  [110-120] 115  Resp Rate:  [16-20] 17  BP: (116-140)/(76-90) 122/78 mmHg  SpO2: room air  Body mass index is 21.92 kg/(m^2).      Intake/Output Summary (Last 24 hours) at 02/28/15 0610  Last data filed at 02/28/15 0300   Gross per 24 hour   Intake    940 ml   Output    750 ml   Net    190 ml    GEN: thin jaundiced ill appearing gentleman  HEENT: minimal gum bleeding  CV: RRR, no murmurs, no JVD  PULM: ctab, no wheezes/crackles  ABDO: soft, non-tender, increased abdominal distention, no guarding/rebound  EXTREMITIES: no lower/upper extremity edema, L lateral thigh with enlarging hematoma and mild swelling and significant tenderness, DP pulses dopplerable bilaterally and warm to touch  NEURO: awake and alert, alert and oriented to person and place only, able to follow simple commands       Meds:      Scheduled Meds: PRN Meds:        folic acid 1 mg Oral Daily   lactulose 20 g Oral BID   multivitamin 1 tablet Oral Daily   pantoprazole 40 mg Intravenous BID   rifaximin 550 mg Oral BID   thiamine 100 mg Oral Daily        sodium chloride  PRN   sodium chloride  PRN   acetaminophen 325 mg Q4H PRN   LORazepam 2-4 mg PRN   naloxone 0.2 mg PRN   ondansetron 4 mg Q8H PRN   Or     ondansetron 4 mg Q8H PRN   traMADol 25 mg Q6H PRN           Labs:     Labs (last 72 hours):      Recent Labs  Lab 02/27/15  2251 02/27/15  1708   WBC 9.42 9.23   HGB 8.3* 7.8*   HEMATOCRIT 23.7* 22.1*   PLATELETS 58* 54*         Recent Labs  Lab 02/27/15  0151 02/26/15  0306  02/22/15  1836   PT 25.9* 25.4* More results in Results Review 24.8*   PT INR 2.4* 2.4* More results in Results Review  2.3*   PTT  --   --   --  51*   More results in Results Review = values in this interval not displayed.   Recent  Labs  Lab 02/27/15  0151 02/26/15  0306   SODIUM 138 138   POTASSIUM 3.2* 3.5   CHLORIDE 107 108   CO2 23 20*   BUN 21.0 18.0   CREATININE 1.2 1.1   CALCIUM 7.5* 7.9*   ALBUMIN 2.0* 2.3*   PROTEIN, TOTAL 6.0 7.0   BILIRUBIN, TOTAL 12.2* 12.2*   ALKALINE PHOSPHATASE 95 117*   ALT 24 29   AST (SGOT) 119* 155*   GLUCOSE 97 104*          Microbiology, reviewed and significant for:  MRSAneg    Imaging, reviewed and significant for:  Xr Lumbar Spine Ap And Lateral    02/17/2015   IMPRESSION: No acute osseous abnormality of the lumbar spine.   ReadingStation:[id number]    US Abdomen Limited Ruq    02/23/2015    1. The liver demonstrates a coarsened echotexture with a mildly nodular contour. There is no intrahepatic biliary ductal dilatation or mass. 2. A moderate amount of mobile sludge is identified within a distended gallbladder. There is no gallstone. The gallbladder wall is edematous and may be partially due to the surrounding ascites. If there is high clinical concern of acute cholecystitis a HIDA scan is recommended. 3. There is no hydronephrosis within the right kidney. There is a 1.0 cm benign appearing upper pole cyst. 4. Ascites. The remainder is as above. Stephannie Peters, MD  02/23/2015 8:36 AM     Xr Chest Ap Portable    02/24/2015    Basal opacities and left pleural effusion increased.  Clide Cliff, MD  02/24/2015 10:00 AM     Xr Chest  Ap Portable    02/22/2015   Impression: 1. Left basilar airspace disease and right basilar atelectasis. 2. Left pleural fluid/thickening.  Pennelope Bracken, MD  02/22/2015 4:17 PM     Xr Pelvis Portable    02/17/2015    No acute osseous abnormality of the pelvis.  ReadingStation:[id number]    US Abdomen Limited Single Organ    02/26/2015   Impression: Moderate amount of ascites.  Pennelope Bracken, MD  02/26/2015 3:11 PM         Discussed with attending Dr. Shaaron Adler, Medicine PGY1 Pager [phone]

## 2015-02-28 NOTE — Plan of Care (Signed)
Problem: Safety  Goal: Patient will be free from injury during hospitalization  Outcome: Progressing  Pt confused and continues to attempt to get out of bed unassisted. Fall prevention plan implemented. Pt currently free from falls/injury during shift. Will continue to monitor.

## 2015-02-28 NOTE — Progress Notes (Signed)
Report given to Rahel (extension [phone]), who confirmed she will be caring for pt in room 435. Opportunities provided to ask questions. All questions answered. Rahel, RN verbalizes understanding of plan of care and was informed that pt is currently being transported to CT lab for ordered testing and will be transported to room 435 after  CT test.

## 2015-02-28 NOTE — Progress Notes (Signed)
Attending Attestation:     I have seen and personally examined the patient.  I agree with the findings and exam as documented by Dr. Tedra Coupe.    Plan:    Anemia secondary to acute blood loss due to left thigh hematoma.  We will  continue to monitor CBC and transfuse as needed.       Left thigh hematoma.  H and H was fluctuating.  We will continue with  supportive care.  There is no evidence of compartment syndrome at the  current time.  CT of abdomen and pelvis to rule out retroperitoneal bleed.     Elevated bilirubin, likely secondary to hemolysis due to hematoma.  We will  continue to monitor.  The patient does not have any acute complaint.     History of alcohol abuse, acute withdrawal.  Continue with Ativan.  At the  current time, the patient is arousable; however, somnolent.  Will decrease  Ativan dose as well as tramadol.     Gastrointestinal and deep venous thrombosis prophylaxis as needed.        Disposition:     Today's date: 02/28/2015  Admit Date: 02/25/2015 10:25 PM  Anticipated medical stability for discharge:Red - not tomorrow - estimated month/date: 03-02-2015  Service status: Inpatient: risk of morbidity and mortality, risk of progressive disease and risk of readmission  Reason for ongoing hospitalization: dt  Anticipated discharge needs: tbd     Lucilla Edin, MD

## 2015-02-28 NOTE — Plan of Care (Signed)
Report called to Raheal. Transfer to [facility] Rm 435.

## 2015-02-28 NOTE — Plan of Care (Addendum)
The patient has now received a total of 6U pRBC, 1U platelet, 9U FFP, Vitamin K IV 10mg  x1, PO vitamin K 5mg  x3 this admission.  Nevertheless, he has persistent anemia, thrombocytopenia, and INR elevation (latest Hb 8, PLT 58, INR 2.1).  Additionally, his L thigh hematoma is enlarging daily now spreading to L flank, he remains tachycardic despite now approximately a week out from his last drink, also with intermittent high grade fevers past 2 days.    Will transfer pt to the [facility] for closer monitoring. Check lactate and ABG.   Blood culture and repeat CT A/P are pending.  Will order PICC line for better IV access given ongoing transfusion needs.   Discussed pt's case with night float cross cover resident, tower 10 charge nurse, and pt's bedside nurse. I also notified [facility] Dr. Arnette Norris of this patient (not officially consulted, but made aware).

## 2015-02-28 NOTE — Plan of Care (Signed)
Problem: Health Promotion  Goal: Knowledge - disease process  Extent of understanding conveyed about a specific disease process.   Outcome: Progressing  Pt lethargic however will wake up and take his meds without difficulty. Hematoma marked at start of shift. Has expanded. Resident notified. Transfer to [facility] ordered. Transfused with 2 units FFPs. Elevated temp rectally. See flowsheet.  Medicated with Tylenol 325mg . Awaiting CT scan.     Problem: Safety  Goal: Patient will be free from injury during hospitalization  Outcome: Progressing  Fall precautions in place: bed in lowest position, fall mats in place, hourly rounding, bed alarm activated, 1:1 sitter.     Problem: Pain  Goal: Patient's pain/discomfort is manageable  Outcome: Progressing  Pt c/o pain in (L)hip with movement only. No medication given due to lethargy. Will continue to monitor and assess need.

## 2015-03-01 LAB — HEPATIC FUNCTION PANEL
ALT: 21 U/L (ref 0–55)
ALT: 26 U/L (ref 0–55)
AST (SGOT): 96 U/L — ABNORMAL HIGH (ref 5–34)
AST (SGOT): 109 U/L — ABNORMAL HIGH (ref 5–34)
Albumin/Globulin Ratio: 0.5 — ABNORMAL LOW (ref 0.9–2.2)
Albumin/Globulin Ratio: 0.5 — ABNORMAL LOW (ref 0.9–2.2)
Albumin: 2.1 g/dL — ABNORMAL LOW (ref 3.5–5.0)
Albumin: 2.3 g/dL — ABNORMAL LOW (ref 3.5–5.0)
Alkaline Phosphatase: 86 U/L (ref 38–106)
Alkaline Phosphatase: 94 U/L (ref 38–106)
Bilirubin Direct: 9.6 mg/dL — ABNORMAL HIGH (ref 0.0–0.5)
Bilirubin Direct: 11.9 mg/dL — ABNORMAL HIGH (ref 0.0–0.5)
Bilirubin Indirect: 7.1 mg/dL — ABNORMAL HIGH (ref 0.0–1.1)
Bilirubin Indirect: 10 mg/dL — ABNORMAL HIGH (ref 0.0–1.1)
Bilirubin, Total: 16.7 mg/dL — ABNORMAL HIGH (ref 0.2–1.2)
Bilirubin, Total: 21.9 mg/dL — ABNORMAL HIGH (ref 0.2–1.2)
Globulin: 4.1 g/dL — ABNORMAL HIGH (ref 2.0–3.6)
Globulin: 4.6 g/dL — ABNORMAL HIGH (ref 2.0–3.6)
Protein, Total: 6.2 g/dL (ref 6.0–8.3)
Protein, Total: 6.9 g/dL (ref 6.0–8.3)

## 2015-03-01 LAB — PREPARE RBC
Expiration Date: 201606162359
Expiration Date: 201606172359
Status: TRANSFUSED
Status: TRANSFUSED
UTYPE: O NEG
UTYPE: O NEG

## 2015-03-01 LAB — CBC WITH MANUAL DIFFERENTIAL
Atypical Lymphocytes %: 1 %
Atypical Lymphocytes Absolute: 0.1 10*3/uL — ABNORMAL HIGH
Band Neutrophils Absolute: 0.59 10*3/uL (ref 0.00–1.00)
Band Neutrophils: 6 %
Basophils Absolute Manual: 0.1 10*3/uL (ref 0.00–0.20)
Basophils Manual: 1 %
Cell Morphology: ABNORMAL — AB
Eosinophils Absolute Manual: 0.1 10*3/uL (ref 0.00–0.70)
Eosinophils Manual: 1 %
Hematocrit: 24.4 % — ABNORMAL LOW (ref 42.0–52.0)
Hgb: 8.4 g/dL — ABNORMAL LOW (ref 13.0–17.0)
Lymphocytes Absolute Manual: 1.56 10*3/uL (ref 0.50–4.40)
Lymphocytes Manual: 15 %
MCH: 32.6 pg — ABNORMAL HIGH (ref 28.0–32.0)
MCHC: 34.4 g/dL (ref 32.0–36.0)
MCV: 94.6 fL (ref 80.0–100.0)
MPV: 11.3 fL (ref 9.4–12.3)
Monocytes Absolute: 1.56 10*3/uL — ABNORMAL HIGH (ref 0.00–1.20)
Monocytes Manual: 15 %
Neutrophils Absolute Manual: 6.63 10*3/uL (ref 1.80–8.10)
Nucleated RBC: 0 /100 WBC (ref 0–1)
Plasma Cells Absolute: 0.1 10*3/uL — ABNORMAL HIGH
Plasmacytoid: 1 %
Platelet Estimate: DECREASED — AB
Platelets: 69 10*3/uL — ABNORMAL LOW (ref 140–400)
RBC: 2.58 10*6/uL — ABNORMAL LOW (ref 4.70–6.00)
RDW: 21 % — ABNORMAL HIGH (ref 12–15)
Segmented Neutrophils: 62 %
WBC: 10.73 10*3/uL (ref 3.50–10.80)

## 2015-03-01 LAB — CBC
Hematocrit: 17.9 % — ABNORMAL LOW (ref 42.0–52.0)
Hgb: 6.2 g/dL — ABNORMAL LOW (ref 13.0–17.0)
MCH: 32.8 pg — ABNORMAL HIGH (ref 28.0–32.0)
MCHC: 34.6 g/dL (ref 32.0–36.0)
MCV: 94.7 fL (ref 80.0–100.0)
MPV: 10.7 fL (ref 9.4–12.3)
Nucleated RBC: 0 /100 WBC (ref 0–1)
Platelets: 58 10*3/uL — ABNORMAL LOW (ref 140–400)
RBC: 1.89 10*6/uL — ABNORMAL LOW (ref 4.70–6.00)
RDW: 23 % — ABNORMAL HIGH (ref 12–15)
WBC: 8.49 10*3/uL (ref 3.50–10.80)

## 2015-03-01 LAB — BASIC METABOLIC PANEL
BUN: 23 mg/dL (ref 9.0–28.0)
BUN: 23 mg/dL (ref 9.0–28.0)
CO2: 23 mEq/L (ref 22–29)
CO2: 23 mEq/L (ref 22–29)
Calcium: 7.9 mg/dL — ABNORMAL LOW (ref 8.5–10.5)
Calcium: 8.1 mg/dL — ABNORMAL LOW (ref 8.5–10.5)
Chloride: 107 mEq/L (ref 100–111)
Chloride: 106 mEq/L (ref 100–111)
Creatinine: 1.1 mg/dL (ref 0.7–1.3)
Creatinine: 1 mg/dL (ref 0.7–1.3)
Glucose: 91 mg/dL (ref 70–100)
Glucose: 95 mg/dL (ref 70–100)
Potassium: 3.4 mEq/L — ABNORMAL LOW (ref 3.5–5.1)
Potassium: 3.7 mEq/L (ref 3.5–5.1)
Sodium: 140 mEq/L (ref 136–145)
Sodium: 138 mEq/L (ref 136–145)

## 2015-03-01 LAB — PT/INR
PT INR: 2.2 — ABNORMAL HIGH (ref 0.9–1.1)
PT: 23.9 s — ABNORMAL HIGH (ref 12.6–15.0)

## 2015-03-01 LAB — PT AND APTT
PT INR: 2 — ABNORMAL HIGH (ref 0.9–1.1)
PT: 22.6 s — ABNORMAL HIGH (ref 12.6–15.0)
PTT: 38 s — ABNORMAL HIGH (ref 23–37)

## 2015-03-01 LAB — IHS D-DIMER: D-Dimer: >20 ug/mL FEU — ABNORMAL HIGH (ref 0.00–0.51)

## 2015-03-01 LAB — APTT: PTT: 43 s — ABNORMAL HIGH (ref 23–37)

## 2015-03-01 LAB — HEMOGLOBIN AND HEMATOCRIT, BLOOD
Hematocrit: 20.7 % — ABNORMAL LOW (ref 42.0–52.0)
Hgb: 7.1 g/dL — ABNORMAL LOW (ref 13.0–17.0)

## 2015-03-01 LAB — TYPE AND SCREEN
AB Screen Gel: NEGATIVE
ABO Rh: O POS

## 2015-03-01 LAB — LACTATE DEHYDROGENASE: LDH: 252 U/L (ref 125–331)

## 2015-03-01 LAB — GLUCOSE, BODY FLUID

## 2015-03-01 LAB — HAPTOGLOBIN: Haptoglobin: <8 mg/dL — ABNORMAL LOW (ref 14–258)

## 2015-03-01 LAB — GFR
EGFR: >60
EGFR: >60

## 2015-03-01 LAB — BODY FLUID PH

## 2015-03-01 MED ORDER — LACTULOSE 10 GM/15ML PO SOLN
30.0000 g | Freq: Four times a day (QID) | ORAL | Status: DC
Start: 2015-03-01 — End: 2015-03-09
  Administered 2015-03-01 – 2015-03-08 (×17): 30 g via ORAL
  Filled 2015-03-01: qty 60
  Filled 2015-03-01: qty 30
  Filled 2015-03-01 (×17): qty 60

## 2015-03-01 MED ORDER — CIPROFLOXACIN IN D5W 400 MG/200ML IV SOLN
400.0000 mg | Freq: Two times a day (BID) | INTRAVENOUS | Status: DC
Start: 2015-03-01 — End: 2015-03-04
  Administered 2015-03-01 – 2015-03-03 (×6): 400 mg via INTRAVENOUS
  Filled 2015-03-01 (×7): qty 200

## 2015-03-01 MED ORDER — PHYTONADIONE 5 MG PO TABS
10.0000 mg | ORAL_TABLET | Freq: Once | ORAL | Status: AC
Start: 2015-03-01 — End: 2015-03-01
  Administered 2015-03-01: 10 mg via ORAL
  Filled 2015-03-01 (×2): qty 2

## 2015-03-01 MED ORDER — SODIUM CHLORIDE 0.9 % IV SOLN
INTRAVENOUS | Status: DC | PRN
Start: 2015-03-01 — End: 2015-03-10

## 2015-03-01 MED ORDER — ALBUMIN HUMAN 25 % IV SOLN
25.0000 g | Freq: Once | INTRAVENOUS | Status: DC
Start: 2015-03-01 — End: 2015-03-05

## 2015-03-01 MED ORDER — POTASSIUM CHLORIDE CRYS ER 20 MEQ PO TBCR
40.0000 meq | EXTENDED_RELEASE_TABLET | Freq: Once | ORAL | Status: AC
Start: 2015-03-01 — End: 2015-03-01
  Administered 2015-03-01: 40 meq via ORAL
  Filled 2015-03-01: qty 2

## 2015-03-01 NOTE — Progress Notes (Signed)
[facility]  PROGRESS NOTE  [facility] call: [phone], [phone]  [facility] call: [phone]  After hours call [phone]    Date Time: 03/01/2015 2:42 PM  Patient Name: Gregory [name], Gregory [name]      Chief Complaint:   Anemia, cirrhosis     Assessment and Plan:   Assessment: 39yoM with etoh abuse presented to [facility] ED with abdominal pain, recent fall and found to have anemia and jaundice. CT with large left hip hematoma, ascites. U/S with ascites.  1. Anemia in setting of worsening large left hip hematoma, likely main source.No report of overt GI bleeding at this time.  2. Abnormal LFTs, suspect alcoholic hepatitis and underlying known cirrhosis.DF is 55. MELD 27 (6/12). LFTs improving. Hepatitis BsAg, Hep C Ab neg, Ceruloplasmin 15, iron/TIBC normal; ferritin 1988. AFP 7.1. ANA, mitochondrial Ab negative  3. Alcohol abuse    Plan:   1. Recommend diagnostic paracentesis and please check cell count, gram stain, culture, cytology, and albumin  2. Obtain infectious work-up (i.e. UA/UCx, BCxx2, CXR). If negative, we may consider prednisolone; however, do not recommend prednisolone before a negative infectious work-up is obtained  3. PPI  4. Follow H&H and continue to monitor for overt GI bleeding. 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 Follow LFTs/INR daily  6. CIWA protocol/CATS  7. Lactulose TID, titrate to 2-3 soft stools daily  8.Xifaxan 550mg  BID  9. Recommend broad-spectrum antibiotics  10. Encourage nutrition -- calorie count.Nutrition following    Discussed with Dr. Janece Canterbury     Subjective:   Pt transferred to [facility] overnight d/t fever, tachycardia. Pt reports feeling subjective fevers, palpitations and intermittent confusion. He did not have a bowel movement yesterday. No GI bleeding, melena, hematochezia, N/V.      Medications:     Current Facility-Administered Medications   Medication Dose Route Frequency   . albumin  human  25 g Intravenous Once   . ciprofloxacin  400 mg Intravenous Q12H SCH   . folic acid  1 mg Oral Daily   . lactulose  30 g Oral 4 times per day   . multivitamin  1 tablet Oral Daily   . pantoprazole  40 mg Intravenous BID   . rifaximin  550 mg Oral BID   . thiamine  100 mg Oral Daily       Review of Systems:   Complete 12 Point ROS negative or normal except those mentioned in HPI above.    Physical Exam:     Filed Vitals:    03/01/15 1400   BP: 137/88   Pulse: 109   Temp:    Resp: 17   SpO2: 97%   BP 137/88 mmHg  Pulse 109  Temp(Src) 98.7 F (37.1 C) (Oral)  Resp 17  Ht 1.93 m (6\' 4" )  Wt 81.647 kg (180 lb)  BMI 21.92 kg/m2  SpO2 97%    General appearance: Chronically-ill appearing, appears stated age and in NAD, tremulous  Eyes: Sclera anicteric, pink conjunctivae, no ptosis  ENMT: mucous membranes moist, nose and ears appear normal.  Oropharynx clear.  Chest: Non labored respirations, no audible wheezing, no clubbing or cyanosis  CV:  Tachycardic, + B/L ankle edema  Abdomen: somewhat firm, distended, tender throughout, no masses or organomegaly  Skin: +Jaundice, normal turgor, no rashes, no suspicious skin lesions noted  Neuro: CN II-XII grossly intact.  No gross movement disorders noted. + asterixis  Mental status:  Appropriate affect, alert and oriented x 3    Labs:     Recent Labs      03/01/15   0930  03/01/15   0010   WBC  10.73  8.49   HGB  8.4*  6.2*   HEMATOCRIT  24.4*  17.9*   PLATELETS  69*  58*   MCV  94.6  94.7       Recent Labs      03/01/15   0931  03/01/15   0010   SODIUM  138  140   POTASSIUM  3.7  3.4*   CHLORIDE  106  107   CO2  23  23   BUN  23.0  23.0   CREATININE  1.0  1.1   GLUCOSE  95  91   CALCIUM  8.1*  7.9*       Recent Labs      03/01/15   0931  03/01/15   0010   AST (SGOT)  109*  96*   ALT  26  21   ALKALINE PHOSPHATASE  94  86   BILIRUBIN, TOTAL  21.9*  16.7*   BILIRUBIN, DIRECT  11.9*  9.6*   PROTEIN, TOTAL  6.9  6.2   ALBUMIN  2.3*  2.1*       Recent Labs      03/01/15    0930  03/01/15   0010   PTT  38*  43*   PT  22.6*  23.9*   PT INR  2.0*  2.2*        Radiology:   Radiological Procedure reviewed:    [place] RUQ 02/23/15:   1. The liver demonstrates a coarsened echotexture with a mildly nodular contour. There is no intrahepatic biliary ductal dilatation or mass.  2. A moderate amount of mobile sludge is identified within a distended gallbladder. There is no gallstone. The gallbladder wall is edematous and may be partially due to the surrounding ascites. If there is high clinical concern of acute cholecystitis a HIDA scan is recommended.  3. There is no hydronephrosis within the right kidney. There is a 1.0 cm benign appearing upper pole cyst.  4. Ascites. The remainder is as above.    CT A/P without contrast 02/28/15:  1. Stable large intramuscular hematoma surrounding left hip and upper  thigh involving left gluteus musculature and lateral abductors.  2. No retroperitoneal hemorrhage. Unchanged large ascites.

## 2015-03-01 NOTE — Progress Note - Problem Oriented Charting Notewrit (Addendum)
MEDICINE PROGRESS NOTE    Date Time: 03/01/2015 11:33 AM  Patient Name: Gregory [name]  Attending Physician: Lucilla Edin, MD       Assessment   [phone] gentleman with history of alcoholic cirrhosis, who was transferred from [facility] ED on 6/5 with depressed mental status and found to have anemia likely due to large L thigh hematoma, as well as ascites and biliary sludge.  Persistent anemia, thrombocytopenia, and INR elevation, requiring blood product transfusion.     Plan   Suspected SBP, started on Cipro IV and lactulose in light of change of mental status suspecting hepatic encephalopathy.    Fever of 101.9 etiology is unknown, culture negative for any growth thus far.     Anemia secondary to acute blood loss due to left thigh hematoma. We will  continue to monitor CBC and transfuse as needed.     Left thigh hematoma. H and H was fluctuating. We will continue with  supportive care. There is no evidence of compartment syndrome at the  current time. CT of abdomen and pelvis to rule out retroperitoneal bleed.    Elevated bilirubin, likely secondary to hemolysis due to hematoma. We will  continue to monitor. The patient does not have any acute complaint.    History of alcohol abuse, acute withdrawal. Continue with Ativan. At the  current time, the patient is arousable; however, somnolent. Will decrease  Ativan dose as well as tramadol.    Safety Checklist:     DVT prophylaxis:  CHEST guideline (See page e199S) Mechanical   Foley:  [place] Rn Foley protocol Not present *Condom cath   IVs:  Peripheral IV   PT/OT: Ordered   Daily CBC & or Chem ordered:  SHM/ABIM guidelines (see #5) Yes, due to clinical and lab instability   Reference for approximate charges of common labs: CBC auto diff - $76  BMP - $99  Mg - $79    Disposition:     Anticipated discharge needs: TBD    Subjective     CC: anemia     24 hour events: Intermittent fever to 38.6 overnight.  Received total 2U FFP and vitamin K past 24h.  Agitated overnight, trying to climb out of bed.    Subjective: Very sleepy this morning and unable to obtain full ROS.  Knows his name and hospital and complains of ongoing pain, but cannot state anything else.     Review of Systems:     As above    Physical Exam:     VITAL SIGNS PHYSICAL EXAM   Temp:  [97 F (36.1 C)-101.9 F (38.8 C)] 98.2 F (36.8 C)  Heart Rate:  [98-113] 104  Resp Rate:  [14-20] 18  BP: (108-146)/(61-89) 123/75 mmHg  SpO2: room air  Body mass index is 21.92 kg/(m^2).      Intake/Output Summary (Last 24 hours) at 03/01/15 1133  Last data filed at 03/01/15 0715   Gross per 24 hour   Intake 318.75 ml   Output      0 ml   Net 318.75 ml    GEN: thin jaundiced ill appearing gentleman  HEENT: minimal gum bleeding  CV: RRR, no murmurs, no JVD  PULM: ctab, no wheezes/crackles  ABDO: soft, non-tender, increased abdominal distention, no guarding/rebound  EXTREMITIES: no lower/upper extremity edema, L lateral thigh with enlarging hematoma and mild swelling and significant tenderness, DP pulses dopplerable bilaterally and warm to touch  NEURO: awake and alert, alert and oriented to person and place only,  able to follow simple commands       Meds:      Scheduled Meds: PRN Meds:        folic acid 1 mg Oral Daily   lactulose 20 g Oral BID   multivitamin 1 tablet Oral Daily   pantoprazole 40 mg Intravenous BID   rifaximin 550 mg Oral BID   thiamine 100 mg Oral Daily        sodium chloride  PRN   sodium chloride  PRN   sodium chloride  PRN   sodium chloride  PRN   acetaminophen 325 mg Q4H PRN   LORazepam 2-4 mg PRN   naloxone 0.2 mg PRN   ondansetron 4 mg Q8H PRN   Or     ondansetron 4 mg Q8H PRN   traMADol 25 mg Q12H PRN           Labs:     Labs (last 72 hours):      Recent Labs  Lab 03/01/15  0930 03/01/15  0010   WBC 10.73 8.49   HGB 8.4* 6.2*   HEMATOCRIT 24.4* 17.9*   PLATELETS 69* 58*         Recent Labs  Lab 03/01/15  0930 03/01/15  0010   PT 22.6* 23.9*   PT INR 2.0* 2.2*   PTT 38* 43*      Recent  Labs  Lab 03/01/15  0931 03/01/15  0010   SODIUM 138 140   POTASSIUM 3.7 3.4*   CHLORIDE 106 107   CO2 23 23   BUN 23.0 23.0   CREATININE 1.0 1.1   CALCIUM 8.1* 7.9*   ALBUMIN 2.3* 2.1*   PROTEIN, TOTAL 6.9 6.2   BILIRUBIN, TOTAL 21.9* 16.7*   ALKALINE PHOSPHATASE 94 86   ALT 26 21   AST (SGOT) 109* 96*   GLUCOSE 95 91          Microbiology, reviewed and significant for:  MRSAneg    Imaging, reviewed and significant for:  Ct Abdomen Pelvis Wo Iv/ Wo Po Cont    02/28/2015    1. Stable large intramuscular hematoma surrounding left hip and upper thigh involving left gluteus musculature and lateral abductors. 2. No retroperitoneal hemorrhage. Unchanged large ascites.  Charlott Rakes, MD  02/28/2015 9:41 PM       Lucilla Edin, MD

## 2015-03-01 NOTE — Plan of Care (Addendum)
AxOx4, calm and cooperative, drowsy throughout day, easy to arouse, follows commands, Q4 neurovascular checks. Weaned from 2L NC- RA, SPO2 97%. Afebrile, ST low 100s, BPs 120s-140s/70s-80s. 1 small BM, brown. Condom cath in place. Paracentesis to be done today by [facility], albumin to be given after, med on hold. In AM, multiple labs drawn, H+H increased from 6.2-8.4 post 2 units PRBC on night shift, d-dimer >20, resident paged and made aware. Latest CBC 7.1 + 20.7, night nurse Kim aware. CBC Q8 next one at 0200 AM. Pt left leg extremely bruised and swollen. Hourly rounding and safety checks done.

## 2015-03-02 LAB — URINALYSIS WITH MICROSCOPIC
Blood, UA: NEGATIVE
Glucose, UA: NEGATIVE
Ketones UA: NEGATIVE
Leukocyte Esterase, UA: NEGATIVE
Nitrite, UA: NEGATIVE
Protein, UR: 30 — AB
Specific Gravity UA: 1.022 (ref 1.001–1.035)
Urine pH: 6 (ref 5.0–8.0)
Urobilinogen, UA: >12 mg/dL — ABNORMAL HIGH (ref 0.2–2.0)

## 2015-03-02 LAB — HEPATIC FUNCTION PANEL
ALT: 27 U/L (ref 0–55)
AST (SGOT): 116 U/L — ABNORMAL HIGH (ref 5–34)
Albumin/Globulin Ratio: 0.5 — ABNORMAL LOW (ref 0.9–2.2)
Albumin: 2.2 g/dL — ABNORMAL LOW (ref 3.5–5.0)
Alkaline Phosphatase: 90 U/L (ref 38–106)
Bilirubin Direct: 14.9 mg/dL — ABNORMAL HIGH (ref 0.0–0.5)
Bilirubin Indirect: 9.1 mg/dL — ABNORMAL HIGH (ref 0.0–1.1)
Bilirubin, Total: 24 mg/dL — ABNORMAL HIGH (ref 0.2–1.2)
Globulin: 4.8 g/dL — ABNORMAL HIGH (ref 2.0–3.6)
Protein, Total: 7 g/dL (ref 6.0–8.3)

## 2015-03-02 LAB — CBC
Hematocrit: 23 % — ABNORMAL LOW (ref 42.0–52.0)
Hematocrit: 22.9 % — ABNORMAL LOW (ref 42.0–52.0)
Hematocrit: 22.9 % — ABNORMAL LOW (ref 42.0–52.0)
Hgb: 8.1 g/dL — ABNORMAL LOW (ref 13.0–17.0)
Hgb: 7.9 g/dL — ABNORMAL LOW (ref 13.0–17.0)
Hgb: 8.1 g/dL — ABNORMAL LOW (ref 13.0–17.0)
MCH: 33.3 pg — ABNORMAL HIGH (ref 28.0–32.0)
MCH: 32.8 pg — ABNORMAL HIGH (ref 28.0–32.0)
MCH: 34.2 pg — ABNORMAL HIGH (ref 28.0–32.0)
MCHC: 35.2 g/dL (ref 32.0–36.0)
MCHC: 34.5 g/dL (ref 32.0–36.0)
MCHC: 35.4 g/dL (ref 32.0–36.0)
MCV: 94.7 fL (ref 80.0–100.0)
MCV: 95 fL (ref 80.0–100.0)
MCV: 96.6 fL (ref 80.0–100.0)
MPV: 11.1 fL (ref 9.4–12.3)
MPV: 10.8 fL (ref 9.4–12.3)
MPV: 10.9 fL (ref 9.4–12.3)
Nucleated RBC: 1 /100 WBC (ref 0–1)
Nucleated RBC: 1 /100 WBC (ref 0–1)
Nucleated RBC: 1 /100 WBC (ref 0–1)
Platelets: 75 10*3/uL — ABNORMAL LOW (ref 140–400)
Platelets: 75 10*3/uL — ABNORMAL LOW (ref 140–400)
Platelets: 85 10*3/uL — ABNORMAL LOW (ref 140–400)
RBC: 2.43 10*6/uL — ABNORMAL LOW (ref 4.70–6.00)
RBC: 2.41 10*6/uL — ABNORMAL LOW (ref 4.70–6.00)
RBC: 2.37 10*6/uL — ABNORMAL LOW (ref 4.70–6.00)
RDW: 22 % — ABNORMAL HIGH (ref 12–15)
RDW: 23 % — ABNORMAL HIGH (ref 12–15)
RDW: 24 % — ABNORMAL HIGH (ref 12–15)
WBC: 11.36 10*3/uL — ABNORMAL HIGH (ref 3.50–10.80)
WBC: 12.3 10*3/uL — ABNORMAL HIGH (ref 3.50–10.80)
WBC: 13.37 10*3/uL — ABNORMAL HIGH (ref 3.50–10.80)

## 2015-03-02 LAB — GFR: EGFR: >60

## 2015-03-02 LAB — PT AND APTT
PT INR: 2.2 — ABNORMAL HIGH (ref 0.9–1.1)
PT: 24.4 s — ABNORMAL HIGH (ref 12.6–15.0)
PTT: 42 s — ABNORMAL HIGH (ref 23–37)

## 2015-03-02 LAB — BASIC METABOLIC PANEL
BUN: 24 mg/dL (ref 9.0–28.0)
CO2: 22 mEq/L (ref 22–29)
Calcium: 8.2 mg/dL — ABNORMAL LOW (ref 8.5–10.5)
Chloride: 107 mEq/L (ref 100–111)
Creatinine: 1 mg/dL (ref 0.7–1.3)
Glucose: 100 mg/dL (ref 70–100)
Potassium: 3.4 mEq/L — ABNORMAL LOW (ref 3.5–5.1)
Sodium: 138 mEq/L (ref 136–145)

## 2015-03-02 LAB — BODY FLUID CELL COUNT

## 2015-03-02 MED ORDER — POTASSIUM CHLORIDE CRYS ER 20 MEQ PO TBCR
40.0000 meq | EXTENDED_RELEASE_TABLET | Freq: Once | ORAL | Status: AC
Start: 2015-03-02 — End: 2015-03-02
  Administered 2015-03-02: 40 meq via ORAL
  Filled 2015-03-02: qty 2

## 2015-03-02 MED ORDER — PHYTONADIONE 5 MG PO TABS
5.0000 mg | ORAL_TABLET | Freq: Once | ORAL | Status: AC
Start: 2015-03-02 — End: 2015-03-02
  Administered 2015-03-02: 5 mg via ORAL
  Filled 2015-03-02: qty 1

## 2015-03-02 NOTE — Progress Notes (Addendum)
[facility] consulted for bedside diagnostic paracentesis.  INR this morning >2.0, procedure to be deferred per [facility] procedure guidelines.  This was relayed to the medicine team resident.    Lucious Groves, MSN, ACNP-BC

## 2015-03-02 NOTE — Progress Note - Problem Oriented Charting Notewrit (Signed)
MEDICINE PROGRESS NOTE    Date Time: 03/02/2015 2:32 PM  Patient Name: Gregory [name]  Attending Physician: Lucilla Edin, MD       Assessment   [phone] gentleman with history of alcoholic cirrhosis, who was transferred from [facility] ED on 6/5 with depressed mental status and found to have anemia likely due to large L thigh hematoma, as well as ascites and biliary sludge.  Persistent anemia, thrombocytopenia, and INR elevation, requiring blood product transfusion.     Plan   1.  Suspected spontaneous bacterial peritonitis.  Continue with IV Cipro.   Abdominal exam shows distension and tender to palpation.  Paracentesis  requested, however, [facility] declined the procedure due to elevated INR.  INR  is 2.2.  Vitamin K given.   2.  Febrile episode.  Etiology is unknown.  I am suspecting likely  secondary spontaneous bacterial peritonitis.  3.  Left eye hematoma.  Continue with supportive care.  Pain management  will monitor CBC.   4.  Elevated bilirubin likely secondary to hemolysis due to hematoma.   5.  History of alcohol abuse.  Currently on CIWA protocol.      Based on examination, the patient is awake, alert, answers questions  appropriately, and acquires about the plan of management appropriately as  well.        Safety Checklist:     DVT prophylaxis:  CHEST guideline (See page e199S) Mechanical   Foley:  [place] Rn Foley protocol Not present *Condom cath   IVs:  Peripheral IV   PT/OT: Ordered   Daily CBC & or Chem ordered:  SHM/ABIM guidelines (see #5) Yes, due to clinical and lab instability   Reference for approximate charges of common labs: CBC auto diff - $76  BMP - $99  Mg - $79    Disposition:     Anticipated discharge needs: TBD    Subjective     CC: anemia     24 hour events: Intermittent fever to 38.6 overnight.  Received total 2U FFP and vitamin K past 24h. Agitated overnight, trying to climb out of bed.    Subjective: awake and alert, answers questions appropriately.      Review of Systems:     As  above    Physical Exam:     VITAL SIGNS PHYSICAL EXAM   Temp:  [97.9 F (36.6 C)-99.7 F (37.6 C)] 98 F (36.7 C)  Heart Rate:  [100-116] 101  Resp Rate:  [14-25] 14  BP: (116-155)/(70-86) 116/74 mmHg  SpO2: room air  Body mass index is 21.92 kg/(m^2).      Intake/Output Summary (Last 24 hours) at 03/02/15 1432  Last data filed at 03/02/15 1004   Gross per 24 hour   Intake    200 ml   Output    450 ml   Net   -250 ml    GEN: thin jaundiced ill appearing gentleman  HEENT: minimal gum bleeding  CV: RRR, no murmurs, no JVD  PULM: ctab, no wheezes/crackles  ABDO: soft, non-tender, increased abdominal distention, no guarding/rebound  EXTREMITIES: no lower/upper extremity edema, L lateral thigh with enlarging hematoma and mild swelling and significant tenderness, DP pulses dopplerable bilaterally and warm to touch  NEURO: awake and alert, alert and oriented to person and place only, able to follow simple commands       Meds:      Scheduled Meds: PRN Meds:        albumin human 25 g Intravenous Once  ciprofloxacin 400 mg Intravenous Q12H SCH   folic acid 1 mg Oral Daily   lactulose 30 g Oral 4 times per day   multivitamin 1 tablet Oral Daily   pantoprazole 40 mg Intravenous BID   rifaximin 550 mg Oral BID   thiamine 100 mg Oral Daily        sodium chloride  PRN   sodium chloride  PRN   sodium chloride  PRN   sodium chloride  PRN   acetaminophen 325 mg Q4H PRN   LORazepam 2-4 mg PRN   naloxone 0.2 mg PRN   ondansetron 4 mg Q8H PRN   Or     ondansetron 4 mg Q8H PRN   traMADol 25 mg Q12H PRN           Labs:     Labs (last 72 hours):      Recent Labs  Lab 03/02/15  1138 03/02/15  0312   WBC 12.30* 11.36*   HGB 7.9* 8.1*   HEMATOCRIT 22.9* 23.0*   PLATELETS 75* 75*         Recent Labs  Lab 03/02/15  0312 03/01/15  0930   PT 24.4* 22.6*   PT INR 2.2* 2.0*   PTT 42* 38*      Recent Labs  Lab 03/02/15  0312 03/01/15  0931   SODIUM 138 138   POTASSIUM 3.4* 3.7   CHLORIDE 107 106   CO2 22 23   BUN 24.0 23.0   CREATININE 1.0 1.0    CALCIUM 8.2* 8.1*   ALBUMIN 2.2* 2.3*   PROTEIN, TOTAL 7.0 6.9   BILIRUBIN, TOTAL 24.0* 21.9*   ALKALINE PHOSPHATASE 90 94   ALT 27 26   AST (SGOT) 116* 109*   GLUCOSE 100 95          Microbiology, reviewed and significant for:  MRSAneg    Imaging, reviewed and significant for:  No results found.    Lucilla Edin, MD

## 2015-03-02 NOTE — Progress Notes (Signed)
Pt axox4. vss afebrile. Pt denied sob/doe/n/v/chest pain. Pt complained of pain in abdomen and left flank hip and thigh area from hematoma with tylenol given x2 dose and tramadol x1 dose. Pt anxious at times, emotional support provided. Pt incontinent of orange urine and loose stool x3 overnight. Pt changed and skin care provided. See Epic, Flowsheets and Mar for charting. AM blood sent at 0300 with H/H of 8.1/23.0 next H/H due at 11am.  Continue to support plan of care and maintain patient safety with call bell within reach, fall mat down, and bed alarm activated.

## 2015-03-02 NOTE — Plan of Care (Signed)
Problem: Health Promotion  Goal: Knowledge - disease process  Extent of understanding conveyed about a specific disease process.   Outcome: Progressing    Problem: Safety  Goal: Patient will be free from injury during hospitalization  Outcome: Progressing    Problem: Pain  Goal: Patient's pain/discomfort is manageable  Outcome: Progressing    Problem: Psychosocial and Spiritual Needs  Goal: Demonstrates ability to cope with hospitalization/illness  Outcome: Progressing    Problem: Potential for Compromised Skin Integrity  Goal: Skin integrity is maintained or improved  Assess and monitor skin integrity. Identify patients at risk for skin breakdown on admission and per policy. Collaborate with interdisciplinary team and initiate plans and interventions as needed.   Outcome: Progressing    Problem: Urinary Incontinence  Goal: Perineal skin integrity is maintained or improved  Assess genitourinary system, perineal skin, labs (urinalysis), and history of incontinence to include past management, aggravating, and alleviating factors. Collaborate with interdisciplinary team and initiate plans and interventions as needed.   Outcome: Progressing    Problem: Moderate/High Fall Risk Score >5  Goal: Patient will remain free of falls  Outcome: Progressing

## 2015-03-02 NOTE — Progress Notes (Signed)
Per PT note pt is recommended for home health PT.  Per OT note pt is recommended for SNF.  Writer spoke with Trula Ore from PT/OT scheduling at [phone].  Per Trula Ore, patient needs new PT/OT orders and she will notify assigned PT and OT.  This Clinical research associate notified CCM Christine regarding pt's need for new PT/OT orders.  Will continue to monitor for d/c planning needs.      Herma Carson, RN, [facility] Discharge Planner  [facility]  [phone]

## 2015-03-02 NOTE — Plan of Care (Signed)
Problem: Pain  Goal: Patient's pain/discomfort is manageable  Outcome: Progressing  Pt A/O x4, some drowsiness intermittently throughout shift. Able to answer questions correctly, MAE, follows commands. C/o pain in head and L side 8/10, given tylenol x1 and tramadol x1 with good effect. RA, no desats. ST 100s, otherwise other VSS. 2 large liquid BMs this shift. Turbid brown urine via urinal or incontinence. Sent for UA, results relayed to MD Janece Canterbury. Will continue to monitor and maintain safety per unit protocol.

## 2015-03-03 LAB — GFR: EGFR: >60

## 2015-03-03 LAB — CBC
Hematocrit: 22.5 % — ABNORMAL LOW (ref 42.0–52.0)
Hgb: 7.9 g/dL — ABNORMAL LOW (ref 13.0–17.0)
MCH: 33.6 pg — ABNORMAL HIGH (ref 28.0–32.0)
MCHC: 35.1 g/dL (ref 32.0–36.0)
MCV: 95.7 fL (ref 80.0–100.0)
MPV: 10.8 fL (ref 9.4–12.3)
Nucleated RBC: 0 /100 WBC (ref 0–1)
Platelets: 90 10*3/uL — ABNORMAL LOW (ref 140–400)
RBC: 2.35 10*6/uL — ABNORMAL LOW (ref 4.70–6.00)
RDW: 24 % — ABNORMAL HIGH (ref 12–15)
WBC: 11.33 10*3/uL — ABNORMAL HIGH (ref 3.50–10.80)

## 2015-03-03 LAB — BASIC METABOLIC PANEL
BUN: 24 mg/dL (ref 9.0–28.0)
CO2: 22 mEq/L (ref 22–29)
Calcium: 8 mg/dL — ABNORMAL LOW (ref 8.5–10.5)
Chloride: 105 mEq/L (ref 100–111)
Creatinine: 1 mg/dL (ref 0.7–1.3)
Glucose: 81 mg/dL (ref 70–100)
Potassium: 3.7 mEq/L (ref 3.5–5.1)
Sodium: 135 mEq/L — ABNORMAL LOW (ref 136–145)

## 2015-03-03 LAB — HEPATIC FUNCTION PANEL
ALT: 25 U/L (ref 0–55)
AST (SGOT): 108 U/L — ABNORMAL HIGH (ref 5–34)
Albumin/Globulin Ratio: 0.4 — ABNORMAL LOW (ref 0.9–2.2)
Albumin: 2 g/dL — ABNORMAL LOW (ref 3.5–5.0)
Alkaline Phosphatase: 86 U/L (ref 38–106)
Bilirubin Direct: 14.4 mg/dL — ABNORMAL HIGH (ref 0.0–0.5)
Bilirubin Indirect: 8.3 mg/dL — ABNORMAL HIGH (ref 0.0–1.1)
Bilirubin, Total: 22.7 mg/dL — ABNORMAL HIGH (ref 0.2–1.2)
Globulin: 4.6 g/dL — ABNORMAL HIGH (ref 2.0–3.6)
Protein, Total: 6.6 g/dL (ref 6.0–8.3)

## 2015-03-03 LAB — PT AND APTT
PT INR: 2.5 — ABNORMAL HIGH (ref 0.9–1.1)
PT: 26.4 s — ABNORMAL HIGH (ref 12.6–15.0)
PTT: 43 s — ABNORMAL HIGH (ref 23–37)

## 2015-03-03 MED ORDER — POTASSIUM CHLORIDE CRYS ER 20 MEQ PO TBCR
40.0000 meq | EXTENDED_RELEASE_TABLET | Freq: Once | ORAL | Status: AC
Start: 2015-03-03 — End: 2015-03-03
  Administered 2015-03-03: 40 meq via ORAL
  Filled 2015-03-03: qty 2

## 2015-03-03 MED ORDER — TRAMADOL HCL 50 MG PO TABS
25.0000 mg | ORAL_TABLET | Freq: Three times a day (TID) | ORAL | Status: DC | PRN
Start: 2015-03-03 — End: 2015-03-03
  Administered 2015-03-03: 25 mg via ORAL
  Filled 2015-03-03: qty 1

## 2015-03-03 MED ORDER — METOCLOPRAMIDE HCL 5 MG/ML IJ SOLN
10.0000 mg | Freq: Four times a day (QID) | INTRAMUSCULAR | Status: DC | PRN
Start: 2015-03-03 — End: 2015-03-10

## 2015-03-03 MED ORDER — TRAMADOL HCL 50 MG PO TABS
50.0000 mg | ORAL_TABLET | Freq: Three times a day (TID) | ORAL | Status: DC | PRN
Start: 2015-03-03 — End: 2015-03-10
  Administered 2015-03-03 – 2015-03-10 (×16): 50 mg via ORAL
  Filled 2015-03-03 (×15): qty 1

## 2015-03-03 MED ORDER — TRAMADOL HCL 50 MG PO TABS
25.0000 mg | ORAL_TABLET | Freq: Four times a day (QID) | ORAL | Status: DC | PRN
Start: 2015-03-03 — End: 2015-03-03
  Administered 2015-03-03: 50 mg via ORAL
  Filled 2015-03-03: qty 1

## 2015-03-03 NOTE — OT Plan of Care Note (Signed)
[facility]   Occupational Therapy Treatment     Patient: Gregory [name]    MRN#: [id number]   Unit: [facility] TOWER 4  Bed: V409/W119.14      Discharge Recommendations:   Discharge Recommendation: SNF   DME Recommended for Discharge: Wheelchair-manual, Front wheel walker, BSC, Tub transfer bench    If SNF recommended discharge disposition is not available, patient will need max hands-on assist for completion of ADLs, functional mobility, safety awareness, above-listed equipment and home OT.     Assessment:   Patient was received in bed, with SCDs, telemetry, and peripheral IV in place.  He was alert and oriented and willing to participate in therapy.  Worked on balance at EOB and UE strength, (1x10 shoulder flexion and 1x10 shoulder abduction bilaterally).  Performed basic grooming tasks seated bedside with setup/supervision (wash face/body with washcloth, comb hair).  Sit to stand transfer min A x2, patient was fearful and complained of some pain in L hip and L lower abdomen. Ambulated laterally to R side with min Ax2 and use of walker.Fatigues very easily.     Treatment Activities: ADL training, therapeutic exercise, transfer/mobility training    Educated the patient to role of occupational therapy, plan of care, goals of therapy and HEP, safety with mobility and ADLs.    Plan:    OT Frequency Recommended: 2-3x/wk     Continue plan of care.       Precautions and Contraindications:   Fall risk    Updated Medical Status/Imaging/Labs:  Reviewed     Subjective: Feeling better today.   Patient's medical condition is appropriate for Occupational Therapy intervention at this time.  Patient is agreeable to participation in the therapy session. Nursing clears patient for therapy.    Pain:   Scale: enhanced during mobility  Location: L hip and lower abdomen      Objective:   Patient is in bed with SCD's, telemetry and peripheral IV in place.      Cognition  Alert and oriented, appropriate  verbal responses, flat affect.    Functional Mobility  Rolling:  Supervision with physical/verbal cues  Supine to Sit: minA with physical/verbal cues, especially to lift/move LLE off of bed  Sit to Stand: modA x2  Transfers: lateral ambulation toward head of bed (to R) with min Ax2 and walker    Balance  Static Sitting: good, considerably improved from last OT session  Dynamic Sitting: good, conservative when moving outside base of support,left lean.  Static Standing: fair with RW  Dynamic Standing: fair with RW    Self Care and Home Management  Eating: setup  Grooming: setup/supervision  Bathing: able to reach face, arms, legs/feet and perineal area with washcloth with setup  UE Dressing: min A  LE Dressing: mod/max A  Toileting: max A    Therapeutic Exercises  Shoulder flexion/abduction 1x10 BUEs,strength >3+/5 both UEs ,tremors right UE >left UE    Participation: good  Endurance: fatigues easily    Patient left with call bell within reach, all needs met, SCDs ON, fall mat in place, bed alarm ON, chair alarm NA and all questions answered. RN notified of session outcome and patient response.     Goals:  Time For Goal Achievement: 5 visits  ADL Goals  Patient will groom self: Independent, 5 visits, at sinkside, without AE  Patient will dress upper body: Supervision, 5 visits, without AE  Mobility and Transfer Goals  Pt will perform functional transfers: Supervision, 5  visits, with rolling walker  Neuro Re-Ed Goals  Pt will perform dynamic sitting balance: Independent, 5 visits, to increase ability to complete ADLs  Musculoskeletal Goals  Pt will demonstrate increase of strength: to 4+/5, to increase ability to complete ADLs, 5 visits  Pt will perform fine motor coordination tasks: independent, 5 visits, to increase ability to complete ADLs, w/ home exercise program, feeding, grooming, fasteners  Executive Fucntion Goals  Pt will demonstrate memory/orientation: independent, 5 visits, to increase ability to complete  ADLs, to time, to situation, to place  Pt will demonstrate safety: independent, 5 visits, to increase ability to complete ADLs, instituting safety techniques  Pt will demonstrate attention to task: independent, 5 visits, to increase ability to complete ADLs                Ander Purpura, MA/OTR, CSRS, LSVT-BIG  [phone]    Time of Treatment  OT Received On: 03/03/15  Start Time: 1400  Stop Time: 1430  Time Calculation (min): 30 min    Treatment # 2 of 5 visits

## 2015-03-03 NOTE — PT Progress Note (Signed)
[facility]   Physical Therapy Treatment  Patient:  Gregory [name] MRN#:  [id number]  Unit: [facility] TOWER 4  Bed: V409/W119.14    Discharge Recommendations:   D/C Recommendations: SNF   DME Recommendations: tbd    If SNF recommended discharge disposition is not available, patient will need max assist for mobility, hospital bed, hoyer, w/c, bsc equipment, and home PT.        Assessment:   Pt alert and following directions but remains with considerable L hip/flank pain and global weakness. Pt able to come to EOB with mod a and maintain EOB balance with S. Attempted sit to stand several attempts with good effort but limited by pain.     Treatment Activities: tf and balance training, therapeutic exercise    Educated the patient to role of physical therapy, plan of care, goals of therapy and safety with mobility and ADLs.    Plan:   PT Frequency: 2-3x/wk    Continue plan of care.       Precautions and Contraindications:   Falls    Updated Medical Status/Imaging/Labs: tf to [facility] with progressing hematoma/anemia, received transfusion     Subjective:    "My leg hurts"  Patient's medical condition is appropriate for Physical Therapy intervention at this time.  Patient is agreeable to participation in the therapy session.    Pain:   Scale: 5/10  Location: L hip w/movement  Intervention: improved with repositioning       Objective:   Patient is in bed with telemetry and SCD's in place. Jaundice w/large L hip/flank hematoma. Edematous through LEs.    Cognition/neuro exam  Alert and following directions, oriented     Functional Mobility  Rolling: mod a  Supine to Sit: mod a to R  Scooting: min a  Sit to Stand: mod a to partial stand- limited by pain   Stand to Sit: min a  Transfers: nt    Ambulation  Level of Assistance required: nt  Ambulation Distance: nt  Pattern: nt  Device Used: nt  Weightbearing Status: nt  Stair Management: nt  Number of Stairs: nt  Door Management: nt  Wheelchair  Management: nt    Armed forces operational officer Sitting: good  Dynamic Sitting: fair+  Engineer, water Standing: u/a  Dynamic Standing: nt    Therapeutic Exercises  Seated at EOB x 4    Patient Participation: good  Patient Endurance: fair+    Patient left with call bell within reach, all needs met, SCDs on, fall mat down, bed alarm on and all questions answered. RN notified of session outcome and patient response.     Goals:  Goals  Goal Formulation: With patient  Time for Goal Acheivement: 5 visits  Goals: Select goal  Pt Will Go Supine To Sit: with supervision  Pt Will Perform Sit to Stand: with minimal assist  Pt Will Transfer Bed/Chair: with moderate assist  Pt Will Ambulate: 11-30 feet, with rolling walker, with moderate assist      Marylou Flesher, DPT    Time of Treatment:  PT Received On: 03/03/15  Start Time: 1140  Stop Time: 1210  Time Calculation (min): 30 min  Treatment # 2 out of 5 visits

## 2015-03-03 NOTE — Progress Notes (Signed)
[facility]  PROGRESS NOTE  [facility] call: [phone], [phone]  [facility] call: [phone]  After hours call [phone]    Date Time: 03/03/2015 6:03 PM  Patient Name: Gregory [name], Gregory [name]      Chief Complaint:   Alcoholic hepatitis/cirrhosis    Assessment and Plan:   Assessment: 39 yo M with EtOH abuse presented to [facility] ED with abdominal pain, recent fall and found to have anemia and jaundice. CT with large left hip hematoma, ascites.     1. Anemia in setting of worsening large left hip hematoma, likely main source.No report of overt GI bleeding at this time.  2. Alcoholic hepatitis and underlying known cirrhosis.DF is 55. MELD 27 (6/12). LFTs stable. Hepatitis BsAg, Hep C Ab neg, Ceruloplasmin 15, iron/TIBC normal; ferritin 1988. AFP 7.1. ANA, mitochondrial Ab negative  3. Alcohol abuse, active until admission    Plan:   1. Recommend diagnostic & therapeutic paracentesis when feasible and please check cell count, gram stain, culture, cytology, and albumin  2. Continue ABX - if he cannot get paracentesis, would favor switching ABX back to Rocephin or other broad-spectrum agent for presumed diagnosis of SBP  3. Hold on Prednisolone given recent fever, unclear source  4. PPI  5. Follow H&H and continue to monitor for overt GI bleeding. Transfuse PRBC as appropriate, but goal hct ~22 in setting of cirrhosis and evidence of varices on imaging  6. Follow LFTs/INR daily  7. EtOH abstinence  8. Lactulose TID, titrate to 2-3 soft stools daily  9.Xifaxan 550mg  BID  10. Encourage nutrition -- calorie count.  11. Recommend starting Aldactone 100mg  daily and Lasix 20mg  daily  12. Please change diet to 2g low sodium     Subjective:   No recent fevers, but receiving Tylenol fairly regularly.  On Cipro.  Paracentesis delayed d/t INR >2.  It is 2.5 today.     Medications:     Current Facility-Administered Medications   Medication Dose Route Frequency   . albumin human  25 g Intravenous Once   .  ciprofloxacin  400 mg Intravenous Q12H SCH   . folic acid  1 mg Oral Daily   . lactulose  30 g Oral 4 times per day   . multivitamin  1 tablet Oral Daily   . pantoprazole  40 mg Intravenous BID   . rifaximin  550 mg Oral BID   . thiamine  100 mg Oral Daily       Review of Systems:   Complete 12 Point ROS negative or normal except those mentioned in HPI above.    Physical Exam:     Filed Vitals:    03/03/15 1600   BP: 135/87   Pulse: 97   Temp: 97.1 F (36.2 C)   Resp: 18   SpO2: 100%   BP 135/87 mmHg  Pulse 97  Temp(Src) 97.1 F (36.2 C) (Oral)  Resp 18  Ht 1.93 m (6\' 4" )  Wt 81.647 kg (180 lb)  BMI 21.92 kg/m2  SpO2 100%    General appearance: Chronically-ill appearing, appears stated age and in NAD, tremulous  Eyes: Sclera anicteric, pink conjunctivae, no ptosis  ENMT: mucous membranes moist, nose and ears appear normal.  Oropharynx clear.  Chest: Non labored respirations, no audible wheezing, no clubbing or cyanosis  CV:  Tachycardic, + B/L ankle edema  Abdomen: somewhat firm, distended, tender throughout, no masses or organomegaly  Skin: +Jaundice, normal turgor, no rashes, no suspicious skin lesions noted  Neuro: CN  II-XII grossly intact.  No gross movement disorders noted. + asterixis  Mental status: Appropriate affect, alert and oriented x 3    Labs:     Recent Labs      03/03/15   1352  03/02/15   2308   WBC  11.33*  13.37*   HGB  7.9*  8.1*   HEMATOCRIT  22.5*  22.9*   PLATELETS  90*  85*   MCV  95.7  96.6       Recent Labs      03/03/15   0402  03/02/15   0312   SODIUM  135*  138   POTASSIUM  3.7  3.4*   CHLORIDE  105  107   CO2  22  22   BUN  24.0  24.0   CREATININE  1.0  1.0   GLUCOSE  81  100   CALCIUM  8.0*  8.2*       Recent Labs      03/03/15   0402  03/02/15   0312   AST (SGOT)  108*  116*   ALT  25  27   ALKALINE PHOSPHATASE  86  90   BILIRUBIN, TOTAL  22.7*  24.0*   BILIRUBIN, DIRECT  14.4*  14.9*   PROTEIN, TOTAL  6.6  7.0   ALBUMIN  2.0*  2.2*       Recent Labs      03/03/15   0402   03/02/15   0312   PTT  43*  42*   PT  26.4*  24.4*   PT INR  2.5*  2.2*        Radiology:   Radiological Procedure reviewed:  [place] RUQ 02/23/15:   1. The liver demonstrates a coarsened echotexture with a mildly nodular contour. There is no intrahepatic biliary ductal dilatation or mass.  2. A moderate amount of mobile sludge is identified within a distended gallbladder. There is no gallstone. The gallbladder wall is edematous and may be partially due to the surrounding ascites. If there is high clinical concern of acute cholecystitis a HIDA scan is recommended.  3. There is no hydronephrosis within the right kidney. There is a 1.0 cm benign appearing upper pole cyst.  4. Ascites. The remainder is as above.    CT A/P without contrast 02/28/15:  1. Stable large intramuscular hematoma surrounding left hip and upper thigh involving left gluteus musculature and lateral abductors.  2. No retroperitoneal hemorrhage. Unchanged large ascites.

## 2015-03-03 NOTE — Plan of Care (Signed)
Problem: Safety  Goal: Patient will be free from injury during hospitalization  Outcome: Progressing  Pt Aox3. Afebrile, moves all extremities, and VSS see flow sheets for trends. 1 watery BM today and diminished urine output. Gave PRN tramadol 50mg , order was 25mg , with no advese effects. MD notified of incident. Pt transfer to ST10. No other significant events during shift. Will continue to monitor.

## 2015-03-03 NOTE — Student Progress (Signed)
MEDICINE STUDENT PROGRESS NOTE    Date Time: 03/03/2015 6:35 AM  Patient Name: Gregory [name]  Attending Physician: Gregory Edin, MD    Subjective   CC: liver failure    Subjective: 4 BMs in last 24 hours. Turbid brown urine, sent for UA. Reports 9/10 left hip pain as well as back pain and reports pain is not well controlled with Tylenol.     Review of Systems:   Denies n/v/sob  Report left thigh pain    Physical Exam:     VITAL SIGNS PHYSICAL EXAM   Temp:  [97.5 F (36.4 C)-98.2 F (36.8 C)] 98.2 F (36.8 C)  Heart Rate:  [94-105] 95  Resp Rate:  [14-19] 17  BP: (108-128)/(55-79) 124/79 mmHg  Blood Glucose: 81        Intake/Output Summary (Last 24 hours) at 03/03/15 0635  Last data filed at 03/03/15 0600   Gross per 24 hour   Intake    200 ml   Output    875 ml   Net   -675 ml    General: NAD  Neuro: A+Ox4, follows commands  Lungs: CTAB, good air movement  Cardiovascular: Tachycardic, normal S1/S2 without murmurs, rubs, or gallops  Abdomen: Non-tender, distended, tense  Extremities: Resting tremor, left thigh hematoma decreased from yesterday, pulse detectable via doppler, wiggles toes, pitting edema in left LE  Skin: Jaundice       Lines (list foley, central lines, picc lines, and date on insertion):   PIV x2, placed 6/5  Condom catheter, placed 6/9  R PICC, placed 6/11    Meds (essential medications - list Abx days, group by class):     Scheduled Meds:  Current Facility-Administered Medications   Medication Dose Route Frequency   . albumin human  25 g Intravenous Once   . ciprofloxacin  400 mg Intravenous Q12H SCH   . folic acid  1 mg Oral Daily   . lactulose  30 g Oral 4 times per day   . multivitamin  1 tablet Oral Daily   . pantoprazole  40 mg Intravenous BID   . rifaximin  550 mg Oral BID   . thiamine  100 mg Oral Daily     Continuous Infusions:     PRN Meds:.sodium chloride, sodium chloride, sodium chloride, sodium chloride, acetaminophen, LORazepam, naloxone, ondansetron **OR** ondansetron,  traMADol      Labs:     Labs (last 72 hours): (ensure neat format, include any other labs not listed below)       Recent Labs  Lab 03/02/15  2308 03/02/15  1138   WBC 13.37* 12.30*   HGB 8.1* 7.9*   HEMATOCRIT 22.9* 22.9*   PLATELETS 85* 75*         Recent Labs  Lab 03/03/15  0402 03/02/15  0312   PT 26.4* 24.4*   PT INR 2.5* 2.2*   PTT 43* 42*      Recent Labs  Lab 03/03/15  0402 03/02/15  0312   SODIUM 135* 138   POTASSIUM 3.7 3.4*   CHLORIDE 105 107   CO2 22 22   BUN 24.0 24.0   CREATININE 1.0 1.0   CALCIUM 8.0* 8.2*   ALBUMIN 2.0* 2.2*   PROTEIN, TOTAL 6.6 7.0   BILIRUBIN, TOTAL 22.7* 24.0*   ALKALINE PHOSPHATASE 86 90   ALT 25 27   AST (SGOT) 108* 116*   GLUCOSE 81 100  Microbiology (update daily)  MRSA swab (6/6): negative  Blood culture (6/10): NGTD  Urine culture (6/10): No growth of >1,000 CFU/mL    Imaging (summarize)  CT Abdomen (6/11): 1. Stable large intramuscular hematoma surrounding left hip and upper thigh involving left gluteus musculature and lateral abductors. 2. No retroperitoneal hemorrhage. Unchanged large ascites.  US Abdomen (6/9): Moderate amount of ascites.  CXR (6/7): Basal opacities and left pleural effusion increased.  RUQ [place] (6/6): 1. The liver demonstrates a coarsened echotexture with a mildly nodular  contour. There is no intrahepatic biliary ductal dilatation or mass. 2. A moderate amount of mobile sludge is identified within a distended gallbladder. There is no gallstone. The gallbladder wall is edematous and may be partially due to the surrounding ascites. If there is high clinical concern of acute cholecystitis a HIDA scan is recommended. 3. There is no hydronephrosis within the right kidney. There is a 1.0 cm benign appearing upper pole cyst. 4. Ascites. The remainder is as above.  CT (6/5): 1. No renal or ureteral calculi or hydronephrosis. 2. Normal lumbar spine. 3. Left pelvic and hip intramuscular hematoma. 4. Cirrhotic appearing liver with abdominal varices  and moderate to large ascites. 5. Left lower lobe atelectasis and moderate pleural effusion.   CXR (6/5): 1. Left basilar airspace disease and right basilar atelectasis. 2. Left pleural fluid/thickening.    Assessment (brief and succinct one liner with diagnoses, not symptoms):     Mr. Gregory [name] is a 39 year old male with PMH of alcohol abuse and alcoholic cirrhosis who presents with AMS, likely due to hepatic encephalopathy, and large left thigh hematoma.     Plan (by active issues, prioritized.  Can just list the inactive issues):   # Fever, reaction to transfusions vs. Infection  - Afebrile since 6/11 evening  - WBC trending up (12.30 --> 13.37)  - Possibly due to spontaneous bacterial peritonitis. Diagnostic paracentesis deferred yesterday due to INR 2.2. Today INR is 2.5. Order placed for 2 units FFP and vitamin K.   - Per GI: recommend diagnostic paracentesis and check cell count, gram stain, culture, cytology, and albumin  - Cipro: day 3/7    # Left thigh hematoma  - S/p mechanical fall last weekend  - CT at outside hospital showed left hip intramuscular hematoma  - Left pedal pulse not palpable, but detectable by doppler  - Large left thigh hematoma possibly cause of anemia  - Continue tramadol PRN  - Continue to monitor for signs of compartment syndrome    # Anemia, likely acute blood loss  - Recent fall with large left sided hip hematoma - could be the source of anemia but cannot rule out GI etiology  - MCV 92.8 on arrival, not suggestive of megaloblastic anemia  - No evidence of hematemesis and no report of hematemesis. No documented history of varices available  - Hgb 6.5 on arrival   - CT abdomen 6/11 not suggestive of retroperitoneal bleed  - Hgb 8.1, hct 22.9  - Every q12 hr CBCs, transfuse for hgb <22  - Continue Protonix IV twice daily    # Transaminitis likely secondary to alcoholic cirrhosis, AST and ALT trending down (108/25)  - CT at OSH showed cirrhotic appearing liver with abdominal varices and  moderate to large ascites  - AST and AST elevated in alcoholic pattern, trending down  - Ceruloplasmin: 15. Falsely low ceruloplasmin levels may be observed in any protein deficiency state.  - HAV IgM, HBV and HCV were  negative  - ANA, AMA: negative  - AFP: normal (7.1)\  - Meld score 29, 19.6% estimated 64-month mortality  - Per GI: Obtain infectious workup (UA, urine culture, blood culture x2, CXR). If these are negative, consider prednisolone  - Maddrey's Discriminant Function: 80.7 --> suggests would benefit from steroids    # Ascites  - Moderate ascites confirmed on [place] and abdominal CT  - Abdomen is distended  - INR trending up at 2.5  - Per procedure team: INR should be <2, paracentesis deferred yesterday due to elevated INR  - Per GI: diagnostic paracentesis and check cell count, gram stain, cytology, and albumin  - Vitamin K and FFP    # Pleural effusion  - CXR 6/7: Basal opacities and left pleural effusion increased.  - Received 20 mg furosemide yesterday.   - Re-check CXR    # Active alcohol abuse  - Received 1 banana bag  - Continue daily multivitamin, folic acid, and thiamine  - Continue CIWA protocol and provide ativan as needed, now 4 days s/p last EtOH  - Per CATS: Recommend patient follow up with [facility], [facility] and can be reached at [phone].     # Sinus Tachycardic, mid 90s to low 100s  - Likely multifactorial: pain, blood loss into thigh hematoma, and EtOH withdrawal  - Continue CIWA and monitor fluid status    # Altered mental status  - Differential diagnosis: hepatic encephalopathy vs. Wernike- Korsakoff vs. EtOH withdrawal vs. Pain meds  - Continue neuro checks q4 hrs  - Continue sitter to continue to reorient  - Continue haldol PRN  - Avoid sedating meds as much as possible (reduce tramadol dose)  - Titrate lactulose to 2-3 BMs daily  - Continue rifaximin    # Thrombocytopenia likely secondary to cirrhosis vs dillutional  - Platelets increasing, 67 on presentation, now 85  -  Continue CBCs q12 hours    # Hyperbilirubinemia likely secondary to alcoholic cirrhosis vs. Hematoma resorption  - TBili elevated to 22.7 with indirect elevated to 8.3, direct elevated to 14.4    # Tremor  - Differential diagnosis: asterixis due to hepatic encephalopathy vs. Acute EtOH withdrawal  - No asterixis on exam, likely due to EtOH withdrawal    # AKI, likely hepatorenal syndrome, resolved  - IVF were D/C 2/2 receiving so many blood products    # Nausea and vomiting, could be due to colitis found on CT scan vs. Cholecystitis, resolved   - RUQ [place] (6/5): moderate mobile sludge in a distended gallbladder.   - Protonix as above  - Zofran as needed  - Follow up GI recs    # Metabolic acidosis with elevated lactate, resolved  - Expect some level of lactic acidosis given liver failure  - Lactic acid is trending down with IV fluids 4.7 --> 3.0 --> 2.3 --> 2.0    DVT prophylaxis: SCDs  FEN/GI: Regular, replete electrolytes PRN  Code status: full code    Disposition (what needs to be accomplished before the patient returns home):   Anemia stabilized  Discharge to SNF vs. Home with PT    Signed by: Dewitt Rota, BS

## 2015-03-03 NOTE — Plan of Care (Addendum)
Problem: Health Promotion  Goal: Knowledge - disease process  Extent of understanding conveyed about a specific disease process.   Outcome: Progressing  Received pt at 1530 from [facility] via stretcher, alert  X3, vS stable. He copmplaint of back and hip pain, prn tramadol given with relief. Noted jaundice skin, abdomen distended, LLE weak pulses and motor strength. ON CIWA protocol. On seizure , fall and aspiration precaution. Safety measures in place, floor mats bed alarms, bed in lowest position position, call lights and personal belongings within reach.Midline on right upper arm flushed patent dressing intact dry.  Will continue to monitor.

## 2015-03-03 NOTE — Progress Notes (Signed)
Nutrition Follow-Up:    A/D/I:    Recommend:   Continue regular diet as tolerated (if retaining fluid, can switch to low sodium diet)  If po intake <75% at meals, add Ensure Enlive BID  Continue folic acid, MVI, thiamine     Clinical Update:  Hx EtOH cirrhosis, depressed mental status, anemia, L thigh hematoma, ascites, biliary sludge  Labs: NA 135, Alb 2  Meds: cipro, folic acid, lactulose, MVI, protonix, KCl, thiamine    Diet / Nutrition Support Order: Regular diet     M/E:  Monitor po, tolerance to diet, GI, med tx plan     Georgina Pillion, RD; Philis Kendall [phone]

## 2015-03-03 NOTE — Progress Notes (Signed)
MEDICINE PROGRESS NOTE    Date Time: 03/03/2015 6:09 AM  Patient Name: Gregory [name]  Attending Physician: Lucilla Edin, MD  Team Contact:   Medicine Resident Team A  Spectra: [phone]  Night: [phone]      Assessment:     Active Hospital Problems    Diagnosis   . Hematoma of left lower extremity   . Lactic acidosis   . Anemia, unspecified type   . Liver failure   . Liver failure without hepatic coma, unspecified chronicity   . Alcohol withdrawal, with unspecified complication   . Tachycardia   . Altered mental status, unspecified altered mental status type   . Increased ammonia level       39 year-old-male with history of alcoholic cirrhosis, now likely alcoholic hepatitis, SBP, and L thigh hematoma.     Plan:   #LFT elevation and hepatic cirrhosis  Likely alcoholic hepatitis. Discriminant score 75. HepBsAg, HepC Ab neg. ANA, ASMA, AFP neg. Repeat abdominal US on 6/9 still with moderate ascites.   - holding prenisolone in setting of active infection  - Diagnostic and therapeutic paracentesis when INR and PLT appropriately reduced   - Lactulose, titrate to 2-3 soft stools daily  - Rifaximin 550mg  BID    #Fevers   Suspect SBP, though source unclear. May be in response to frequent blood product transfusions, but hematoma could also be source of infection.  - f/u blood cultures (ngtd)  - monitor for further fever  - empirically treating SBP as paracentesis currently unfeasible given elevated INR in the setting of liver dysfunction  -ciprofloxacin 400mg  BID, consider switching to ceftriaxone if unable to get paracentesis    #Anemia  Stable. Likely secondary to enlarging L thigh hematoma due to recent fall noted on OSH CT.   - Ortho following - f/u recs  - S/p multiple units PRBC  - Monitor CBC daily  - Protonix IV 40mg  BID    #L thigh hematoma  Recent fall with left sided hip trauma and L thigh hematoma noted on OSH CT  - Neurovascular checks of LLE  - Low dose tylenol and tramadol for pain     #Active  alcohol abuse  - CIWA protocol  - PO multivitamin, folic acid, and thiamine  - CATS consult    #Altered mental status  Likely from dilaudid and ativan given outside hospital but can rule out hepatic encephalopathy. Improving slowly.   - Lactulose and rifaximin as above. Continue CIWA and avoid sedating meds as much as possible (reduce tramadol dose).     #Sinus tachycardia   Likely multi-factorial, from pain, anxiety, EtOH withdrawal.   - Continue CIWA, monitor fluid status     #Thrombocytopenia likely secondary to cirrhosis  - Monitor with daily CBC. Transfuse as above.     #Distended gallbladder on outside CT scan  6/6 RUQ US showed mobile sludge seen within a distended gallbladder and edematous gallbladder wall is edematous (which may be partially due to the surrounding ascites), no gallstone.   - Per GI, exam not concerning, hold off on HIDA scan for now    FEN: regular diet  Ppx: SCD for DVT  Code: FULL     #Global  DVT Prophalyxis: holding in setting of coagulopathy  Lines: peripheral IV  Fluids: None  Electrolytes: Monitor and replete as needed  Nutrition: Regular diet   Code: Full   Subjective   Chief Complaint  Altered mental status     Interval History/Subjective  4 BMs in last  24 hours. Turbid brown urine, sent for UA. Reports 9/10 left hip pain as well as back pain and reports pain is not well controlled with Tylenol.      Objective   Physical Exam  Temp:  [97.5 F (36.4 C)-98.2 F (36.8 C)] 98.2 F (36.8 C)  Heart Rate:  [94-105] 95  Resp Rate:  [14-19] 17  BP: (108-128)/(55-79) 124/79 mmHg  General: awake, alert, oriented x 2; no acute distress.  HEENT: perrla, eomi, icteric sclera oropharynx clear without lesions, mucous membranes moist  Neck: supple, no LAD, no thyromegaly, no JVD, no carotid bruits  Cardiovascular: fast rate regular rhythm, no murmurs, rubs, or gallops  Lungs: clear to auscultation bilaterally, without wheezing, rhonchi, or rales  Abdomen: tense, distended, tympanic,  mild generalized tenderness, shifting dullness   Extremities: large L lateral thigh hematoma with ecchymotic skin changes, LLE diffusely tender to palpation, BLE warm to touch  Neuro: cranial nerves grossly intact, strength 5/5 in upper and lower extremities, sensation intact  Skin: jaundice, no spider angiomata         Intake/Output Summary (Last 24 hours) at 03/03/15 9604  Last data filed at 03/03/15 0600   Gross per 24 hour   Intake    200 ml   Output    875 ml   Net   -675 ml     Telemetry: NAE  Glucose: 81       Medications  Medications were reviewed.  Significant for:  Current Facility-Administered Medications   Medication Dose Route Frequency Last Rate Last Dose   . 0.9%  NaCl infusion   Intravenous PRN       . acetaminophen (TYLENOL) tablet 325 mg  325 mg Oral Q4H PRN   325 mg at 03/03/15 0535   . albumin human 25 % bag 25 g  25 g Intravenous Once   Stopped at 03/01/15 1650   . ciprofloxacin (CIPRO) 400mg  in D5W IVPB (premix)  400 mg Intravenous Q12H SCH 200 mL/hr at 03/03/15 1033 400 mg at 03/03/15 1033   . folic acid (FOLVITE) tablet 1 mg  1 mg Oral Daily   1 mg at 03/03/15 1033   . lactulose (CHRONULAC) 10 GM/15ML solution 30 g  30 g Oral 4 times per day   30 g at 03/03/15 1432   . LORazepam (ATIVAN) injection 2-4 mg  2-4 mg Intravenous PRN   2 mg at 02/27/15 0206   . multivitamin (MULTIVITAMIN) tablet 1 tablet  1 tablet Oral Daily   1 tablet at 03/03/15 1033   . naloxone [facility]) injection 0.2 mg  0.2 mg Intravenous PRN       . ondansetron (ZOFRAN-ODT) disintegrating tablet 4 mg  4 mg Oral Q8H PRN        Or   . ondansetron (ZOFRAN) injection 4 mg  4 mg Intravenous Q8H PRN       . pantoprazole (PROTONIX) injection 40 mg  40 mg Intravenous BID   40 mg at 03/03/15 1033   . rifaximin (XIFAXAN) tablet 550 mg  550 mg Oral BID   550 mg at 03/03/15 1032   . thiamine (B-1) tablet 100 mg  100 mg Oral Daily   100 mg at 03/03/15 1032   . traMADol (ULTRAM) tablet 25 mg  25 mg Oral Q6H PRN   50 mg at 03/03/15  0934         Labs    Recent Labs  Lab 03/03/15  0402 03/02/15  5409  SODIUM 135* 138   POTASSIUM 3.7 3.4*   CHLORIDE 105 107   CO2 22 22   BUN 24.0 24.0   CREATININE 1.0 1.0   CALCIUM 8.0* 8.2*   ALBUMIN 2.0* 2.2*   PROTEIN, TOTAL 6.6 7.0   BILIRUBIN, TOTAL 22.7* 24.0*   ALKALINE PHOSPHATASE 86 90   ALT 25 27   AST (SGOT) 108* 116*   GLUCOSE 81 100         Recent Labs  Lab 03/02/15  2308 03/02/15  1138   WBC 13.37* 12.30*   HGB 8.1* 7.9*   HEMATOCRIT 22.9* 22.9*   PLATELETS 85* 75*         Recent Labs  Lab 03/03/15  0402 03/02/15  0312   PT 26.4* 24.4*   PT INR 2.5* 2.2*   PTT 43* 42*       Microbiology  Microbiology Results     Procedure Component Value Units Date/Time    CULTURE BLOOD AEROBIC AND ANAEROBIC [[id number]] Collected:  02/27/15 1709    Specimen Information:  Blood Updated:  03/03/15 0025    Narrative:      ORDER#: [id number]                                    ORDERED BY: Dewaine Conger  SOURCE: Blood blood                                  COLLECTED:  02/27/15 17:09  ANTIBIOTICS AT COLL.:                                RECEIVED :  02/27/15 21:53  Culture Blood Aerobic and Anaerobic        PRELIM      03/02/15 22:21  02/28/15   No Growth after 1 day/s of incubation.  03/01/15   No Growth after 2 day/s of incubation.  03/02/15   No Growth after 3 day/s of incubation.      CULTURE BLOOD AEROBIC AND ANAEROBIC [[id number]] Collected:  02/27/15 0906    Specimen Information:  Blood Updated:  03/02/15 1221    Narrative:      ORDER#: [id number]                                    ORDERED BY: Dewaine Conger  SOURCE: Blood blood                                  COLLECTED:  02/27/15 09:06  ANTIBIOTICS AT COLL.:                                RECEIVED :  02/27/15 11:42  Culture Blood Aerobic and Anaerobic        PRELIM      03/02/15 12:21  02/28/15   No Growth after 1 day/s of incubation.  03/01/15   No Growth after 2 day/s of incubation.  03/02/15   No Growth after 3 day/s of incubation.      MRSA culture [[id number]]  Collected:  02/23/15 1413    Specimen  Information:  Body Fluid from Nasal/Throat ASC Admission Updated:  02/24/15 1829    Narrative:      ORDER#: [id number]                                    ORDERED BY: Shawnie Pons  SOURCE: Nares and Throat                             COLLECTED:  02/23/15 14:13  ANTIBIOTICS AT COLL.:                                RECEIVED :  02/23/15 18:14  Culture MRSA Surveillance                  FINAL       02/24/15 18:29  02/24/15   Negative for Methicillin Resistant Staph aureus from Nares and             Negative for Methicillin Resistant Staph aureus from Throat      Urine culture [[id number]] Collected:  02/27/15 0853    Specimen Information:  Urine from Urine, Clean Catch Updated:  02/28/15 1343    Narrative:      ORDER#: [id number]                                    ORDERED BY: Dewaine Conger  SOURCE: Urine, Clean Catch                           COLLECTED:  02/27/15 08:53  ANTIBIOTICS AT COLL.:                                RECEIVED :  02/27/15 14:23  Culture Urine                              FINAL       02/28/15 13:43  02/28/15   No growth of >1,000 CFU/ML, No further work              Imaging  Ct Abdomen Pelvis Wo Iv/ Wo Po Cont    02/28/2015    1. Stable large intramuscular hematoma surrounding left hip and upper thigh involving left gluteus musculature and lateral abductors. 2. No retroperitoneal hemorrhage. Unchanged large ascites.  Charlott Rakes, MD  02/28/2015 9:41 PM     Xr Chest Ap Portable    02/24/2015    Basal opacities and left pleural effusion increased.  Clide Cliff, MD  02/24/2015 10:00 AM     US Abdomen Limited Single Organ    02/26/2015   Impression: Moderate amount of ascites.  Pennelope Bracken, MD  02/26/2015 3:11 PM         Case discussed with: Dr. Janece Canterbury, attending physician    Signed by: Rea College, MD PGY-1

## 2015-03-03 NOTE — Progress Notes (Signed)
Per PT/OT note, patient is recommended for SNF - writer place referral via [employer].  Patient transferred to [facility] 1016 - this Clinical research associate provided updates on d/c plans to covering discharge planner Yehuda Budd [phone]).     Herma Carson, RN, [facility] Discharge Planner  [facility]  [phone]

## 2015-03-03 NOTE — Plan of Care (Signed)
A/O x4. Calm and alert. VSS. Afebrile. See flowsheet for vital trends. RA with no desats. Neurovascular checks Q4H, pulses palpable and extremities are warm. Pt having dark orange-amber urine. No BM overnight. BM x4 within 24 hours. 0000 Lactulose held, 0600 dose given. Per GI note, titrate lactulose to 2-3 BM per day. Tylenol x1 for mild headache with moderate relief. Tramadol x1 and Tylenol x1 for back pain with little relief. Will continue to monitor.

## 2015-03-03 NOTE — Progress Notes (Signed)
Attending Attestation:     I have seen and personally examined the patient.  I agree with the findings and exam as documented by Dr. Lynnea Ferrier    Plan:    Suspected spontaneous bacterial peritonitis.  We will continue with  intravenous antibiotic.  Currently, the patient is on Cipro.  The patient  has been afebrile for the past 36-48 hours.  Paracentesis cannot be done;  [facility] team declined procedure due to elevated INR above 2.  We will consider  FFP and vitamin KI, in order to correct INR to 2 or below in order for [facility]  team to consider paracentesis.  Left thigh hematoma, continue to monitor CBC.  Symptomatic management.  The  patient is on pain control.  Alcohol withdrawal, with history of alcohol abuse, suspected alcoholic  hepatitis.  We will continue to monitor.  Thus far, there is no  identifiable source for infection.  We will discuss the case with GI team  for further recommendation, to see if the patient is a candidate for a  course of steroids.  In the meantime, We will continue with CIWA protocol  as needed.     Case discussed with the patient in detail, as well as gastroenterology  team.     Time spent is 35 minutes, greater than 50% in counseling and coordination  of care.        Disposition:     Today's date: 03/03/2015  Admit Date: 02/28/2015  6:03 PM  Anticipated medical stability for discharge:Red - not tomorrow - estimated month/date: 03-11-2015  Service status: Inpatient: risk of morbidity and mortality, risk of progressive disease and risk of readmission  Reason for ongoing hospitalization: DT  Anticipated discharge needs: tbd     Lucilla Edin, MD

## 2015-03-04 ENCOUNTER — Ambulatory Visit: Payer: Self-pay

## 2015-03-04 ENCOUNTER — Ambulatory Visit: Payer: Charity

## 2015-03-04 DIAGNOSIS — K7031 Alcoholic cirrhosis of liver with ascites: Secondary | ICD-10-CM

## 2015-03-04 DIAGNOSIS — R000 Tachycardia, unspecified: Secondary | ICD-10-CM

## 2015-03-04 DIAGNOSIS — R4182 Altered mental status, unspecified: Secondary | ICD-10-CM

## 2015-03-04 LAB — HEPATIC FUNCTION PANEL
ALT: 30 U/L (ref 0–55)
AST (SGOT): 132 U/L — ABNORMAL HIGH (ref 5–34)
Albumin/Globulin Ratio: 0.4 — ABNORMAL LOW (ref 0.9–2.2)
Albumin: 2.1 g/dL — ABNORMAL LOW (ref 3.5–5.0)
Alkaline Phosphatase: 89 U/L (ref 38–106)
Bilirubin Direct: 15.3 mg/dL — ABNORMAL HIGH (ref 0.0–0.5)
Bilirubin Indirect: 10.3 mg/dL — ABNORMAL HIGH (ref 0.0–1.1)
Bilirubin, Total: 25.6 mg/dL — ABNORMAL HIGH (ref 0.2–1.2)
Globulin: 5.3 g/dL — ABNORMAL HIGH (ref 2.0–3.6)
Protein, Total: 7.4 g/dL (ref 6.0–8.3)

## 2015-03-04 LAB — TYPE AND SCREEN
AB Screen Gel: NEGATIVE
ABO Rh: O POS

## 2015-03-04 LAB — CBC
Hematocrit: 20.5 % — ABNORMAL LOW (ref 42.0–52.0)
Hematocrit: 23.6 % — ABNORMAL LOW (ref 42.0–52.0)
Hematocrit: 25 % — ABNORMAL LOW (ref 42.0–52.0)
Hgb: 7.1 g/dL — ABNORMAL LOW (ref 13.0–17.0)
Hgb: 8.3 g/dL — ABNORMAL LOW (ref 13.0–17.0)
Hgb: 8.8 g/dL — ABNORMAL LOW (ref 13.0–17.0)
MCH: 33.8 pg — ABNORMAL HIGH (ref 28.0–32.0)
MCH: 34 pg — ABNORMAL HIGH (ref 28.0–32.0)
MCH: 34.5 pg — ABNORMAL HIGH (ref 28.0–32.0)
MCHC: 34.6 g/dL (ref 32.0–36.0)
MCHC: 35.2 g/dL (ref 32.0–36.0)
MCHC: 35.2 g/dL (ref 32.0–36.0)
MCV: 97.6 fL (ref 80.0–100.0)
MCV: 96.7 fL (ref 80.0–100.0)
MCV: 98 fL (ref 80.0–100.0)
MPV: 12.4 fL — ABNORMAL HIGH (ref 9.4–12.3)
MPV: 10.9 fL (ref 9.4–12.3)
MPV: 10.9 fL (ref 9.4–12.3)
Nucleated RBC: 0 /100 WBC (ref 0–1)
Nucleated RBC: 0 /100 WBC (ref 0–1)
Nucleated RBC: 0 /100 WBC (ref 0–1)
Platelets: 130 10*3/uL — ABNORMAL LOW (ref 140–400)
Platelets: 105 10*3/uL — ABNORMAL LOW (ref 140–400)
Platelets: 102 10*3/uL — ABNORMAL LOW (ref 140–400)
RBC: 2.1 10*6/uL — ABNORMAL LOW (ref 4.70–6.00)
RBC: 2.44 10*6/uL — ABNORMAL LOW (ref 4.70–6.00)
RBC: 2.55 10*6/uL — ABNORMAL LOW (ref 4.70–6.00)
RDW: 25 % — ABNORMAL HIGH (ref 12–15)
RDW: 24 % — ABNORMAL HIGH (ref 12–15)
RDW: 25 % — ABNORMAL HIGH (ref 12–15)
WBC: 14.5 10*3/uL — ABNORMAL HIGH (ref 3.50–10.80)
WBC: 13.29 10*3/uL — ABNORMAL HIGH (ref 3.50–10.80)
WBC: 12.53 10*3/uL — ABNORMAL HIGH (ref 3.50–10.80)

## 2015-03-04 LAB — PREPARE FRESH FROZEN PLASMA
Expiration Date: 201606191630
Expiration Date: 201606191906
Status: TRANSFUSED
Status: TRANSFUSED
UTYPE: A POS
UTYPE: A POS

## 2015-03-04 LAB — BASIC METABOLIC PANEL
BUN: 24 mg/dL (ref 9.0–28.0)
CO2: 19 mEq/L — ABNORMAL LOW (ref 22–29)
Calcium: 8.4 mg/dL — ABNORMAL LOW (ref 8.5–10.5)
Chloride: 103 mEq/L (ref 100–111)
Creatinine: 0.8 mg/dL (ref 0.7–1.3)
Glucose: 85 mg/dL (ref 70–100)
Potassium: 4.1 mEq/L (ref 3.5–5.1)
Sodium: 132 mEq/L — ABNORMAL LOW (ref 136–145)

## 2015-03-04 LAB — PT AND APTT
PT INR: 2.7 — ABNORMAL HIGH (ref 0.9–1.1)
PT: 28.4 s — ABNORMAL HIGH (ref 12.6–15.0)
PTT: 44 s — ABNORMAL HIGH (ref 23–37)

## 2015-03-04 LAB — BODY FLUID CELL COUNT
Body Fluid Lymphocytes: 10 %
Body Fluid Monocyte/Macrophage Cells: 77 %
Body Fluid Polymorphonuclear Cell: 5 %
Body Fluid RBC: 69 /mm3
Body Fluid WBC: 111 /mm3

## 2015-03-04 LAB — LACTATE DEHYDROGENASE, BODY FLUID: Body Fluid LDH: 46 U/L

## 2015-03-04 LAB — GFR: EGFR: >60

## 2015-03-04 LAB — AMYLASE, BODY FLUID: Body Fluid Amylase: 14 U/L

## 2015-03-04 LAB — PROTEIN, BODY FLUID: Body Fluid Protein: 1.1 g/dL

## 2015-03-04 LAB — ALBUMIN,BODY FLUID: Body Fluid Albumin: 0.5 g/dL

## 2015-03-04 LAB — PT/INR
PT INR: 2.1 — ABNORMAL HIGH (ref 0.9–1.1)
PT: 23.2 s — ABNORMAL HIGH (ref 12.6–15.0)

## 2015-03-04 LAB — APTT: PTT: 44 s — ABNORMAL HIGH (ref 23–37)

## 2015-03-04 LAB — GLUCOSE, BODY FLUID: Body Fluid Glucose: 98 mg/dL

## 2015-03-04 MED ORDER — SODIUM CHLORIDE 0.9 % IV SOLN
INTRAVENOUS | Status: DC | PRN
Start: 2015-03-04 — End: 2015-03-10

## 2015-03-04 MED ORDER — SPIRONOLACTONE 25 MG PO TABS
100.0000 mg | ORAL_TABLET | Freq: Every day | ORAL | Status: DC
Start: 2015-03-04 — End: 2015-03-09
  Administered 2015-03-04 – 2015-03-08 (×5): 100 mg via ORAL
  Filled 2015-03-04 (×5): qty 4

## 2015-03-04 MED ORDER — SODIUM CHLORIDE 0.9 % IV MBP
2.0000 g | INTRAVENOUS | Status: AC
Start: 2015-03-04 — End: 2015-03-07
  Administered 2015-03-04 – 2015-03-07 (×4): 2 g via INTRAVENOUS
  Filled 2015-03-04 (×4): qty 2000

## 2015-03-04 MED ORDER — FUROSEMIDE 20 MG PO TABS
20.0000 mg | ORAL_TABLET | Freq: Every day | ORAL | Status: DC
Start: 2015-03-04 — End: 2015-03-09
  Administered 2015-03-04 – 2015-03-08 (×5): 20 mg via ORAL
  Filled 2015-03-04 (×5): qty 1

## 2015-03-04 NOTE — Plan of Care (Signed)
Problem: Safety  Goal: Patient will be free from injury during hospitalization  Outcome: Progressing    Problem: Pain  Goal: Patient's pain/discomfort is manageable  Outcome: Progressing  Pt's pain is controlled with Tramadol 50 mg Q 8 hrs.    Comments:   Remains A&O x 4. No respiratory issues on R/A.  CIWA score under 8 at this time. Vomited x 1 at the start of shift.  Pt jaundiced. Seizure precautions maintained. Neuro check WNL.  No acute distress observed. H&H 7.1/20.5, from 7.9/22.5.  Na 132, from 135.  Resident notified, NNO.

## 2015-03-04 NOTE — Procedures (Signed)
Gregory [name] is a 39 y.o. male patient.  Active Problems:    Liver failure    Liver failure without hepatic coma, unspecified chronicity    Alcohol withdrawal, with unspecified complication    Tachycardia    Altered mental status, unspecified altered mental status type    Increased ammonia level    Lactic acidosis    Anemia, unspecified type    Hematoma of left lower extremity    Past Medical History   Diagnosis Date   . Thyroid disease    . Cirrhosis      Blood pressure 132/84, pulse 94, temperature 98.2 F (36.8 C), temperature source Oral, resp. rate 20, height 1.93 m (6\' 4" ), weight 81.647 kg (180 lb), SpO2 94 %.    Paracentesis  Date/Time: 03/04/2015 3:00 PM  Performed by: Jacklyn Shell  Authorized by: Anselm Pancoast  Consent: Written consent obtained.  Risks and benefits: risks, benefits and alternatives were discussed  Consent given by: patient  Patient understanding: patient states understanding of the procedure being performed  Patient consent: the patient's understanding of the procedure matches consent given  Procedure consent: procedure consent matches procedure scheduled  Relevant documents: relevant documents present and verified  Test results: test results available and properly labeled  Site marked: the operative site was marked  Imaging studies: imaging studies available  Required items: required blood products, implants, devices, and special equipment available  Patient identity confirmed: arm band, verbally with patient, hospital-assigned identification number and provided demographic data  Time out: Immediately prior to procedure a "time out" was called to verify the correct patient, procedure, equipment, support staff and site/side marked as required.  Initial or subsequent exam: initial  Procedure purpose: diagnostic and therapeutic  Indications: abdominal discomfort secondary to ascites  Anesthesia: local infiltration  Local anesthetic: lidocaine 1% without epinephrine  Anesthetic  total: 8 ml  Patient sedated: no  Preparation: Patient was prepped and draped in the usual sterile fashion.  Needle gauge: 5 Fr 7 cm yueh.  Ultrasound guidance: yes  Puncture site: left lower quadrant  Fluid removed: 3600(ml)  Fluid appearance: bilious  Dressing: tegaderm gauze.  Patient tolerance: Patient tolerated the procedure well with no immediate complications        Jacklyn Shell ACNP-BC  03/04/2015

## 2015-03-04 NOTE — Progress Notes (Signed)
MEDICINE PROGRESS NOTE    Date Time: 03/04/2015 7:19 AM  Patient Name: Gregory [name]  Attending Physician: Lucilla Edin, MD  Team Contact:   Medicine Resident Team A  Spectra: [phone]  Night: [phone]      Assessment:     Active Hospital Problems    Diagnosis   . Hematoma of left lower extremity   . Lactic acidosis   . Anemia, unspecified type   . Liver failure   . Liver failure without hepatic coma, unspecified chronicity   . Alcohol withdrawal, with unspecified complication   . Tachycardia   . Altered mental status, unspecified altered mental status type   . Increased ammonia level       39 year-old-male with history of alcoholic cirrhosis, now likely alcoholic hepatitis, SBP, and L thigh hematoma.     Plan:   #LFT elevation and hepatic cirrhosis  Likely alcoholic hepatitis. Discriminant score 75. HepBsAg, HepC Ab neg. ANA, ASMA, AFP neg. Repeat abdominal US on 6/9 still with moderate ascites.   - holding prenisolone in setting of active infection  - Diagnostic and therapeutic paracentesis today, reverse coagulopathy with 2 units of FFP 6/15   - Lactulose, titrate to 2-3 soft stools daily  - Rifaximin 550mg  BID  --aldactone  and lasix daily     #Fevers , resolved likely due to SBP   - f/u blood cultures (ngtd)  - monitor for further fever  - empirically treating SBP with ceftriaxone 2 gm daily     #Anemia  Stable. Likely secondary to  Resolving L thigh hematoma due to recent fall noted on OSH CT.   - Ortho following - f/u recs  - S/p multiple units PRBC  - Monitor CBC daily      #L thigh hematoma  Recent fall with left sided hip trauma and L thigh hematoma noted on OSH CT  - Neurovascular checks of LLE and monitor for compartment syndrome   - Low dose tylenol and tramadol for pain     #Active alcohol abuse  - CIWA protocol  - PO multivitamin, folic acid, and thiamine  - CATS consult when acute illness resolved     #Altered mental status  Likely from dilaudid and ativan given outside hospital but can rule  out hepatic encephalopathy. Improving slowly.   - Lactulose and rifaximin as above. Continue CIWA and avoid sedating meds as much as possible (reduce tramadol dose).     #Sinus tachycardia   Likely multi-factorial, from pain, anxiety, EtOH withdrawal.   - Continue CIWA, monitor fluid status     #Thrombocytopenia likely secondary to cirrhosis  - Monitor with daily CBC. Transfuse as above.     #Distended gallbladder on outside CT scan  6/6 RUQ US showed mobile sludge seen within a distended gallbladder and edematous gallbladder wall is edematous (which may be partially due to the surrounding ascites), no gallstone.   - Per GI, exam not concerning, hold off on HIDA scan for now    FEN: low salt diet   Ppx: SCD for DVT  Code: FULL     #Global  DVT Prophalyxis: holding in setting of coagulopathy  Lines: peripheral IV  Fluids: None  Electrolytes: Monitor and replete as needed  Nutrition: Regular diet   Code: Full   Subjective   Chief Complaint  Altered mental status     Interval History/Subjective  BM 3-4 in 24 hours. Complains of nausea, feeling tired, some chills, abdominal fullness.     Objective  Physical Exam  Temp:  [97.1 F (36.2 C)-99.6 F (37.6 C)] 98 F (36.7 C)  Heart Rate:  [97-101] 99  Resp Rate:  [17-19] 18  BP: (114-135)/(63-87) 128/77 mmHg  General: awake, alert, oriented x 2; no acute distress.  HEENT: perrla, eomi, icteric sclera oropharynx clear without lesions, mucous membranes moist  Neck: supple, no LAD, no thyromegaly, no JVD, no carotid bruits  Cardiovascular: fast rate regular rhythm, no murmurs, rubs, or gallops  Lungs: clear to auscultation bilaterally, without wheezing, rhonchi, or rales  Abdomen: tense, distended, tympanic, mild generalized tenderness, shifting dullness   Extremities: large L lateral thigh hematoma with ecchymotic skin changes, LLE diffusely tender to palpation, BLE warm to touch  Neuro: cranial nerves grossly intact, strength 5/5 in upper and lower extremities,  sensation intact  Skin: jaundice, no spider angiomata         Intake/Output Summary (Last 24 hours) at 03/04/15 0719  Last data filed at 03/03/15 1400   Gross per 24 hour   Intake    200 ml   Output    100 ml   Net    100 ml     Telemetry: NAE  Glucose: 81       Medications  Medications were reviewed.  Significant for:  Current Facility-Administered Medications   Medication Dose Route Frequency Last Rate Last Dose   . 0.9%  NaCl infusion   Intravenous PRN       . acetaminophen (TYLENOL) tablet 325 mg  325 mg Oral Q4H PRN   325 mg at 03/03/15 0535   . albumin human 25 % bag 25 g  25 g Intravenous Once   Stopped at 03/01/15 1650   . ciprofloxacin (CIPRO) 400mg  in D5W IVPB (premix)  400 mg Intravenous Q12H SCH 200 mL/hr at 03/03/15 2235 400 mg at 03/03/15 2235   . folic acid (FOLVITE) tablet 1 mg  1 mg Oral Daily   1 mg at 03/03/15 1033   . furosemide (LASIX) tablet 20 mg  20 mg Oral Daily       . lactulose (CHRONULAC) 10 GM/15ML solution 30 g  30 g Oral 4 times per day   30 g at 03/04/15 0513   . LORazepam (ATIVAN) injection 2-4 mg  2-4 mg Intravenous PRN   2 mg at 02/27/15 0206   . metoclopramide (REGLAN) injection 10 mg  10 mg Intravenous Q6H PRN       . multivitamin (MULTIVITAMIN) tablet 1 tablet  1 tablet Oral Daily   1 tablet at 03/03/15 1033   . naloxone [facility]) injection 0.2 mg  0.2 mg Intravenous PRN       . pantoprazole (PROTONIX) injection 40 mg  40 mg Intravenous BID   40 mg at 03/03/15 1705   . rifaximin (XIFAXAN) tablet 550 mg  550 mg Oral BID   550 mg at 03/03/15 2234   . spironolactone (ALDACTONE) tablet 100 mg  100 mg Oral Daily       . thiamine (B-1) tablet 100 mg  100 mg Oral Daily   100 mg at 03/03/15 1032   . traMADol (ULTRAM) tablet 50 mg  50 mg Oral Q8H PRN   50 mg at 03/04/15 0609         Labs    Recent Labs  Lab 03/04/15  0504 03/03/15  0402   SODIUM 132* 135*   POTASSIUM 4.1 3.7   CHLORIDE 103 105   CO2 19* 22   BUN 24.0 24.0  CREATININE 0.8 1.0   CALCIUM 8.4* 8.0*   ALBUMIN 2.1* 2.0*    PROTEIN, TOTAL 7.4 6.6   BILIRUBIN, TOTAL 25.6* 22.7*   ALKALINE PHOSPHATASE 89 86   ALT 30 25   AST (SGOT) 132* 108*   GLUCOSE 85 81         Recent Labs  Lab 03/04/15  0504 03/03/15  1352   WBC 14.50* 11.33*   HGB 7.1* 7.9*   HEMATOCRIT 20.5* 22.5*   PLATELETS 130* 90*         Recent Labs  Lab 03/04/15  0504 03/03/15  0402   PT 28.4* 26.4*   PT INR 2.7* 2.5*   PTT 44* 43*       Microbiology  Microbiology Results     Procedure Component Value Units Date/Time    CULTURE BLOOD AEROBIC AND ANAEROBIC [[id number]] Collected:  02/27/15 1709    Specimen Information:  Blood Updated:  03/03/15 0025    Narrative:      ORDER#: [id number]                                    ORDERED BY: Dewaine Conger  SOURCE: Blood blood                                  COLLECTED:  02/27/15 17:09  ANTIBIOTICS AT COLL.:                                RECEIVED :  02/27/15 21:53  Culture Blood Aerobic and Anaerobic        PRELIM      03/02/15 22:21  02/28/15   No Growth after 1 day/s of incubation.  03/01/15   No Growth after 2 day/s of incubation.  03/02/15   No Growth after 3 day/s of incubation.      CULTURE BLOOD AEROBIC AND ANAEROBIC [[id number]] Collected:  02/27/15 0906    Specimen Information:  Blood Updated:  03/02/15 1221    Narrative:      ORDER#: [id number]                                    ORDERED BY: Dewaine Conger  SOURCE: Blood blood                                  COLLECTED:  02/27/15 09:06  ANTIBIOTICS AT COLL.:                                RECEIVED :  02/27/15 11:42  Culture Blood Aerobic and Anaerobic        PRELIM      03/02/15 12:21  02/28/15   No Growth after 1 day/s of incubation.  03/01/15   No Growth after 2 day/s of incubation.  03/02/15   No Growth after 3 day/s of incubation.      MRSA culture [[id number]] Collected:  02/23/15 1413    Specimen Information:  Body Fluid from Nasal/Throat ASC Admission Updated:  02/24/15 1829    Narrative:      ORDER#: [id number]  ORDERED BY: MANNAN,  MOHAMME  SOURCE: Nares and Throat                             COLLECTED:  02/23/15 14:13  ANTIBIOTICS AT COLL.:                                RECEIVED :  02/23/15 18:14  Culture MRSA Surveillance                  FINAL       02/24/15 18:29  02/24/15   Negative for Methicillin Resistant Staph aureus from Nares and             Negative for Methicillin Resistant Staph aureus from Throat      Urine culture [[id number]] Collected:  02/27/15 0853    Specimen Information:  Urine from Urine, Clean Catch Updated:  02/28/15 1343    Narrative:      ORDER#: [id number]                                    ORDERED BY: Dewaine Conger  SOURCE: Urine, Clean Catch                           COLLECTED:  02/27/15 08:53  ANTIBIOTICS AT COLL.:                                RECEIVED :  02/27/15 14:23  Culture Urine                              FINAL       02/28/15 13:43  02/28/15   No growth of >1,000 CFU/ML, No further work              Imaging  Ct Abdomen Pelvis Wo Iv/ Wo Po Cont    02/28/2015    1. Stable large intramuscular hematoma surrounding left hip and upper thigh involving left gluteus musculature and lateral abductors. 2. No retroperitoneal hemorrhage. Unchanged large ascites.  Charlott Rakes, MD  02/28/2015 9:41 PM     US Abdomen Limited Single Organ    02/26/2015   Impression: Moderate amount of ascites.  Pennelope Bracken, MD  02/26/2015 3:11 PM         Case discussed with: Dr. Janece Canterbury, attending physician    Signed by: Lum Babe PGY 2

## 2015-03-04 NOTE — Progress Notes (Signed)
Attending Attestation:     I have seen and personally examined the patient.  I agree with the findings and exam as documented by Dr. Lynnea Ferrier.    Plan:  1.  Acute alcoholic hepatitis.  Continue with supportive care.   Gastroenterology team is on board.   2.  Suspected spontaneous bacterial peritonitis.  We will continue with  current antibiotic regimen.  I have discussed the case with [facility] team for  possible therapeutic and diagnostic paracentesis.  3.  Elevated INR, likely secondary to acute liver pathology.  Will consider  2 units of FFP during procedure and continue to monitor INR level.  4.  Left thigh hematoma.  Continue with supportive care.  H and H is  stable.  5.  History of alcohol abuse.  Continue to monitor for withdrawal.  6.  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  Continue with  lactulose and titrate to 2 to 3 bowel movements per day.       Case discussed with patient and also [facility].  Time spent is 35 minutes,  greater than 50% in counseling and coordination of care.          Disposition:     Today's date: 03/04/2015  Admit Date: 02/25/2015 10:25 PM  Anticipated medical stability for discharge:Red - not tomorrow - estimated month/date: 03-11-2015  Service status: Inpatient: risk of morbidity and mortality, risk of progressive disease and risk of readmission  Reason for ongoing hospitalization: hepatitis  Anticipated discharge needs: tbd     Lucilla Edin, MD

## 2015-03-04 NOTE — Progress Notes (Signed)
03/04/15 1545   Paracentesis   $ Paracentesis performed w/wo guidance? With image guidance   Paracentesis procedure start time 1430   Performing Clinician Other (Comment)  Frederich Chick NP)   Indication Ascites   Procedure status Completed   Pre paracentesis respirations 18   Pre paracentesis heart rate 92   Pre paracentesis BP 137/90 mmHg   Pre paracentesis FiO2 / O2  .21   Pre paracentesis SpO2 94 %   Paracentesis needle in time 1500   Paracentesis needle out time 1525   Total fluid collected paracentesis (ml) 3900 ml   Post paracentesis respirations 18   Post paracentesis heart rate 104   Post paracentesis BP 122/74 mmHg   Post paracentesis FiO2 / O2 .21   Post paracentesis SpO2 94 %   Unplanned admission post paracentesis No   Adverse Reactions None   Paracentesis procedure stop time 1555   Performing Departments   O2 Device performing department formula [id number]   Rn given report. Paracentesis tolerated well. VSS Labs sent by RT.

## 2015-03-04 NOTE — Student Progress (Signed)
MEDICINE STUDENT PROGRESS NOTE    Date Time: 03/04/2015 6:30 AM  Patient Name: Gregory [name]  Attending Physician: Lucilla Edin, MD    Subjective   CC: liver failure    Subjective: 1 BM in last 24 hours. Reports 7/10 left hip pain. Received 75 mg tramadol in last 24 hours. Patient states he "just feels sick" today and had one episode of emesis overnight.     Review of Systems:   Denies n/sob/cp  Reports left thigh pain, 1 episode of emesis overnight    Physical Exam:     VITAL SIGNS PHYSICAL EXAM   Temp:  [97.1 F (36.2 C)-99.6 F (37.6 C)] 98 F (36.7 C)  Heart Rate:  [97-101] 99  Resp Rate:  [17-19] 18  BP: (114-135)/(63-87) 128/77 mmHg        Intake/Output Summary (Last 24 hours) at 03/04/15 0630  Last data filed at 03/03/15 1400   Gross per 24 hour   Intake    200 ml   Output    100 ml   Net    100 ml    General: NAD  Neuro: A+Ox4, follows commands  Lungs: CTAB anteriorly  Cardiovascular: Normal S1/S2 without murmurs, rubs, or gallops  Abdomen: Non-tender, distended, tense  Extremities: Resting tremor, left thigh hematoma decreased from yesterday, wiggles toes, pitting edema in left LE  Skin: Jaundice       Lines (list foley, central lines, picc lines, and date on insertion):   PIV x2, placed 6/5  Condom catheter, placed 6/9  R PICC, placed 6/11    Meds (essential medications - list Abx days, group by class):     Scheduled Meds:  Current Facility-Administered Medications   Medication Dose Route Frequency   . albumin human  25 g Intravenous Once   . ciprofloxacin  400 mg Intravenous Q12H SCH   . folic acid  1 mg Oral Daily   . lactulose  30 g Oral 4 times per day   . multivitamin  1 tablet Oral Daily   . pantoprazole  40 mg Intravenous BID   . rifaximin  550 mg Oral BID   . thiamine  100 mg Oral Daily     Continuous Infusions:     PRN Meds:.sodium chloride, acetaminophen, LORazepam, metoclopramide, naloxone, traMADol      Labs:     Labs (last 72 hours): (ensure neat format, include any other labs not  listed below)       Recent Labs  Lab 03/04/15  0504 03/03/15  1352   WBC 14.50* 11.33*   HGB 7.1* 7.9*   HEMATOCRIT 20.5* 22.5*   PLATELETS 130* 90*         Recent Labs  Lab 03/03/15  0402 03/02/15  0312   PT 26.4* 24.4*   PT INR 2.5* 2.2*   PTT 43* 42*      Recent Labs  Lab 03/04/15  0504 03/03/15  0402   SODIUM 132* 135*   POTASSIUM 4.1 3.7   CHLORIDE 103 105   CO2 19* 22   BUN 24.0 24.0   CREATININE 0.8 1.0   CALCIUM 8.4* 8.0*   ALBUMIN 2.1* 2.0*   PROTEIN, TOTAL 7.4 6.6   BILIRUBIN, TOTAL 25.6* 22.7*   ALKALINE PHOSPHATASE 89 86   ALT 30 25   AST (SGOT) 132* 108*   GLUCOSE 85 81                  Microbiology (update daily)  MRSA swab (  6/6): negative  Blood culture (6/10): NGTD  Urine culture (6/10): No growth of >1,000 CFU/mL    Imaging (summarize)  CT Abdomen (6/11): 1. Stable large intramuscular hematoma surrounding left hip and upper thigh involving left gluteus musculature and lateral abductors. 2. No retroperitoneal hemorrhage. Unchanged large ascites.  US Abdomen (6/9): Moderate amount of ascites.  CXR (6/7): Basal opacities and left pleural effusion increased.  RUQ [place] (6/6): 1. The liver demonstrates a coarsened echotexture with a mildly nodular  contour. There is no intrahepatic biliary ductal dilatation or mass. 2. A moderate amount of mobile sludge is identified within a distended gallbladder. There is no gallstone. The gallbladder wall is edematous and may be partially due to the surrounding ascites. If there is high clinical concern of acute cholecystitis a HIDA scan is recommended. 3. There is no hydronephrosis within the right kidney. There is a 1.0 cm benign appearing upper pole cyst. 4. Ascites. The remainder is as above.  CT (6/5): 1. No renal or ureteral calculi or hydronephrosis. 2. Normal lumbar spine. 3. Left pelvic and hip intramuscular hematoma. 4. Cirrhotic appearing liver with abdominal varices and moderate to large ascites. 5. Left lower lobe atelectasis and moderate pleural effusion.    CXR (6/5): 1. Left basilar airspace disease and right basilar atelectasis. 2. Left pleural fluid/thickening.    Assessment (brief and succinct one liner with diagnoses, not symptoms):     Mr. Brabec is a 39 year old male with PMH of alcohol abuse and alcoholic cirrhosis who presents with AMS, likely due to hepatic encephalopathy, anemia due to large left thigh hematoma, now with likely SBP.     Plan (by active issues, prioritized.  Can just list the inactive issues):   # Presumed SBP  - Afebrile for over 3 days  - WBC trending up (13.37 --> 14.50)  - Diagnostic paracentesis deferred due to INR >2  - Per GI: recommend diagnostic paracentesis and check cell count, gram stain, culture, cytology, and albumin  - S/p 3 days cipro. Restart ceftriaxone per GI    # Ascites  - Moderate ascites confirmed on [place] and abdominal CT  - Abdomen is distended  - INR trending up at 2.7  - Per procedure team: INR should be <2, paracentesis deferred yesterday due to elevated INR  - Per GI: diagnostic paracentesis and check cell count, gram stain, cytology, and albumin  - Consider vitamin K and FFP    # Hyponatremia, likely dilutional in setting of ascites  - Started furosemide 20 mg PO daily  - Per GI, spironolactone was started  - Patient was started on a low sodium diet to decrease fluid retention    # Anemia, due to acute blood loss  - Recent fall with large left sided hip hematoma - could be the source of anemia but cannot rule out GI etiology  - MCV 92.8 on arrival, not suggestive of megaloblastic anemia  - No evidence of hematemesis and no report of hematemesis. No documented history of varices available  - Hgb 6.5 on arrival   - CT abdomen 6/11 not suggestive of retroperitoneal bleed  - Hgb 8.1 --> 7.1, hct 22.9 --> 20.5  - Placed order for 1 U pRBC  - Every q12 hr CBCs, transfuse for hgb <22  - Continue Protonix IV twice daily    # Left thigh hematoma  - S/p mechanical fall last weekend  - CT at outside hospital showed left hip  intramuscular hematoma  - Left pedal pulse  not palpable, but detectable by doppler  - Large left thigh hematoma possibly cause of anemia  - Continue tramadol PRN  - Continue to monitor for signs of compartment syndrome    # Transaminitis likely secondary to alcoholic cirrhosis, AST and ALT trending up (132/30)  - CT at OSH showed cirrhotic appearing liver with abdominal varices and moderate to large ascites  - AST and AST elevated in alcoholic pattern, trending down  - Ceruloplasmin: 15. Falsely low ceruloplasmin levels may be observed in any protein deficiency state.  - HAV IgM, HBV and HCV were negative  - ANA, AMA: negative  - AFP: normal (7.1)  - Meld score 31, 52.6% estimated 45-month mortality  - Per GI: Obtain infectious workup (UA, urine culture, blood culture x2, CXR). If these are negative, consider prednisolone  - Maddrey's Discriminant Function: 92.8 --> suggests would benefit from steroids (after ruling out infection)    # Pleural effusion  - CXR 6/7: Basal opacities and left pleural effusion increased.  - Received 20 mg furosemide yesterday.   - Re-check CXR    # Active alcohol abuse  - Received 1 banana bag  - Continue daily multivitamin, folic acid, and thiamine  - Continue CIWA protocol and provide ativan as needed  - Per CATS: Recommend patient follow up with [facility], [facility] and can be reached at [phone].     # Sinus Tachycardic, high 90s to low 100s  - Likely multifactorial: pain, blood loss into thigh hematoma, and EtOH withdrawal  - Continue CIWA and monitor fluid status    # Altered mental status, improved  - Differential diagnosis: hepatic encephalopathy vs. Wernike- Korsakoff vs. EtOH withdrawal vs. Pain meds  - Continue neuro checks q4 hrs  - Continue sitter to continue to reorient  - Continue haldol PRN  - Avoid sedating meds as much as possible (reduce tramadol dose)  - Titrate lactulose to 2-3 BMs daily  - Continue rifaximin    # Thrombocytopenia likely secondary to  cirrhosis vs dillutional, improving  - Platelets increasing, 67 on presentation, now 130  - Continue CBCs q12 hours    # Hyperbilirubinemia likely secondary to alcoholic cirrhosis vs. Hematoma resorption    # Tremor  - Differential diagnosis: asterixis due to hepatic encephalopathy vs. Acute EtOH withdrawal  - No asterixis on exam, likely due to EtOH withdrawal    # AKI, likely hepatorenal syndrome, resolved  - IVF were D/C 2/2 receiving so many blood products    # Nausea and vomiting, could be due to colitis found on CT scan vs. Cholecystitis, resolved   - RUQ [place] (6/5): moderate mobile sludge in a distended gallbladder.   - Protonix as above  - Zofran as needed  - Follow up GI recs    # Metabolic acidosis with elevated lactate, resolved  - Expect some level of lactic acidosis given liver failure  - Lactic acid is trending down with IV fluids    DVT prophylaxis: SCDs  FEN/GI: Regular (if retaining fluid, can switch to low sodium diet), If po intake <75% at meals, add Ensure Enlive BID, replete electrolytes PRN  Code status: full code    Disposition (what needs to be accomplished before the patient returns home):   Discharge to SNF    Signed by: Dewitt Rota, BS

## 2015-03-04 NOTE — Plan of Care (Addendum)
Problem: Health Promotion  Goal: Knowledge - disease process  Extent of understanding conveyed about a specific disease process.   Outcome: Progressing  Pt is alert oriented VS stable. Noted low hemoglobin, given 2 units FFP tranfusing ,verified by another RN, no  side effects reactions noted. His abdomen distended, hypoactive bowel sounds, with bowel movements soft 1x medium amount brownish color. He complaint of pain, prn pain medication given with relief. Safety measures in place lilke floor mats, call lights and personal belongings within reach, bed alarm, bed in lowest position, hourly rounding . Able to eat 50% of lunch. Needs attended. Will continue to monitor.     1400 :Paracentesis done on pt,  per report 3.9 Litersfluids taken out  tolerated. No complaint of any pain or discomfort after. Vs stable. Will continue to monitor.

## 2015-03-05 ENCOUNTER — Ambulatory Visit (INDEPENDENT_AMBULATORY_CARE_PROVIDER_SITE_OTHER): Payer: Charity | Admitting: "Endocrinology

## 2015-03-05 ENCOUNTER — Inpatient Hospital Stay: Payer: Charity

## 2015-03-05 LAB — CBC
Hematocrit: 23 % — ABNORMAL LOW (ref 42.0–52.0)
Hgb: 8.1 g/dL — ABNORMAL LOW (ref 13.0–17.0)
MCH: 34 pg — ABNORMAL HIGH (ref 28.0–32.0)
MCHC: 35.2 g/dL (ref 32.0–36.0)
MCV: 96.6 fL (ref 80.0–100.0)
MPV: 10.4 fL (ref 9.4–12.3)
Nucleated RBC: 1 /100 WBC (ref 0–1)
Platelets: 111 10*3/uL — ABNORMAL LOW (ref 140–400)
RBC: 2.38 10*6/uL — ABNORMAL LOW (ref 4.70–6.00)
RDW: 25 % — ABNORMAL HIGH (ref 12–15)
WBC: 11.46 10*3/uL — ABNORMAL HIGH (ref 3.50–10.80)

## 2015-03-05 LAB — BODY FLUID PATH REVIEW-

## 2015-03-05 LAB — BASIC METABOLIC PANEL
BUN: 21 mg/dL (ref 9.0–28.0)
CO2: 21 mEq/L — ABNORMAL LOW (ref 22–29)
Calcium: 8.4 mg/dL — ABNORMAL LOW (ref 8.5–10.5)
Chloride: 104 mEq/L (ref 100–111)
Creatinine: 0.9 mg/dL (ref 0.7–1.3)
Glucose: 85 mg/dL (ref 70–100)
Potassium: 3.2 mEq/L — ABNORMAL LOW (ref 3.5–5.1)
Sodium: 133 mEq/L — ABNORMAL LOW (ref 136–145)

## 2015-03-05 LAB — PT AND APTT
PT INR: 2.3 — ABNORMAL HIGH (ref 0.9–1.1)
PT: 24.6 s — ABNORMAL HIGH (ref 12.6–15.0)
PTT: 45 s — ABNORMAL HIGH (ref 23–37)

## 2015-03-05 LAB — HEPATIC FUNCTION PANEL
ALT: 26 U/L (ref 0–55)
AST (SGOT): 117 U/L — ABNORMAL HIGH (ref 5–34)
Albumin/Globulin Ratio: 0.4 — ABNORMAL LOW (ref 0.9–2.2)
Albumin: 2 g/dL — ABNORMAL LOW (ref 3.5–5.0)
Alkaline Phosphatase: 85 U/L (ref 38–106)
Bilirubin Direct: 16.7 mg/dL — ABNORMAL HIGH (ref 0.0–0.5)
Bilirubin Indirect: 9 mg/dL — ABNORMAL HIGH (ref 0.0–1.1)
Bilirubin, Total: 25.7 mg/dL — ABNORMAL HIGH (ref 0.2–1.2)
Globulin: 5 g/dL — ABNORMAL HIGH (ref 2.0–3.6)
Protein, Total: 7 g/dL (ref 6.0–8.3)

## 2015-03-05 LAB — GFR: EGFR: >60

## 2015-03-05 MED ORDER — LACTULOSE 10 GM/15ML PO SOLN
ORAL | Status: AC
Start: 2015-03-05 — End: 2015-03-05
  Administered 2015-03-05: 20 g
  Filled 2015-03-05: qty 60

## 2015-03-05 MED ORDER — PREDNISOLONE SODIUM PHOSPHATE 15 MG/5 ML PO SOLN CUSTOM DOSE
40.0000 mg | Freq: Every day | ORAL | Status: DC
Start: 2015-03-05 — End: 2015-03-10
  Administered 2015-03-05 – 2015-03-10 (×6): 40 mg via ORAL
  Filled 2015-03-05 (×6): qty 15

## 2015-03-05 MED ORDER — LACTULOSE 10 GM/15ML PO SOLN
ORAL | Status: AC
Start: 2015-03-05 — End: 2015-03-05
  Administered 2015-03-05: 30 g
  Filled 2015-03-05: qty 60

## 2015-03-05 MED ORDER — POTASSIUM CHLORIDE CRYS ER 20 MEQ PO TBCR
40.0000 meq | EXTENDED_RELEASE_TABLET | ORAL | Status: AC
Start: 2015-03-05 — End: 2015-03-05
  Administered 2015-03-05 (×2): 40 meq via ORAL
  Filled 2015-03-05: qty 2

## 2015-03-05 MED ORDER — TRAMADOL HCL 50 MG PO TABS
ORAL_TABLET | ORAL | Status: AC
Start: 2015-03-05 — End: 2015-03-05
  Filled 2015-03-05: qty 1

## 2015-03-05 MED ORDER — POTASSIUM CHLORIDE CRYS ER 20 MEQ PO TBCR
EXTENDED_RELEASE_TABLET | ORAL | Status: AC
Start: 2015-03-05 — End: 2015-03-05
  Filled 2015-03-05: qty 2

## 2015-03-05 NOTE — Progress Notes (Signed)
[facility]  PROGRESS NOTE  [facility] call: [phone], [phone]  [facility] call: [phone]  After hours call [phone]    Date Time: 03/05/2015 5:29 PM  Patient Name: Gregory [name], Gregory [name]      Chief Complaint:   Alcoholic hepatitis/cirrhosis    Assessment and Plan:   Assessment: 39 yo M with EtOH abuse presented to [facility] ED with abdominal pain, recent fall and found to have anemia and jaundice. CT with large left hip hematoma, ascites.     1. Anemia in setting of worsening large left hip hematoma, likely main source.No report of overt GI bleeding at this time.  2. Alcoholic hepatitis and underlying known cirrhosis.DF is 55. MELD 27 (6/12). LFTs stable. Hepatitis BsAg, Hep C Ab neg, Ceruloplasmin 15, iron/TIBC normal; ferritin 1988. AFP 7.1. ANA, mitochondrial Ab negative  3. Alcohol abuse, active until admission  4.  Fever 6/11.  Cultures negative thus far.  Cell count from paracentesis 6/15 not c/w SBP, but this is in setting of ABX    Plan:   1. Continue course of broad spectrum ABX for presumed SBP  2. PPI  3. Follow H&H and continue to monitor for overt GI bleeding. 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 Follow LFTs/INR daily  5. EtOH abstinence  6. Lactulose TID, titrate to 2-3 soft stools daily  7.Xifaxan 550mg  BID  8. Encourage nutrition - pt states he is trying to eat mainly fruits and vegetables, encouraged him to eat a balanced diet, which will help improve his outcome  9. Aldactone 100mg  daily and Lasix 20mg  daily  10. Low sodium diet  11. Will start Prednisolone 40mg  x 4 weeks     Subjective:   S/p para with 3.6L removed, cell count neg for SBP.  No acute events.  Pt states he doesn't like the food here and is eating just fruits and vegetables "since they are healthy."       Medications:     Current Facility-Administered Medications   Medication Dose Route Frequency   . cefTRIAXone  2 g Intravenous  Q24H SCH   . folic acid  1 mg Oral Daily   . furosemide  20 mg Oral Daily   . lactulose  30 g Oral 4 times per day   . multivitamin  1 tablet Oral Daily   . pantoprazole  40 mg Intravenous BID   . potassium chloride       . prednisoLONE  40 mg Oral Daily   . rifaximin  550 mg Oral BID   . spironolactone  100 mg Oral Daily   . thiamine  100 mg Oral Daily   . traMADol           Review of Systems:   Complete 12 Point ROS negative or normal except those mentioned in HPI above.    Physical Exam:     Filed Vitals:    03/05/15 0806   BP: 141/79   Pulse: 91   Temp: 98.6 F (37 C)   Resp: 17   SpO2: 96%   BP 141/79 mmHg  Pulse 91  Temp(Src) 98.6 F (37 C) (Oral)  Resp 17  Ht 1.93 m (6\' 4" )  Wt 81.647 kg (180 lb)  BMI 21.92 kg/m2  SpO2 96%    General appearance: Chronically-ill appearing, appears stated age and in NAD, tremulous  Eyes: +scleral icterus, pink conjunctivae, no ptosis  ENMT: mucous membranes moist,  nose and ears appear normal.  Oropharynx clear.  Chest: Non labored respirations, no audible wheezing, no clubbing or cyanosis  CV:  Tachycardic, + B/L ankle edema  Abdomen: softly distended, nontender, no masses or organomegaly  Skin: +Jaundice, normal turgor, no rashes, no suspicious skin lesions noted  Neuro: CN II-XII grossly intact.  No gross movement disorders noted. + asterixis  Mental status: Appropriate affect, alert and oriented x 3    Labs:     Recent Labs      03/05/15   0451  03/04/15   1631   WBC  11.46*  12.53*   HGB  8.1*  8.8*   HEMATOCRIT  23.0*  25.0*   PLATELETS  111*  102*   MCV  96.6  98.0       Recent Labs      03/05/15   0451  03/04/15   0504   SODIUM  133*  132*   POTASSIUM  3.2*  4.1   CHLORIDE  104  103   CO2  21*  19*   BUN  21.0  24.0   CREATININE  0.9  0.8   GLUCOSE  85  85   CALCIUM  8.4*  8.4*       Recent Labs      03/05/15   0451  03/04/15   0504   AST (SGOT)  117*  132*   ALT  26  30   ALKALINE PHOSPHATASE  85  89   BILIRUBIN, TOTAL  25.7*  25.6*   BILIRUBIN, DIRECT  16.7*   15.3*   PROTEIN, TOTAL  7.0  7.4   ALBUMIN  2.0*  2.1*       Recent Labs      03/05/15   0451  03/04/15   1631   PTT  45*  44*   PT  24.6*  23.2*   PT INR  2.3*  2.1*        Radiology:   Radiological Procedure reviewed:  [place] RUQ 02/23/15:   1. The liver demonstrates a coarsened echotexture with a mildly nodular contour. There is no intrahepatic biliary ductal dilatation or mass.  2. A moderate amount of mobile sludge is identified within a distended gallbladder. There is no gallstone. The gallbladder wall is edematous and may be partially due to the surrounding ascites. If there is high clinical concern of acute cholecystitis a HIDA scan is recommended.  3. There is no hydronephrosis within the right kidney. There is a 1.0 cm benign appearing upper pole cyst.  4. Ascites. The remainder is as above.    CT A/P without contrast 02/28/15:  1. Stable large intramuscular hematoma surrounding left hip and upper thigh involving left gluteus musculature and lateral abductors.  2. No retroperitoneal hemorrhage. Unchanged large ascites.

## 2015-03-05 NOTE — Progress Notes (Signed)
Attending Attestation:     I have seen and personally examined the patient.  I agree with the findings and exam as documented by Dr. Lynnea Ferrier.    Plan:  Acute alcoholic hepatitis.  Continue with supportive care.  Discussed with  GI team for possible consideration of steroid treatment now that infection  has been ruled out.     Suspected spontaneous bacterial peritonitis.  Currently, the patient is on  Rocephin and status post paracentesis.  Cytology does not confirm acute  bacterial peritonitis.  On the other hand, patient has been on IV  antibiotic.  Current results might have been influenced by IV treatment.   We will continue with Rocephin for a total of 7-day treatment.     Elevated INR, likely secondary to his pathology.  There is no evidence of  acute bleed.  We will continue to monitor.       Left thigh hematoma at the current time is stable.  We will continue to  monitor CBC.  Case discussed with patient in detail as well as GI team.     Time spent is 36 minutes,  greater than 50% in counseling and coordination  of care.          Disposition:     Today's date: 03/05/2015  Admit Date: 03/03/2015  9:55 PM  Anticipated medical stability for discharge:Red - not tomorrow - estimated month/date: 03-12-2015  Service status: Inpatient: risk of morbidity and mortality, risk of progressive disease and risk of readmission  Reason for ongoing hospitalization: hepatitis  Anticipated discharge needs: tbd     Lucilla Edin, MD

## 2015-03-05 NOTE — PT Progress Note (Signed)
[facility]   Physical Therapy Cancellation Note    Patient:  Sohan Potvin MRN#:  [id number]  Unit:  [facility] [address]OWER 10 EAST Room/Bed:  F1016/F1016.01    03/05/2015  Time: 1631    Pt not seen for physical therapy secondary to pt with pending LLE doppler. Discussed with RN.    Marcie Bal, PT DPT 03/05/2015 4:32 PM

## 2015-03-05 NOTE — Plan of Care (Signed)
Problem: Pain  Goal: Patient's pain/discomfort is manageable  Outcome: Progressing  Pain to left side abdomen and left leg manageable with prn po tramadol 6/10 pain down to 3/10 which is pt stated goal. Able to reposition self.    Problem: Psychosocial and Spiritual Needs  Goal: Demonstrates ability to cope with hospitalization/illness  Outcome: Progressing  Able to verbalize needs/ concerns.    Problem: Moderate/High Fall Risk Score >5  Goal: Patient will remain free of falls  Outcome: Progressing  High fall risk per assessment. Fall mats x2 bedside, bed alarm on, 3 siderails up, non skid socks on. Call light in reach, hourly rounding.    Comments:   AOx4. Skin jaundice. Abd distended, hypo bowel sounds. CIWA 0 this shift and discontinued, no IV ativan given.  IV antibiotics per order. Loose BM x3 and 1200 lactulose not given.  Urine dark cola color, urinal to void.  Mostly continent with episodes of bowel incontinence r/t lactulose.  Incontinence care provided prn.  Linens and gown changed.  Rounded with attending and team bedside.  Seizure precautions in place, siderails padded.  Fall precautions in place. K+ replacement per order.  Fall prevention brochure and medication side effects sheet reviewed. SCDs on BLE. No acute distress, no sob, no cp, no headache, no dizziness. Continue plan of care.

## 2015-03-05 NOTE — Progress Notes (Signed)
MEDICINE PROGRESS NOTE    Date Time: 03/05/2015 6:11 AM  Patient Name: Gregory [name]  Attending Physician: Lucilla Edin, MD  Team Contact:   Medicine Resident Team A  Spectra: [phone]  Night: [phone]      Assessment:     Active Hospital Problems    Diagnosis   . Hematoma of left lower extremity   . Lactic acidosis   . Anemia, unspecified type   . Liver failure   . Liver failure without hepatic coma, unspecified chronicity   . Alcohol withdrawal, with unspecified complication   . Tachycardia   . Altered mental status, unspecified altered mental status type   . Increased ammonia level       39 year-old-male with history of alcoholic cirrhosis, now likely alcoholic hepatitis, SBP, and L thigh hematoma.     Plan:   #LFT elevation and hepatic cirrhosis  Likely alcoholic hepatitis. Discriminant score 70. HepBsAg, HepC Ab neg. ANA, ASMA, AFP neg. Paracentesis 6/15 showing only 6 PMNs (had already been on antibiotics), and SAAG 1.6 consistent with portal hypertension.   -consider starting prednisolone or pentoxyfilene  -Lactulose, titrate to 2-3 soft stools daily  -Rifaximin 550mg  BID  -aldactone and lasix daily     #Fevers   Resolved likely due to SBP   -monitor for further fever  -empirically treating SBP with ceftriaxone 2 gm daily, plan to complete 7 days of antibiotics through 6/18    #Anemia  Stable. Likely secondary to  Resolving L thigh hematoma due to recent fall noted on OSH CT.   - S/p multiple units PRBC  - Monitor CBC daily    #L thigh hematoma  Recent fall with left sided hip trauma and L thigh hematoma noted on OSH CT  - Low dose tylenol and tramadol for pain     #Active alcohol abuse  Has not been requiring benzos.  - Discontinue CIWA protocol  - PO multivitamin, folic acid, and thiamine  - CATS consult eventually     #Altered mental status  Improved.  Likely from dilaudid and ativan given outside hospital but can rule out hepatic encephalopathy. Improving slowly.   - Lactulose and rifaximin as  above. Continue CIWA and avoid sedating meds as much as possible (reduce tramadol dose).     #Sinus tachycardia   Likely multi-factorial, from pain, anxiety, EtOH withdrawal.   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 Transfuse as above.     #Distended gallbladder on outside CT scan  6/6 RUQ US showed mobile sludge seen within a distended gallbladder and edematous gallbladder wall is edematous (which may be partially due to the surrounding ascites), no gallstone.   - Per GI, exam not concerning, hold off on HIDA scan for now    #Global  DVT Prophalyxis: holding in setting of coagulopathy  Lines: peripheral IV  Fluids: None  Electrolytes: Monitor and replete as needed  Nutrition: Regular diet, 2gm sodium    Code: Full   Subjective   Chief Complaint  Altered mental status     Interval History/Subjective  Paracentesis with 3.6L bilious fluid removed yesterday.  Feels that abdomen is less full but that fluid his artery started to reaccumulate.  No complaints this morning aside from leg pain, some chills, abdominal fullness.     Objective   Physical Exam  Temp:  [96.2 F (35.7 C)-98.9 F (37.2 C)] 98.2 F (36.8 C)  Heart Rate:  [88-98] 88  Resp Rate:  [  16-20] 16  BP: (115-141)/(68-96) 130/86 mmHg  General: awake, alert, oriented x 2; no acute distress.  HEENT: perrla, eomi, icteric sclera oropharynx clear without lesions, mucous membranes moist  Neck: supple, no LAD, no thyromegaly, no JVD, no carotid bruits  Cardiovascular: fast rate regular rhythm, no murmurs, rubs, or gallops  Lungs: clear to auscultation bilaterally, without wheezing, rhonchi, or rales  Abdomen: tense, distended, tympanic, mild generalized tenderness, shifting dullness   Extremities: large L lateral thigh hematoma with ecchymotic skin changes, LLE diffusely tender to palpation, BLE warm to touch  Neuro: cranial nerves grossly intact, strength 5/5 in upper and lower extremities, sensation  intact  Skin: jaundice, no spider angiomata         Intake/Output Summary (Last 24 hours) at 03/05/15 0611  Last data filed at 03/04/15 1545   Gross per 24 hour   Intake    452 ml   Output   3900 ml   Net  -3448 ml     Telemetry: None  Glucose: 85       Medications  Medications were reviewed.  Significant for:  Current Facility-Administered Medications   Medication Dose Route Frequency Last Rate Last Dose   . 0.9%  NaCl infusion   Intravenous PRN       . 0.9%  NaCl infusion   Intravenous PRN       . acetaminophen (TYLENOL) tablet 325 mg  325 mg Oral Q4H PRN   325 mg at 03/03/15 0535   . albumin human 25 % bag 25 g  25 g Intravenous Once   Stopped at 03/01/15 1650   . cefTRIAXone (ROCEPHIN) 2 g in sodium chloride 0.9 % 100 mL IVPB mini-bag plus  2 g Intravenous Q24H SCH 200 mL/hr at 03/04/15 0909 2 g at 03/04/15 0909   . folic acid (FOLVITE) tablet 1 mg  1 mg Oral Daily   1 mg at 03/04/15 0909   . furosemide (LASIX) tablet 20 mg  20 mg Oral Daily   20 mg at 03/04/15 0909   . lactulose (CHRONULAC) 10 GM/15ML solution 30 g  30 g Oral 4 times per day   30 g at 03/04/15 1705   . LORazepam (ATIVAN) injection 2-4 mg  2-4 mg Intravenous PRN   2 mg at 02/27/15 0206   . metoclopramide (REGLAN) injection 10 mg  10 mg Intravenous Q6H PRN       . multivitamin (MULTIVITAMIN) tablet 1 tablet  1 tablet Oral Daily   1 tablet at 03/04/15 0909   . naloxone [facility]) injection 0.2 mg  0.2 mg Intravenous PRN       . pantoprazole (PROTONIX) injection 40 mg  40 mg Intravenous BID   40 mg at 03/04/15 1705   . rifaximin (XIFAXAN) tablet 550 mg  550 mg Oral BID   550 mg at 03/04/15 2217   . spironolactone (ALDACTONE) tablet 100 mg  100 mg Oral Daily   100 mg at 03/04/15 0909   . thiamine (B-1) tablet 100 mg  100 mg Oral Daily   100 mg at 03/04/15 0909   . traMADol (ULTRAM) tablet 50 mg  50 mg Oral Q8H PRN   50 mg at 03/04/15 2217         Labs    Recent Labs  Lab 03/04/15  0504 03/03/15  0402   SODIUM 132* 135*   POTASSIUM 4.1 3.7   CHLORIDE  103 105   CO2 19* 22   BUN 24.0 24.0  CREATININE 0.8 1.0   CALCIUM 8.4* 8.0*   ALBUMIN 2.1* 2.0*   PROTEIN, TOTAL 7.4 6.6   BILIRUBIN, TOTAL 25.6* 22.7*   ALKALINE PHOSPHATASE 89 86   ALT 30 25   AST (SGOT) 132* 108*   GLUCOSE 85 81         Recent Labs  Lab 03/05/15  0451 03/04/15  1631   WBC 11.46* 12.53*   HGB 8.1* 8.8*   HEMATOCRIT 23.0* 25.0*   PLATELETS 111* 102*         Recent Labs  Lab 03/05/15  0451 03/04/15  1631   PT 24.6* 23.2*   PT INR 2.3* 2.1*   PTT 45* 44*       Microbiology  Microbiology Results     Procedure Component Value Units Date/Time    CULTURE BLOOD AEROBIC AND ANAEROBIC [[id number]] Collected:  02/27/15 1709    Specimen Information:  Blood Updated:  03/05/15 0021    Narrative:      ORDER#: [id number]                                    ORDERED BY: Dewaine Conger  SOURCE: Blood blood                                  COLLECTED:  02/27/15 17:09  ANTIBIOTICS AT COLL.:                                RECEIVED :  02/27/15 21:53  Culture Blood Aerobic and Anaerobic        FINAL       03/05/15 00:21  03/05/15   No growth after 5 days of incubation.      CULTURE BLOOD AEROBIC AND ANAEROBIC [[id number]] Collected:  02/27/15 0906    Specimen Information:  Blood Updated:  03/04/15 1421    Narrative:      ORDER#: [id number]                                    ORDERED BY: Dewaine Conger  SOURCE: Blood blood                                  COLLECTED:  02/27/15 09:06  ANTIBIOTICS AT COLL.:                                RECEIVED :  02/27/15 11:42  Culture Blood Aerobic and Anaerobic        FINAL       03/04/15 14:21  03/04/15   No growth after 5 days of incubation.      Culture + Gram Azzie Glatter Body Fluid [[id number]] Collected:  03/04/15 1529    Specimen Information:  Body Fluid from Peritoneal Fluid (Abdominal) Updated:  03/04/15 2132    Narrative:      ORDER#: [id number]                                    ORDERED BY: Nancie Neas  SOURCE:  Peritoneal Fluid (Abdominal) peritoneal fluidCOLLECTED:  03/04/15  15:29  ANTIBIOTICS AT COLL.:                                RECEIVED :  03/04/15 18:53  Stain, Gram                                FINAL       03/04/15 21:32  03/04/15   No WBCs or organisms seen  Culture and Gram Stain, Aerobic, Body FluidPENDING      MRSA culture [[id number]] Collected:  02/23/15 1413    Specimen Information:  Body Fluid from Nasal/Throat ASC Admission Updated:  02/24/15 1829    Narrative:      ORDER#: [id number]                                    ORDERED BY: Shawnie Pons  SOURCE: Nares and Throat                             COLLECTED:  02/23/15 14:13  ANTIBIOTICS AT COLL.:                                RECEIVED :  02/23/15 18:14  Culture MRSA Surveillance                  FINAL       02/24/15 18:29  02/24/15   Negative for Methicillin Resistant Staph aureus from Nares and             Negative for Methicillin Resistant Staph aureus from Throat      Urine culture [[id number]] Collected:  02/27/15 0853    Specimen Information:  Urine from Urine, Clean Catch Updated:  02/28/15 1343    Narrative:      ORDER#: [id number]                                    ORDERED BY: Dewaine Conger  SOURCE: Urine, Clean Catch                           COLLECTED:  02/27/15 08:53  ANTIBIOTICS AT COLL.:                                RECEIVED :  02/27/15 14:23  Culture Urine                              FINAL       02/28/15 13:43  02/28/15   No growth of >1,000 CFU/ML, No further work                Imaging  Ct Abdomen Pelvis Wo Iv/ Wo Po Cont    02/28/2015    1. Stable large intramuscular hematoma surrounding left hip and upper thigh involving left gluteus musculature and lateral abductors. 2. No retroperitoneal hemorrhage. Unchanged large ascites.  Charlott Rakes, MD  02/28/2015 9:41 PM  US Abdomen Limited Single Organ    02/26/2015   Impression: Moderate amount of ascites.  Pennelope Bracken, MD  02/26/2015 3:11 PM         Case discussed with: Dr. Janece Canterbury, attending physician    Signed by: Shaune Pollack PGY-1

## 2015-03-05 NOTE — OT Plan of Care Note (Signed)
[facility]   Occupational Therapy Treatment     Patient: Gregory [name]    MRN#: [id number]   Unit: [facility] [address]  Bed: F1016/F1016.01      Discharge Recommendations:   Discharge Recommendation: SNF   DME Recommended for Discharge: Wheelchair-manual, Front wheel walker, BSC, Tub transfer bench    If SNF recommended discharge disposition is not available, patient will need max hands-on assist for completion of ADLs, functional mobility, safety awareness, above-listed equipment, and home OT.     Assessment:   Patient was received seated in bed, initially stated reluctance to moving to bedside chair but expressed desire to use the restroom.  Inspection revealed edema in L LE  Functional transfers show improvement, needing only verbal cues/supervison for rolling and supine-sit, min A and RW for sit/stand.  Ambulated to restroom with RW and CGAx2.  Toileted with supervision, performed perineal care independently. Ambulated to bedside chair, stated satisfaction with improvement with mobility and increased desire to work with PT on walking in the hallway later in the day.  Patient doffed and donned R sock supervision, L sock max A secondary to pain in LLE.  Edema in LLE reported to Dr. Janece Canterbury at completion of session. Patient is progressing toward goals.    Treatment Activities: ADL training, functional mobility    Educated the patient to role of occupational therapy, plan of care, goals of therapy and safety with mobility and ADLs.    Plan:    OT Frequency Recommended: 2-3x/wk     Continue plan of care.       Precautions and Contraindications:   Fall risk    Updated Medical Status/Imaging/Labs:  Chart reviewed, paracentesis performed 03/04/15 with 3.6L fluid removed.  L thigh hematoma stable.    Subjective: I want to use the bathroom   Patient's medical condition is appropriate for Occupational Therapy intervention at this time.  Patient is agreeable to participation in the therapy  session. Nursing clears patient for therapy.    Pain:   Scale: not rated  Location: LLE  Intervention: informed MD of edema LLE    Objective:   Patient is in bed with telemetry, SCD's and peripheral IV in place.edema LLE      Cognition  Alert and oriented throughout session, followed directions, verbal responses clear and appropriate.    Functional Mobility  Rolling:  supervision  Supine to Sit: supervision  Sit to Stand: min A with RW  Transfers: min A with RW    Balance  Static Sitting: supervision  Dynamic Sitting: SBA  Static Standing: CGA w/RW  Dynamic Standing: min/mod A    Self Care and Home Management  Toileting: supervision,donning and doffing of socks right with supervision ,left max A    Therapeutic Exercises  With activity    Participation: good, improved motivation and engagement today  Endurance: improving, tolerated ambulation well    Patient left with call bell within reach, all needs met, SCDs ON, fall mat in place, bed alarm NA, chair alarm ON and all questions answered. RN notified of session outcome and patient response.     Goals:  Time For Goal Achievement: 5 visits  ADL Goals  Patient will groom self: Independent, 5 visits, at sinkside, without AE  Patient will dress upper body: Supervision, 5 visits, without AE  Mobility and Transfer Goals  Pt will perform functional transfers: Supervision, 5 visits, with rolling walker  Neuro Re-Ed Goals  Pt will perform dynamic sitting balance:  Goal met, Independent  Musculoskeletal Goals  Pt will demonstrate increase of strength: Goal met  Pt will perform fine motor coordination tasks: independent, 5 visits, to increase ability to complete ADLs, w/ home exercise program, feeding, grooming, fasteners  Executive Fucntion Goals  Pt will demonstrate memory/orientation: Goal met  Pt will demonstrate safety: independent, 5 visits, to increase ability to complete ADLs, instituting safety techniques  Pt will demonstrate attention to task: independent, 5 visits, to  increase ability to complete ADLs                Ander Purpura, MA/OTR, CSRS, LSVT-BIG  [phone]    Time of Treatment  OT Received On: 03/05/15  Start Time: 1345  Stop Time: 1430  Time Calculation (min): 45 min    Treatment # 2 of 5 visits

## 2015-03-05 NOTE — Plan of Care (Addendum)
Problem: Safety  Goal: Patient will be free from injury during hospitalization  Outcome: Progressing    Problem: Pain  Goal: Patient's pain/discomfort is manageable  Outcome: Progressing  Pt's pain well controlled with regimen. PRN Tramadol 50 mg given x 1 for c/o back pain with good effect.    Comments:   Remains A&O x 4. Verbalized relief post paracentesis.  Denies dizziness. Large loose BMs x 3, due to scheduled Lactulose.  Assisted with needs. Minimal dark urine output. Seizure precautions maintained.  CIWA remains under 8. POC maintained and pt will be monitored.    Potassium level 3.2. N/O for KCL x 2 Q 1 hr, starting 0730.

## 2015-03-06 LAB — BASIC METABOLIC PANEL
BUN: 21 mg/dL (ref 9.0–28.0)
CO2: 18 mEq/L — ABNORMAL LOW (ref 22–29)
Calcium: 8.1 mg/dL — ABNORMAL LOW (ref 8.5–10.5)
Chloride: 105 mEq/L (ref 100–111)
Creatinine: 0.8 mg/dL (ref 0.7–1.3)
Glucose: 123 mg/dL — ABNORMAL HIGH (ref 70–100)
Potassium: 3.9 mEq/L (ref 3.5–5.1)
Sodium: 131 mEq/L — ABNORMAL LOW (ref 136–145)

## 2015-03-06 LAB — HEPATIC FUNCTION PANEL
ALT: 27 U/L (ref 0–55)
AST (SGOT): 102 U/L — ABNORMAL HIGH (ref 5–34)
Albumin/Globulin Ratio: 0.4 — ABNORMAL LOW (ref 0.9–2.2)
Albumin: 1.9 g/dL — ABNORMAL LOW (ref 3.5–5.0)
Alkaline Phosphatase: 87 U/L (ref 38–106)
Bilirubin Direct: 16.3 mg/dL — ABNORMAL HIGH (ref 0.0–0.5)
Bilirubin Indirect: 9.1 mg/dL — ABNORMAL HIGH (ref 0.0–1.1)
Bilirubin, Total: 25.4 mg/dL — ABNORMAL HIGH (ref 0.2–1.2)
Globulin: 5 g/dL — ABNORMAL HIGH (ref 2.0–3.6)
Protein, Total: 6.9 g/dL (ref 6.0–8.3)

## 2015-03-06 LAB — CBC
Hematocrit: 23.7 % — ABNORMAL LOW (ref 42.0–52.0)
Hgb: 8.3 g/dL — ABNORMAL LOW (ref 13.0–17.0)
MCH: 34.2 pg — ABNORMAL HIGH (ref 28.0–32.0)
MCHC: 35 g/dL (ref 32.0–36.0)
MCV: 97.5 fL (ref 80.0–100.0)
MPV: 10.8 fL (ref 9.4–12.3)
Nucleated RBC: 0 /100 WBC (ref 0–1)
Platelets: 123 10*3/uL — ABNORMAL LOW (ref 140–400)
RBC: 2.43 10*6/uL — ABNORMAL LOW (ref 4.70–6.00)
RDW: 25 % — ABNORMAL HIGH (ref 12–15)
WBC: 10.27 10*3/uL (ref 3.50–10.80)

## 2015-03-06 LAB — PT AND APTT
PT INR: 2.5 — ABNORMAL HIGH (ref 0.9–1.1)
PT: 26.6 s — ABNORMAL HIGH (ref 12.6–15.0)
PTT: 49 s — ABNORMAL HIGH (ref 23–37)

## 2015-03-06 LAB — GFR: EGFR: >60

## 2015-03-06 NOTE — Progress Notes (Signed)
Attending Attestation:     I have seen and personally examined the patient.  I agree with the findings and exam as documented by Dr. Lynnea Ferrier.      Plan:    1.  Alcoholic hepatitis.  The patient was started on steroid treatment to  continue for 4 weeks.  We will continue to monitor patient's progress.    2.  Acute cirrhosis secondary to alcohol abuse.  Continue with supportive  care.  Assess post paracentesis.  We will continue to monitor with fluid  restriction and diuresis.  3.  Left thigh hematoma.  We will continue with symptomatic management.  H  and H are stable.  4.  Left lower extremity edema.  Sonogram shows no evidence of deep venous  thrombosis, likely related to left lower extremity hematoma.  We will  continue to monitor.  5.  Gastrointestinal and deep venous thrombosis prophylaxis as needed.        Disposition:     Today's date: 03/06/2015  Admit Date: 03/04/2015  2:26 PM  Anticipated medical stability for discharge:Red - not tomorrow - estimated month/date: 03-11-2015  Service status: Inpatient: risk of morbidity and mortality, risk of progressive disease and risk of readmission  Reason for ongoing hospitalization: hepatitis  Anticipated discharge needs: tbd     Lucilla Edin, MD

## 2015-03-06 NOTE — Progress Notes (Signed)
MEDICINE PROGRESS NOTE    Date Time: 03/06/2015 6:46 AM  Patient Name: Gregory [name]  Attending Physician: Lucilla Edin, MD  Team Contact:   Medicine Resident Team A  Spectra: [phone]  Night: [phone]      Assessment:     Active Hospital Problems    Diagnosis   . Hematoma of left lower extremity   . Lactic acidosis   . Anemia, unspecified type   . Liver failure   . Liver failure without hepatic coma, unspecified chronicity   . Alcohol withdrawal, with unspecified complication   . Tachycardia   . Altered mental status, unspecified altered mental status type   . Increased ammonia level       39 year-old-male with history of alcoholic cirrhosis, now likely alcoholic hepatitis, SBP, and L thigh hematoma.     Plan:   #LFT elevation and hepatic cirrhosis  Likely alcoholic hepatitis. Discriminant score 70. HepBsAg, HepC Ab neg. ANA, ASMA, AFP neg. Paracentesis 6/15 showing only 6 PMNs (had already been on antibiotics), and SAAG 1.6 consistent with portal hypertension.   -Started prednisolone 40 mg daily for 4 weeks  -Lactulose, titrate to 2-3 soft stools daily  -Rifaximin 550mg  BID  -aldactone and lasix daily     #Fevers   Resolved likely due to SBP   -monitor for further fever  -empirically treating SBP with ceftriaxone 2 gm daily, plan to complete 7 days of antibiotics through 6/18    #Anemia  Stable. Likely secondary to  Resolving L thigh hematoma due to recent fall noted on OSH CT.   - S/p multiple units PRBC  - Monitor CBC daily    #L thigh hematoma  Recent fall with left sided hip trauma and L thigh hematoma noted on OSH CT, left lower extremity swelling more than right with negative venous Doppler on 6/16.  - Low dose tylenol and tramadol for pain     #Active alcohol abuse  Has not been requiring benzos.  - Discontinue CIWA protocol  - PO multivitamin, folic acid, and thiamine  - CATS consult eventually     #Altered mental status  Improved.  Likely from dilaudid and ativan given outside hospital but can  rule out hepatic encephalopathy. Improving slowly.   - Lactulose and rifaximin as above. Continue CIWA and avoid sedating meds as much as possible (reduce tramadol dose).     #Sinus tachycardia   Likely multi-factorial, from pain, anxiety, EtOH withdrawal.   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 Transfuse as above.     #Distended gallbladder on outside CT scan  6/6 RUQ US showed mobile sludge seen within a distended gallbladder and edematous gallbladder wall is edematous (which may be partially due to the surrounding ascites), no gallstone.   - Per GI, exam not concerning, hold off on HIDA scan for now    #Global  DVT Prophalyxis: holding in setting of coagulopathy  Lines: peripheral IV  Fluids: None  Electrolytes: Monitor and replete as needed  Nutrition: Regular diet, 2gm sodium    Code: Full   Subjective   Chief Complaint  Altered mental status     Interval History/Subjective  Started on prednisolone yesterday.  Lower extremity Doppler negative for deep vein thrombosis.  Feeling better overall.  No complaints this morning aside from leg pain, abdominal fullness.     Objective   Physical Exam  Temp:  [96.6 F (35.9 C)-98.6 F (37 C)] 96.6 F (35.9 C)  Heart Rate:  [83-92] 83  Resp Rate:  [17-18] 18  BP: (116-141)/(68-79) 116/68 mmHg  General: awake, alert, oriented x 2; no acute distress.  HEENT: perrla, eomi, icteric sclera though improving, mucous membranes moist  Neck: supple, no LAD, no thyromegaly, no JVD, no carotid bruits  Cardiovascular: fast rate regular rhythm, no murmurs, rubs, or gallops  Lungs: clear to auscultation bilaterally, without wheezing, rhonchi, or rales  Abdomen: tense, distended, tympanic, mild generalized tenderness, shifting dullness   Extremities: large L lateral thigh hematoma with ecchymotic skin changes, LLE diffusely edematous and tender to palpation, BLE warm to touch  Neuro: cranial nerves grossly intact, strength 5/5  in upper and lower extremities, sensation intact  Skin: jaundice, no spider angiomata       No intake or output data in the 24 hours ending 03/06/15 0646  Telemetry: None  Glucose: 123       Medications  Medications were reviewed.  Significant for:  Current Facility-Administered Medications   Medication Dose Route Frequency Last Rate Last Dose   . 0.9%  NaCl infusion   Intravenous PRN       . 0.9%  NaCl infusion   Intravenous PRN       . acetaminophen (TYLENOL) tablet 325 mg  325 mg Oral Q4H PRN   325 mg at 03/05/15 2307   . cefTRIAXone (ROCEPHIN) 2 g in sodium chloride 0.9 % 100 mL IVPB mini-bag plus  2 g Intravenous Q24H SCH 200 mL/hr at 03/05/15 1001 2 g at 03/05/15 1001   . folic acid (FOLVITE) tablet 1 mg  1 mg Oral Daily   1 mg at 03/05/15 1007   . furosemide (LASIX) tablet 20 mg  20 mg Oral Daily   20 mg at 03/05/15 1007   . lactulose (CHRONULAC) 10 GM/15ML solution 30 g  30 g Oral 4 times per day   30 g at 03/06/15 0629   . metoclopramide (REGLAN) injection 10 mg  10 mg Intravenous Q6H PRN       . multivitamin (MULTIVITAMIN) tablet 1 tablet  1 tablet Oral Daily   1 tablet at 03/05/15 1008   . naloxone (NARCAN) injection 0.2 mg  0.2 mg Intravenous PRN       . pantoprazole (PROTONIX) injection 40 mg  40 mg Intravenous BID   40 mg at 03/05/15 1814   . prednisoLONE (ORAPRED) oral solution 40 mg  40 mg Oral Daily   40 mg at 03/05/15 1534   . rifaximin (XIFAXAN) tablet 550 mg  550 mg Oral BID   550 mg at 03/05/15 2141   . spironolactone (ALDACTONE) tablet 100 mg  100 mg Oral Daily   100 mg at 03/05/15 1007   . thiamine (B-1) tablet 100 mg  100 mg Oral Daily   100 mg at 03/05/15 1008   . traMADol (ULTRAM) tablet 50 mg  50 mg Oral Q8H PRN   50 mg at 03/06/15 0124         Labs    Recent Labs  Lab 03/06/15  0317 03/05/15  0451   SODIUM 131* 133*   POTASSIUM 3.9 3.2*   CHLORIDE 105 104   CO2 18* 21*   BUN 21.0 21.0   CREATININE 0.8 0.9   CALCIUM 8.1* 8.4*   ALBUMIN 1.9* 2.0*   PROTEIN, TOTAL 6.9 7.0   BILIRUBIN, TOTAL  25.4* 25.7*   ALKALINE PHOSPHATASE 87 85   ALT 27 26   AST (SGOT) 102* 117*   GLUCOSE  123* 85         Recent Labs  Lab 03/06/15  0317 03/05/15  0451   WBC 10.27 11.46*   HGB 8.3* 8.1*   HEMATOCRIT 23.7* 23.0*   PLATELETS 123* 111*         Recent Labs  Lab 03/06/15  0317 03/05/15  0451   PT 26.6* 24.6*   PT INR 2.5* 2.3*   PTT 49* 45*       Microbiology  Microbiology Results     Procedure Component Value Units Date/Time    CULTURE BLOOD AEROBIC AND ANAEROBIC [[id number]] Collected:  02/27/15 1709    Specimen Information:  Blood Updated:  03/05/15 0021    Narrative:      ORDER#: [id number]                                    ORDERED BY: Dewaine Conger  SOURCE: Blood blood                                  COLLECTED:  02/27/15 17:09  ANTIBIOTICS AT COLL.:                                RECEIVED :  02/27/15 21:53  Culture Blood Aerobic and Anaerobic        FINAL       03/05/15 00:21  03/05/15   No growth after 5 days of incubation.      CULTURE BLOOD AEROBIC AND ANAEROBIC [[id number]] Collected:  02/27/15 0906    Specimen Information:  Blood Updated:  03/04/15 1421    Narrative:      ORDER#: [id number]                                    ORDERED BY: Dewaine Conger  SOURCE: Blood blood                                  COLLECTED:  02/27/15 09:06  ANTIBIOTICS AT COLL.:                                RECEIVED :  02/27/15 11:42  Culture Blood Aerobic and Anaerobic        FINAL       03/04/15 14:21  03/04/15   No growth after 5 days of incubation.      Culture + Gram Zachary George Fluid [[id number]] Collected:  03/04/15 1529    Specimen Information:  Body Fluid from Peritoneal Fluid (Abdominal) Updated:  03/05/15 2021    Narrative:      ORDER#: [id number]                                    ORDERED BY: Nancie Neas  SOURCE: Peritoneal Fluid (Abdominal) peritoneal fluidCOLLECTED:  03/04/15 15:29  ANTIBIOTICS AT COLL.:                                RECEIVED :  03/04/15 18:53  Stain, Gram                                FINAL        03/04/15 21:32  03/04/15   No WBCs or organisms seen  Culture and Gram Stain, Aerobic, Body FluidPRELIM      03/05/15 20:23  03/05/15   Culture no growth to date. Final report to follow      [facility]SA culture [[id number]] Collected:  02/23/15 1413    Specimen Information:  Body Fluid from Nasal/Throat ASC Admission Updated:  02/24/15 1829    Narrative:      ORDER#: [id number]                                    ORDERED BY: Shawnie Pons  SOURCE: Nares and Throat                             COLLECTED:  02/23/15 14:13  ANTIBIOTICS AT COLL.:                                RECEIVED :  02/23/15 18:14  Culture MRSA Surveillance                  FINAL       02/24/15 18:29  02/24/15   Negative for Methicillin Resistant Staph aureus from Nares and             Negative for Methicillin Resistant Staph aureus from Throat      Urine culture [[id number]] Collected:  02/27/15 0853    Specimen Information:  Urine from Urine, Clean Catch Updated:  02/28/15 1343    Narrative:      ORDER#: [id number]                                    ORDERED BY: Dewaine Conger  SOURCE: Urine, Clean Catch                           COLLECTED:  02/27/15 08:53  ANTIBIOTICS AT COLL.:                                RECEIVED :  02/27/15 14:23  Culture Urine                              FINAL       02/28/15 13:43  02/28/15   No growth of >1,000 CFU/ML, No further work                Imaging  Ct Abdomen Pelvis Wo Iv/ Wo Po Cont    02/28/2015    1. Stable large intramuscular hematoma surrounding left hip and upper thigh involving left gluteus musculature and lateral abductors. 2. No retroperitoneal hemorrhage. Unchanged large ascites.  Charlott Rakes, MD  02/28/2015 9:41 PM     US Venous Duplex Doppler Leg Left    03/05/2015    Slightly limited exam without identification of peroneal veins. Otherwise there is  no evidence of deep venous thrombosis involving the left lower extremity.      Will Bonnet, MD  03/05/2015 5:34 PM         Case discussed with: Dr. Janece Canterbury, attending  physician    Signed by: Shaune Pollack PGY-1

## 2015-03-06 NOTE — Plan of Care (Signed)
Problem: Pain  Goal: Patient's pain/discomfort is manageable  Outcome: Progressing  Pt is A&O x4, complaining of abd pain & headache, tylenol and tramadol given with adequate result, had BM x3, lactulose was held, skin jaundice, distended abd, safety measures in place, floor mat at bedside, chair and bed alarm on when in use, demonstrated use of call bell. Will continue monitoring.

## 2015-03-06 NOTE — Progress Notes (Signed)
[facility]  PROGRESS NOTE  [facility] call: [phone], [phone]  [facility] call: [phone]  After hours call [phone]    Date Time: 03/06/2015 5:05 PM  Patient Name: Gregory [name], Gregory [name]      Chief Complaint:   Alcoholic hepatitis/cirrhosis    Assessment and Plan:   Assessment: 39 yo M with EtOH abuse presented to [facility] ED with abdominal pain, recent fall and found to have anemia and jaundice. CT with large left hip hematoma, ascites.     1. Anemia in setting of worsening large left hip hematoma, likely main source.No report of overt GI bleeding at this time.  2. Alcoholic hepatitis and underlying known cirrhosis.DF is 55. MELD 31 (6/17). LFTs stable. Hepatitis BsAg, Hep C Ab neg, Ceruloplasmin 15, iron/TIBC normal; ferritin 1988. AFP 7.1. ANA, mitochondrial Ab negative  3. Alcohol abuse, active until admission  4.  Fever 6/11.  Cultures negative thus far.  Cell count from paracentesis 6/15 not c/w SBP, but this is in setting of ABX    Plan:   1. Continue course of broad spectrum ABX for presumed SBP  2. PPI  3. Follow H&H and continue to monitor for overt GI bleeding. 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 Follow LFTs/INR  5. EtOH abstinence  6. Can decrease/hold Lactulose given pt experiencing diarrhea, however continue Xifaxan   7.Xifaxan 550mg  BID  8. Encourage nutrition   9. Aldactone 100mg  daily and Lasix 20mg  daily  10. Low sodium diet  11. Continue Prednisolone 40mg  x 4 weeks    We will follow peripherally over the weekend. Please call with any questions.      Subjective:   Pt feeling "ok" today. Afebrile overnight. Pt reporting multiple episodes of diarrhea      Medications:     Current Facility-Administered Medications   Medication Dose Route Frequency   . cefTRIAXone  2 g Intravenous Q24H SCH   . folic acid  1 mg Oral Daily   . furosemide  20 mg Oral Daily   . lactulose  30 g Oral 4 times per day   .  multivitamin  1 tablet Oral Daily   . pantoprazole  40 mg Intravenous BID   . prednisoLONE  40 mg Oral Daily   . rifaximin  550 mg Oral BID   . spironolactone  100 mg Oral Daily   . thiamine  100 mg Oral Daily       Review of Systems:   Complete 12 Point ROS negative or normal except those mentioned in HPI above.    Physical Exam:     Filed Vitals:    03/06/15 1559   BP: 123/77   Pulse: 93   Temp: 97.3 F (36.3 C)   Resp: 16   SpO2: 93%   BP 123/77 mmHg  Pulse 93  Temp(Src) 97.3 F (36.3 C) (Oral)  Resp 16  Ht 1.93 m (6\' 4" )  Wt 81.647 kg (180 lb)  BMI 21.92 kg/m2  SpO2 93%    General appearance: Chronically-ill appearing, appears stated age and in NAD, tremulous  Eyes: +scleral icterus, pink conjunctivae, no ptosis  ENMT: mucous membranes moist, nose and ears appear normal.  Oropharynx clear.  Chest: Non labored respirations, no audible wheezing, no clubbing or cyanosis  CV:  Tachycardic, + B/L ankle edema  Abdomen: softly distended, nontender, no masses or organomegaly  Skin: +Jaundice, normal turgor, no rashes, no suspicious skin  lesions noted  Neuro: CN II-XII grossly intact.  No gross movement disorders noted. + asterixis  Mental status: Appropriate affect, alert and oriented x 3    Labs:     Recent Labs      03/06/15   0317  03/05/15   0451   WBC  10.27  11.46*   HGB  8.3*  8.1*   HEMATOCRIT  23.7*  23.0*   PLATELETS  123*  111*   MCV  97.5  96.6       Recent Labs      03/06/15   0317  03/05/15   0451   SODIUM  131*  133*   POTASSIUM  3.9  3.2*   CHLORIDE  105  104   CO2  18*  21*   BUN  21.0  21.0   CREATININE  0.8  0.9   GLUCOSE  123*  85   CALCIUM  8.1*  8.4*       Recent Labs      03/06/15   0317  03/05/15   0451   AST (SGOT)  102*  117*   ALT  27  26   ALKALINE PHOSPHATASE  87  85   BILIRUBIN, TOTAL  25.4*  25.7*   BILIRUBIN, DIRECT  16.3*  16.7*   PROTEIN, TOTAL  6.9  7.0   ALBUMIN  1.9*  2.0*       Recent Labs      03/06/15   0317  03/05/15   0451   PTT  49*  45*   PT  26.6*  24.6*   PT INR   2.5*  2.3*        Radiology:   Radiological Procedure reviewed:  [place] RUQ 02/23/15:   1. The liver demonstrates a coarsened echotexture with a mildly nodular contour. There is no intrahepatic biliary ductal dilatation or mass.  2. A moderate amount of mobile sludge is identified within a distended gallbladder. There is no gallstone. The gallbladder wall is edematous and may be partially due to the surrounding ascites. If there is high clinical concern of acute cholecystitis a HIDA scan is recommended.  3. There is no hydronephrosis within the right kidney. There is a 1.0 cm benign appearing upper pole cyst.  4. Ascites. The remainder is as above.    CT A/P without contrast 02/28/15:  1. Stable large intramuscular hematoma surrounding left hip and upper thigh involving left gluteus musculature and lateral abductors.  2. No retroperitoneal hemorrhage. Unchanged large ascites.

## 2015-03-06 NOTE — Plan of Care (Signed)
Problem: Health Promotion  Goal: Knowledge - disease process  Extent of understanding conveyed about a specific disease process.   Outcome: Progressing  Patient axox3, breathing at room air without any distress. Patient's skin is jaundiced. On lactulose. Patient had 5 episodes of loose stool since this morning. Next dose of lactulose will be held.   Patient is continent and required moderate help with ADL's and pericare. Patient reported appetite improvement today. Care to be continued.     Problem: Safety  Goal: Patient will be free from injury during hospitalization  Outcome: Progressing  Fall, seizure, and safety precautions maintained at all time.     Problem: Pain  Goal: Patient's pain/discomfort is manageable  Outcome: Progressing  Patient abdominal pain is tolerable with help of PO/ PRN ultram.

## 2015-03-07 LAB — CBC
Hematocrit: 23.8 % — ABNORMAL LOW (ref 42.0–52.0)
Hgb: 8.3 g/dL — ABNORMAL LOW (ref 13.0–17.0)
MCH: 33.7 pg — ABNORMAL HIGH (ref 28.0–32.0)
MCHC: 34.9 g/dL (ref 32.0–36.0)
MCV: 96.7 fL (ref 80.0–100.0)
MPV: 10.6 fL (ref 9.4–12.3)
Nucleated RBC: 1 /100 WBC (ref 0–1)
Platelets: 150 10*3/uL (ref 140–400)
RBC: 2.46 10*6/uL — ABNORMAL LOW (ref 4.70–6.00)
RDW: 25 % — ABNORMAL HIGH (ref 12–15)
WBC: 14.49 10*3/uL — ABNORMAL HIGH (ref 3.50–10.80)

## 2015-03-07 LAB — BASIC METABOLIC PANEL
BUN: 22 mg/dL (ref 9.0–28.0)
CO2: 20 mEq/L — ABNORMAL LOW (ref 22–29)
Calcium: 8.4 mg/dL — ABNORMAL LOW (ref 8.5–10.5)
Chloride: 106 mEq/L (ref 100–111)
Creatinine: 0.8 mg/dL (ref 0.7–1.3)
Glucose: 106 mg/dL — ABNORMAL HIGH (ref 70–100)
Potassium: 3.6 mEq/L (ref 3.5–5.1)
Sodium: 133 mEq/L — ABNORMAL LOW (ref 136–145)

## 2015-03-07 LAB — GFR: EGFR: >60

## 2015-03-07 LAB — HEPATIC FUNCTION PANEL
ALT: 27 U/L (ref 0–55)
AST (SGOT): 90 U/L — ABNORMAL HIGH (ref 5–34)
Albumin/Globulin Ratio: 0.4 — ABNORMAL LOW (ref 0.9–2.2)
Albumin: 1.8 g/dL — ABNORMAL LOW (ref 3.5–5.0)
Alkaline Phosphatase: 84 U/L (ref 38–106)
Bilirubin Direct: 16 mg/dL — ABNORMAL HIGH (ref 0.0–0.5)
Bilirubin Indirect: 8.9 mg/dL — ABNORMAL HIGH (ref 0.0–1.1)
Bilirubin, Total: 24.9 mg/dL — ABNORMAL HIGH (ref 0.2–1.2)
Globulin: 4.9 g/dL — ABNORMAL HIGH (ref 2.0–3.6)
Protein, Total: 6.7 g/dL (ref 6.0–8.3)

## 2015-03-07 LAB — PT AND APTT
PT INR: 2.7 — ABNORMAL HIGH (ref 0.9–1.1)
PT: 28.5 s — ABNORMAL HIGH (ref 12.6–15.0)
PTT: 45 s — ABNORMAL HIGH (ref 23–37)

## 2015-03-07 LAB — LAB USE ONLY - HISTORICAL NON-GYN MEDICAL CYTOLOGY

## 2015-03-07 MED ORDER — HYDROMORPHONE HCL 1 MG/ML IJ SOLN
0.2000 mg | INTRAMUSCULAR | Status: DC | PRN
Start: 2015-03-07 — End: 2015-03-09
  Administered 2015-03-07 – 2015-03-09 (×10): 0.2 mg via INTRAVENOUS
  Filled 2015-03-07 (×10): qty 1

## 2015-03-07 NOTE — Plan of Care (Signed)
Problem: Health Promotion  Goal: Knowledge - disease process  Extent of understanding conveyed about a specific disease process.   Outcome: Progressing  Patient alert and oriented x 4. Denies pain or discomfort at this time. VSS. Skin jaundiced. On lactulose. BM x 2.  Abd distended. Hypoactive bowel sounds.  Refused SCDs on this shift.Hourly roundings continues, bed in lowest position, call light within reach. Will continue with plan of care.

## 2015-03-07 NOTE — Progress Notes (Signed)
MEDICINE PROGRESS NOTE    Date Time: 03/07/2015 12:28 PM  Patient Name: Gregory [name]  Attending Physician: Lucilla Edin, MD  Team Contact:   Medicine Resident Team A  Spectra: [phone]  Night: [phone]      Assessment:     Active Hospital Problems    Diagnosis   . Hematoma of left lower extremity   . Lactic acidosis   . Anemia, unspecified type   . Liver failure   . Liver failure without hepatic coma, unspecified chronicity   . Alcohol withdrawal, with unspecified complication   . Tachycardia   . Altered mental status, unspecified altered mental status type   . Increased ammonia level       39 year-old-male with history of alcoholic cirrhosis, now likely alcoholic hepatitis, SBP, and L thigh hematoma.     Plan:   #LFT elevation and hepatic cirrhosis  Likely alcoholic hepatitis. Discriminant score 70. HepBsAg, HepC Ab neg. ANA, ASMA, AFP neg. Paracentesis 6/15 showing only 6 PMNs (had already been on antibiotics), and SAAG 1.6 consistent with portal hypertension.   -Cont prednisolone 40 mg daily for 4 weeks  -Lactulose, titrate to 2-3 soft stools daily  -Rifaximin 550mg  BID  -aldactone and lasix daily     #Fevers   Resolved likely due to SBP   -monitor for further fever  -empirically treating SBP with ceftriaxone 2 gm daily, course completed today    #Anemia  Stable. Likely secondary to  Resolving L thigh hematoma due to recent fall noted on OSH CT.   - S/p multiple units PRBC  - Monitor CBC daily    #L thigh hematoma  Recent fall with left sided hip trauma and L thigh hematoma noted on OSH CT, left lower extremity swelling more than right with negative venous Doppler on 6/16.  - Low dose tylenol and tramadol for pain     #Active alcohol abuse  Has not been requiring benzos.  - Discontinue CIWA protocol  - PO multivitamin, folic acid, and thiamine  - CATS consult eventually     #Altered mental status  Improved.  Likely from dilaudid and ativan given outside hospital but can rule out hepatic encephalopathy.  Improving slowly.   - Lactulose and rifaximin as above. Continue CIWA and avoid sedating meds as much as possible (reduce tramadol dose).     #Sinus tachycardia   Likely multi-factorial, from pain, anxiety, EtOH withdrawal.   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 Transfuse as above.     #Distended gallbladder on outside CT scan  6/6 RUQ US showed mobile sludge seen within a distended gallbladder and edematous gallbladder wall is edematous (which may be partially due to the surrounding ascites), no gallstone.   - Per GI, exam not concerning, hold off on HIDA scan for now    #Global  DVT Prophalyxis: holding in setting of coagulopathy  Lines: peripheral IV  Fluids: None  Electrolytes: Monitor and replete as needed  Nutrition: Regular diet, 2gm sodium    Code: Full   Subjective   Chief Complaint  Altered mental status     Interval History/Subjective  Started on prednisolone recently.  Lower extremity Doppler negative for deep vein thrombosis.  Feeling better overall.  No complaints this morning aside from leg pain, abdominal fullness.     Objective   Physical Exam  Temp:  [97.2 F (36.2 C)-97.4 F (36.3 C)] 97.2 F (36.2 C)  Heart Rate:  [80-93] 90  Resp Rate:  [16-19] 18  BP: (116-123)/(67-77) 120/72 mmHg  General: awake, alert, oriented x 2; no acute distress.  HEENT: perrla, eomi, icteric sclera though improving, mucous membranes moist  Neck: supple, no LAD, no thyromegaly, no JVD, no carotid bruits  Cardiovascular: fast rate regular rhythm, no murmurs, rubs, or gallops  Lungs: clear to auscultation bilaterally, without wheezing, rhonchi, or rales  Abdomen: tense, distended, tympanic, mild generalized tenderness, shifting dullness   Extremities: large L lateral thigh hematoma with ecchymotic skin changes, LLE diffusely edematous and tender to palpation, BLE warm to touch  Neuro: cranial nerves grossly intact, strength 5/5 in upper and lower extremities,  sensation intact  Skin: jaundice, no spider angiomata         Intake/Output Summary (Last 24 hours) at 03/07/15 1228  Last data filed at 03/07/15 0500   Gross per 24 hour   Intake   1922 ml   Output    450 ml   Net   1472 ml     Telemetry: None  Glucose: 123       Medications  Medications were reviewed.  Significant for:  Current Facility-Administered Medications   Medication Dose Route Frequency Last Rate Last Dose   . 0.9%  NaCl infusion   Intravenous PRN       . 0.9%  NaCl infusion   Intravenous PRN       . acetaminophen (TYLENOL) tablet 325 mg  325 mg Oral Q4H PRN   325 mg at 03/05/15 2307   . folic acid (FOLVITE) tablet 1 mg  1 mg Oral Daily   1 mg at 03/07/15 0944   . furosemide (LASIX) tablet 20 mg  20 mg Oral Daily   20 mg at 03/07/15 0944   . lactulose (CHRONULAC) 10 GM/15ML solution 30 g  30 g Oral 4 times per day   30 g at 03/07/15 0523   . metoclopramide (REGLAN) injection 10 mg  10 mg Intravenous Q6H PRN       . multivitamin (MULTIVITAMIN) tablet 1 tablet  1 tablet Oral Daily   1 tablet at 03/07/15 0944   . naloxone [facility]) injection 0.2 mg  0.2 mg Intravenous PRN       . pantoprazole (PROTONIX) injection 40 mg  40 mg Intravenous BID   40 mg at 03/07/15 0943   . prednisoLONE (ORAPRED) oral solution 40 mg  40 mg Oral Daily   40 mg at 03/07/15 0944   . rifaximin (XIFAXAN) tablet 550 mg  550 mg Oral BID   550 mg at 03/07/15 0944   . spironolactone (ALDACTONE) tablet 100 mg  100 mg Oral Daily   100 mg at 03/07/15 0943   . thiamine (B-1) tablet 100 mg  100 mg Oral Daily   100 mg at 03/07/15 0944   . traMADol (ULTRAM) tablet 50 mg  50 mg Oral Q8H PRN   50 mg at 03/07/15 0951         Labs    Recent Labs  Lab 03/07/15  0426 03/06/15  0317   SODIUM 133* 131*   POTASSIUM 3.6 3.9   CHLORIDE 106 105   CO2 20* 18*   BUN 22.0 21.0   CREATININE 0.8 0.8   CALCIUM 8.4* 8.1*   ALBUMIN 1.8* 1.9*   PROTEIN, TOTAL 6.7 6.9   BILIRUBIN, TOTAL 24.9* 25.4*   ALKALINE PHOSPHATASE 84 87   ALT 27 27   AST (SGOT) 90* 102*    GLUCOSE 106* 123*  Recent Labs  Lab 03/07/15  0426 03/06/15  0317   WBC 14.49* 10.27   HGB 8.3* 8.3*   HEMATOCRIT 23.8* 23.7*   PLATELETS 150 123*         Recent Labs  Lab 03/07/15  0426 03/06/15  0317   PT 28.5* 26.6*   PT INR 2.7* 2.5*   PTT 45* 49*       Microbiology  Microbiology Results     Procedure Component Value Units Date/Time    CULTURE BLOOD AEROBIC AND ANAEROBIC [[id number]] Collected:  02/27/15 1709    Specimen Information:  Blood Updated:  03/05/15 0021    Narrative:      ORDER#: [id number]                                    ORDERED BY: Dewaine Conger  SOURCE: Blood blood                                  COLLECTED:  02/27/15 17:09  ANTIBIOTICS AT COLL.:                                RECEIVED :  02/27/15 21:53  Culture Blood Aerobic and Anaerobic        FINAL       03/05/15 00:21  03/05/15   No growth after 5 days of incubation.      CULTURE BLOOD AEROBIC AND ANAEROBIC [[id number]] Collected:  02/27/15 0906    Specimen Information:  Blood Updated:  03/04/15 1421    Narrative:      ORDER#: [id number]                                    ORDERED BY: Dewaine Conger  SOURCE: Blood blood                                  COLLECTED:  02/27/15 09:06  ANTIBIOTICS AT COLL.:                                RECEIVED :  02/27/15 11:42  Culture Blood Aerobic and Anaerobic        FINAL       03/04/15 14:21  03/04/15   No growth after 5 days of incubation.      Culture + Gram Stain,Aerobic, Body Fluid [[id number]] Collected:  03/04/15 1529    Specimen Information:  Body Fluid from Peritoneal Fluid (Abdominal) Updated:  03/05/15 2021    Narrative:      ORDER#: [id number]                                    ORDERED BY: Nancie Neas  SOURCE: Peritoneal Fluid (Abdominal) peritoneal fluidCOLLECTED:  03/04/15 15:29  ANTIBIOTICS AT COLL.:                                RECEIVED :  03/04/15 18:53  Stain, Gram  FINAL       03/04/15 21:32  03/04/15   No WBCs or organisms seen  Culture and Gram  Stain, Aerobic, Body FluidPRELIM      03/05/15 20:23  03/05/15   Culture no growth to date. Final report to follow      MRSA culture [[id number]] Collected:  02/23/15 1413    Specimen Information:  Body Fluid from Nasal/Throat ASC Admission Updated:  02/24/15 1829    Narrative:      ORDER#: [id number]                                    ORDERED BY: Shawnie Pons  SOURCE: Nares and Throat                             COLLECTED:  02/23/15 14:13  ANTIBIOTICS AT COLL.:                                RECEIVED :  02/23/15 18:14  Culture MRSA Surveillance                  FINAL       02/24/15 18:29  02/24/15   Negative for Methicillin Resistant Staph aureus from Nares and             Negative for Methicillin Resistant Staph aureus from Throat      Urine culture [[id number]] Collected:  02/27/15 0853    Specimen Information:  Urine from Urine, Clean Catch Updated:  02/28/15 1343    Narrative:      ORDER#: [id number]                                    ORDERED BY: Dewaine Conger  SOURCE: Urine, Clean Catch                           COLLECTED:  02/27/15 08:53  ANTIBIOTICS AT COLL.:                                RECEIVED :  02/27/15 14:23  Culture Urine                              FINAL       02/28/15 13:43  02/28/15   No growth of >1,000 CFU/ML, No further work                Imaging  Ct Abdomen Pelvis Wo Iv/ Wo Po Cont    02/28/2015    1. Stable large intramuscular hematoma surrounding left hip and upper thigh involving left gluteus musculature and lateral abductors. 2. No retroperitoneal hemorrhage. Unchanged large ascites.  Charlott Rakes, MD  02/28/2015 9:41 PM     US Venous Duplex Doppler Leg Left    03/05/2015    Slightly limited exam without identification of peroneal veins. Otherwise there is no evidence of deep venous thrombosis involving the left lower extremity.      Will Bonnet, MD  03/05/2015 5:34 PM         Case discussed with: Dr.  Janece Canterbury, attending physician

## 2015-03-07 NOTE — Progress Notes (Signed)
Attending Attestation:     I have seen and personally examined the patient.  I agree with the findings and exam as documented by Dr. Francisco Capuchin.    Plan:  1.  Alcoholic hepatitis.  Continue with the current treatment regimen as  per GI team recommendation.  2.  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  We will titrate  lactulose to 2 to 3 bowel movements.  3.  Left thigh hematoma with left lower extremity edema.  Sonogram showed  no evidence of clot.  Continue with supportive care.  4.  Gastrointestinal and deep venous thrombosis prophylaxis as needed.   Physical therapy, occupational therapy evaluation.  The patient is a high  risk for fall due to deconditioning and left thigh hematoma.          Disposition:     Today's date: 03/07/2015  Admit Date: 03/04/2015  2:36 PM  Anticipated medical stability for discharge:Red - not tomorrow - estimated month/date: 02-2015  Service status: Inpatient: risk of morbidity and mortality, risk of progressive disease and risk of readmission  Reason for ongoing hospitalization: hepatitis  Anticipated discharge needs: tbd     Lucilla Edin, MD

## 2015-03-07 NOTE — Plan of Care (Signed)
Problem: Pain  Goal: Patient's pain/discomfort is manageable  Outcome: Progressing  Pt. Is alert and oriented x3, is forgetful at times. Complains of pain in his left leg, managed by PRN Tramadol and dilaudid. Pt's sclera and skin looks yellowish. Pt. had 2 loose BM during the shift. He moved to the chair with 1 person assist and walker. VSS. Midline on RUE is intact with good blood return. Refused Scds due to pain in his leg. Call bell within reach, safety maintained and continued hourly rounding.

## 2015-03-08 LAB — CBC
Hematocrit: 22.3 % — ABNORMAL LOW (ref 42.0–52.0)
Hgb: 7.8 g/dL — ABNORMAL LOW (ref 13.0–17.0)
MCH: 34.7 pg — ABNORMAL HIGH (ref 28.0–32.0)
MCHC: 35 g/dL (ref 32.0–36.0)
MCV: 99.1 fL (ref 80.0–100.0)
MPV: 11.3 fL (ref 9.4–12.3)
Nucleated RBC: 1 /100 WBC (ref 0–1)
Platelets: 170 10*3/uL (ref 140–400)
RBC: 2.25 10*6/uL — ABNORMAL LOW (ref 4.70–6.00)
RDW: 26 % — ABNORMAL HIGH (ref 12–15)
WBC: 17.34 10*3/uL — ABNORMAL HIGH (ref 3.50–10.80)

## 2015-03-08 LAB — PT AND APTT
PT INR: 2.8 — ABNORMAL HIGH (ref 0.9–1.1)
PT: 28.7 s — ABNORMAL HIGH (ref 12.6–15.0)
PTT: 44 s — ABNORMAL HIGH (ref 23–37)

## 2015-03-08 LAB — HEPATIC FUNCTION PANEL
ALT: 36 U/L (ref 0–55)
AST (SGOT): 106 U/L — ABNORMAL HIGH (ref 5–34)
Albumin/Globulin Ratio: 0.4 — ABNORMAL LOW (ref 0.9–2.2)
Albumin: 1.9 g/dL — ABNORMAL LOW (ref 3.5–5.0)
Alkaline Phosphatase: 83 U/L (ref 38–106)
Bilirubin Direct: 16.7 mg/dL — ABNORMAL HIGH (ref 0.0–0.5)
Bilirubin Indirect: 8.7 mg/dL — ABNORMAL HIGH (ref 0.0–1.1)
Bilirubin, Total: 25.4 mg/dL — ABNORMAL HIGH (ref 0.2–1.2)
Globulin: 5 g/dL — ABNORMAL HIGH (ref 2.0–3.6)
Protein, Total: 6.9 g/dL (ref 6.0–8.3)

## 2015-03-08 LAB — BASIC METABOLIC PANEL
BUN: 21 mg/dL (ref 9.0–28.0)
CO2: 20 mEq/L — ABNORMAL LOW (ref 22–29)
Calcium: 8.4 mg/dL — ABNORMAL LOW (ref 8.5–10.5)
Chloride: 105 mEq/L (ref 100–111)
Creatinine: 0.8 mg/dL (ref 0.7–1.3)
Glucose: 90 mg/dL (ref 70–100)
Potassium: 3.5 mEq/L (ref 3.5–5.1)
Sodium: 134 mEq/L — ABNORMAL LOW (ref 136–145)

## 2015-03-08 LAB — GFR: EGFR: >60

## 2015-03-08 MED ORDER — POTASSIUM CHLORIDE CRYS ER 20 MEQ PO TBCR
40.0000 meq | EXTENDED_RELEASE_TABLET | Freq: Once | ORAL | Status: AC
Start: 2015-03-08 — End: 2015-03-08
  Administered 2015-03-08: 40 meq via ORAL
  Filled 2015-03-08: qty 2

## 2015-03-08 NOTE — Progress Notes (Signed)
Attending Attestation:     I have seen and personally examined the patient.  I agree with the findings and exam as documented by Dr. Lynnea Ferrier    Plan:    1.  Acute alcoholic hepatitis:  Continue with current treatment as per the  GI team.  The patient is improving significantly.  2.  Hepatic encephalopathy:  Has resolved.  Continue with lactulose  titrating to 2 to 3 bowel movements per day.  3.  Left thigh hematoma:  Continue with supportive care.    4.  Left lower-extremity edema:  Sonogram was negative for any clot.   Recommendation is to consider compression stocking versus left  lower-extremity elevation above the heart.  5.  Gastrointestinal and deep venous thrombosis prophylaxes as needed.   Physical therapy recommends a skilled facility.  Can be discharged to a  skilled facility when bed available and patient is cleared by  gastroenterology team.     DISPOSITION PLAN:  To a skilled facility in the a.m.        Disposition:     Today's date: 03/08/2015  Admit Date: 03/04/2015  2:36 PM  Anticipated medical stability for discharge:Yellow - maybe tomorrow  Service status: Inpatient: risk of morbidity and mortality, risk of progressive disease and risk of readmission  Reason for ongoing hospitalization: alcoholic hepatitis  Anticipated discharge needs: tbd     Lucilla Edin, MD

## 2015-03-08 NOTE — Progress Notes (Signed)
Pt. Refused Bed Alarm.

## 2015-03-08 NOTE — Plan of Care (Signed)
Problem: Pain  Goal: Patient's pain/discomfort is manageable  Outcome: Progressing  Pt A/Ox4, denies N/V, SOB, CP. C/o pain in his LLE, head, and abdomen. IV dilaudid given x2 and ultram given x1 with moderate relief of pain. LLE is bruised, tender to touch, edematous +2. Continuous lactulose. Pt is having multiple loose stools throughout the night and refused his morning dose d/t diarrhea. Diminished urine production. Urine dark, tea colored. VSS.

## 2015-03-08 NOTE — Progress Notes (Signed)
MEDICINE PROGRESS NOTE    Date Time: 03/08/2015 6:42 AM  Patient Name: Gregory [name]  Attending Physician: Lucilla Edin, MD  Team Contact:   Medicine Resident Team A  Spectra: [phone]  Night: [phone]      Assessment:     Active Hospital Problems    Diagnosis   . Hematoma of left lower extremity   . Lactic acidosis   . Anemia, unspecified type   . Liver failure   . Liver failure without hepatic coma, unspecified chronicity   . Alcohol withdrawal, with unspecified complication   . Tachycardia   . Altered mental status, unspecified altered mental status type   . Increased ammonia level       39 year-old-male with history of alcoholic cirrhosis, now likely alcoholic hepatitis, SBP, and L thigh hematoma.     Plan:   #LFT elevation and hepatic cirrhosis  Likely alcoholic hepatitis. Discriminant score 70. HepBsAg, HepC Ab neg. ANA, ASMA, AFP neg. Paracentesis 6/15 showing only 6 PMNs (had already been on antibiotics), and SAAG 1.6 consistent with portal hypertension.   -Cont prednisolone 40 mg daily for 4 weeks  -Lactulose, titrate to 2-3 soft stools daily  -Rifaximin 550mg  BID  -aldactone and lasix daily     #Fevers   Resolved likely due to SBP   -monitor for further fever  -empirically treating SBP with ceftriaxone 2 gm daily, course completed today    #Anemia  Stable. Likely secondary to  Resolving L thigh hematoma due to recent fall noted on OSH CT. Normocytic but very high RDW, also possible macrocytosis/microcytosis.  -S/p multiple units PRBC  -Monitor CBC daily  -check reticulocytes, haptoglobin, folate, B12, iron    #L thigh hematoma  Recent fall with left sided hip trauma and L thigh hematoma noted on OSH CT, left lower extremity swelling more than right with negative venous Doppler on 6/16.  - Low dose tylenol and tramadol for pain     #Active alcohol abuse  Has not been requiring benzos.  - Discontinue CIWA protocol  - PO multivitamin, folic acid, and thiamine  - CATS consult eventually     #Altered  mental status  Improved.  Likely from dilaudid and ativan given outside hospital but can rule out hepatic encephalopathy. Improving slowly.   - Lactulose and rifaximin as above. Off CIWA, avoid sedating meds as much as possible (reduce tramadol dose).     #Sinus tachycardia   Likely multi-factorial, from pain, anxiety, EtOH withdrawal.   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 Transfuse as above.     #Distended gallbladder on outside CT scan  6/6 RUQ US showed mobile sludge seen within a distended gallbladder and edematous gallbladder wall is edematous (which may be partially due to the surrounding ascites), no gallstone.   - Per GI, exam not concerning, hold off on HIDA scan for now    #Global  DVT Prophalyxis: holding in setting of coagulopathy  Lines: peripheral IV  Fluids: None  Electrolytes: Monitor and replete as needed  Nutrition: Regular diet, 2gm sodium    Code: Full   Subjective   Chief Complaint  Altered mental status     Interval History/Subjective  Continuing on prednisolone.  Feeling fine except left leg and foot starting hurting worse yesterday.  No complaints this morning aside from leg pain, abdominal fullness.     Objective   Physical Exam  Temp:  [97 F (36.1 C)-97.8 F (36.6 C)] 97 F (  36.1 C)  Heart Rate:  [81-94] 81  Resp Rate:  [14-18] 18  BP: (118-139)/(70-84) 135/84 mmHg  General: awake, alert, oriented x 3; no acute distress.  HEENT: perrla, eomi, icteric sclera though improving, mucous membranes moist  Neck: supple, no LAD, no thyromegaly, no JVD, no carotid bruits  Cardiovascular: fast rate regular rhythm, no murmurs, rubs, or gallops  Lungs: clear to auscultation bilaterally, without wheezing, rhonchi, or rales  Abdomen: tense, distended, tympanic, mild generalized tenderness, shifting dullness   Extremities: large L lateral thigh hematoma with ecchymotic skin changes, LLE diffusely edematous and tender to palpation, BLE warm  to touch  Neuro: cranial nerves grossly intact, strength 5/5 in upper and lower extremities, sensation intact  Skin: jaundice, no spider angiomata         Intake/Output Summary (Last 24 hours) at 03/08/15 0642  Last data filed at 03/08/15 0550   Gross per 24 hour   Intake      0 ml   Output   1350 ml   Net  -1350 ml     Telemetry: None  Glucose: 90       Medications  Medications were reviewed.  Significant for:  Current Facility-Administered Medications   Medication Dose Route Frequency Last Rate Last Dose   . 0.9%  NaCl infusion   Intravenous PRN       . 0.9%  NaCl infusion   Intravenous PRN       . acetaminophen (TYLENOL) tablet 325 mg  325 mg Oral Q4H PRN   325 mg at 03/05/15 2307   . folic acid (FOLVITE) tablet 1 mg  1 mg Oral Daily   1 mg at 03/07/15 0944   . furosemide (LASIX) tablet 20 mg  20 mg Oral Daily   20 mg at 03/07/15 0944   . HYDROmorphone (DILAUDID) injection 0.2 mg  0.2 mg Intravenous Q2H PRN   0.2 mg at 03/08/15 0054   . lactulose (CHRONULAC) 10 GM/15ML solution 30 g  30 g Oral 4 times per day   30 g at 03/07/15 2315   . metoclopramide (REGLAN) injection 10 mg  10 mg Intravenous Q6H PRN       . multivitamin (MULTIVITAMIN) tablet 1 tablet  1 tablet Oral Daily   1 tablet at 03/07/15 0944   . naloxone [facility]) injection 0.2 mg  0.2 mg Intravenous PRN       . pantoprazole (PROTONIX) injection 40 mg  40 mg Intravenous BID   40 mg at 03/07/15 1834   . prednisoLONE (ORAPRED) oral solution 40 mg  40 mg Oral Daily   40 mg at 03/07/15 0944   . rifaximin (XIFAXAN) tablet 550 mg  550 mg Oral BID   550 mg at 03/07/15 2315   . spironolactone (ALDACTONE) tablet 100 mg  100 mg Oral Daily   100 mg at 03/07/15 0943   . thiamine (B-1) tablet 100 mg  100 mg Oral Daily   100 mg at 03/07/15 0944   . traMADol (ULTRAM) tablet 50 mg  50 mg Oral Q8H PRN   50 mg at 03/08/15 0545         Labs    Recent Labs  Lab 03/07/15  0426 03/06/15  0317   SODIUM 133* 131*   POTASSIUM 3.6 3.9   CHLORIDE 106 105   CO2 20* 18*   BUN 22.0  21.0   CREATININE 0.8 0.8   CALCIUM 8.4* 8.1*   ALBUMIN 1.8* 1.9*   PROTEIN, TOTAL  6.7 6.9   BILIRUBIN, TOTAL 24.9* 25.4*   ALKALINE PHOSPHATASE 84 87   ALT 27 27   AST (SGOT) 90* 102*   GLUCOSE 106* 123*         Recent Labs  Lab 03/07/15  0426 03/06/15  0317   WBC 14.49* 10.27   HGB 8.3* 8.3*   HEMATOCRIT 23.8* 23.7*   PLATELETS 150 123*         Recent Labs  Lab 03/07/15  0426 03/06/15  0317   PT 28.5* 26.6*   PT INR 2.7* 2.5*   PTT 45* 49*       Microbiology  Microbiology Results     Procedure Component Value Units Date/Time    CULTURE BLOOD AEROBIC AND ANAEROBIC [[id number]] Collected:  02/27/15 1709    Specimen Information:  Blood Updated:  03/05/15 0021    Narrative:      ORDER#: [id number]                                    ORDERED BY: Dewaine Conger  SOURCE: Blood blood                                  COLLECTED:  02/27/15 17:09  ANTIBIOTICS AT COLL.:                                RECEIVED :  02/27/15 21:53  Culture Blood Aerobic and Anaerobic        FINAL       03/05/15 00:21  03/05/15   No growth after 5 days of incubation.      CULTURE BLOOD AEROBIC AND ANAEROBIC [[id number]] Collected:  02/27/15 0906    Specimen Information:  Blood Updated:  03/04/15 1421    Narrative:      ORDER#: [id number]                                    ORDERED BY: Dewaine Conger  SOURCE: Blood blood                                  COLLECTED:  02/27/15 09:06  ANTIBIOTICS AT COLL.:                                RECEIVED :  02/27/15 11:42  Culture Blood Aerobic and Anaerobic        FINAL       03/04/15 14:21  03/04/15   No growth after 5 days of incubation.      Culture + Gram Zachary George Fluid [[id number]] Collected:  03/04/15 1529    Specimen Information:  Body Fluid from Peritoneal Fluid (Abdominal) Updated:  03/05/15 2021    Narrative:      ORDER#: [id number]                                    ORDERED BY: Nancie Neas  SOURCE: Peritoneal Fluid (Abdominal) peritoneal fluidCOLLECTED:  03/04/15 15:29  ANTIBIOTICS AT COLL.:  RECEIVED :  03/04/15 18:53  Stain, Gram                                FINAL       03/04/15 21:32  03/04/15   No WBCs or organisms seen  Culture and Gram Stain, Aerobic, Body FluidPRELIM      03/05/15 20:23  03/05/15   Culture no growth to date. Final report to follow      MRSA culture [[id number]] Collected:  02/23/15 1413    Specimen Information:  Body Fluid from Nasal/Throat ASC Admission Updated:  02/24/15 1829    Narrative:      ORDER#: [id number]                                    ORDERED BY: Shawnie Pons  SOURCE: Nares and Throat                             COLLECTED:  02/23/15 14:13  ANTIBIOTICS AT COLL.:                                RECEIVED :  02/23/15 18:14  Culture MRSA Surveillance                  FINAL       02/24/15 18:29  02/24/15   Negative for Methicillin Resistant Staph aureus from Nares and             Negative for Methicillin Resistant Staph aureus from Throat      Urine culture [[id number]] Collected:  02/27/15 0853    Specimen Information:  Urine from Urine, Clean Catch Updated:  02/28/15 1343    Narrative:      ORDER#: [id number]                                    ORDERED BY: Dewaine Conger  SOURCE: Urine, Clean Catch                           COLLECTED:  02/27/15 08:53  ANTIBIOTICS AT COLL.:                                RECEIVED :  02/27/15 14:23  Culture Urine                              FINAL       02/28/15 13:43  02/28/15   No growth of >1,000 CFU/ML, No further work                Imaging  US Venous Duplex Doppler Leg Left    03/05/2015    Slightly limited exam without identification of peroneal veins. Otherwise there is no evidence of deep venous thrombosis involving the left lower extremity.      Will Bonnet, MD  03/05/2015 5:34 PM         Case discussed with: Dr. Janece Canterbury, attending physician

## 2015-03-08 NOTE — Plan of Care (Addendum)
Problem: Pain  Goal: Patient's pain/discomfort is manageable  Outcome: Progressing      Pt A&O x4. Vital signs stable. Abdomen and left leg pain controlled with prn dilaudid. LLE elevated with pillow. Pt had 2 losse BM today. Floor mats in place, bed in lowest position and alarm activated, call light within reach, will continue to monitor.

## 2015-03-09 ENCOUNTER — Other Ambulatory Visit: Payer: Self-pay

## 2015-03-09 DIAGNOSIS — F1099 Alcohol use, unspecified with unspecified alcohol-induced disorder: Secondary | ICD-10-CM

## 2015-03-09 LAB — CBC
Hematocrit: 26.6 % — ABNORMAL LOW (ref 42.0–52.0)
Hgb: 9.4 g/dL — ABNORMAL LOW (ref 13.0–17.0)
MCH: 34.9 pg — ABNORMAL HIGH (ref 28.0–32.0)
MCHC: 35.3 g/dL (ref 32.0–36.0)
MCV: 98.9 fL (ref 80.0–100.0)
MPV: 10.7 fL (ref 9.4–12.3)
Nucleated RBC: 0 /100 WBC (ref 0–1)
Platelets: 177 10*3/uL (ref 140–400)
RBC: 2.69 10*6/uL — ABNORMAL LOW (ref 4.70–6.00)
RDW: 27 % — ABNORMAL HIGH (ref 12–15)
WBC: 17.07 10*3/uL — ABNORMAL HIGH (ref 3.50–10.80)

## 2015-03-09 LAB — HEPATIC FUNCTION PANEL
ALT: 44 U/L (ref 0–55)
AST (SGOT): 115 U/L — ABNORMAL HIGH (ref 5–34)
Albumin/Globulin Ratio: 0.4 — ABNORMAL LOW (ref 0.9–2.2)
Albumin: 2 g/dL — ABNORMAL LOW (ref 3.5–5.0)
Alkaline Phosphatase: 97 U/L (ref 38–106)
Bilirubin Direct: 18 mg/dL — ABNORMAL HIGH (ref 0.0–0.5)
Bilirubin Indirect: 8.6 mg/dL — ABNORMAL HIGH (ref 0.0–1.1)
Bilirubin, Total: 26.6 mg/dL — ABNORMAL HIGH (ref 0.2–1.2)
Globulin: 5.3 g/dL — ABNORMAL HIGH (ref 2.0–3.6)
Protein, Total: 7.3 g/dL (ref 6.0–8.3)

## 2015-03-09 LAB — BASIC METABOLIC PANEL
BUN: 21 mg/dL (ref 9.0–28.0)
CO2: 22 mEq/L (ref 22–29)
Calcium: 8.7 mg/dL (ref 8.5–10.5)
Chloride: 104 mEq/L (ref 100–111)
Creatinine: 0.8 mg/dL (ref 0.7–1.3)
Glucose: 91 mg/dL (ref 70–100)
Potassium: 3.6 mEq/L (ref 3.5–5.1)
Sodium: 135 mEq/L — ABNORMAL LOW (ref 136–145)

## 2015-03-09 LAB — PT AND APTT
PT INR: 2.6 — ABNORMAL HIGH (ref 0.9–1.1)
PT: 27 s — ABNORMAL HIGH (ref 12.6–15.0)
PTT: 45 s — ABNORMAL HIGH (ref 23–37)

## 2015-03-09 LAB — IRON PROFILE
Iron: 120 ug/dL (ref 40–160)
UIBC: <17 ug/dL — ABNORMAL LOW (ref 126–382)

## 2015-03-09 LAB — RETICULOCYTES
Immature Platelet Fraction: 3.4 % (ref 0.9–11.2)
Immature Retic Fract: 25.4 % — ABNORMAL HIGH (ref 2.3–13.4)
Reticulocyte Count Absolute: 0.124 10*6/uL (ref 0.0235–0.1500)
Reticulocyte Count Automated: 4.6 % — ABNORMAL HIGH (ref 0.5–2.5)
Reticulocyte Hemoglobin: 45.4 pg — ABNORMAL HIGH (ref 28.2–36.6)

## 2015-03-09 LAB — FERRITIN: Ferritin: 3498.21 ng/mL — ABNORMAL HIGH (ref 21.80–274.70)

## 2015-03-09 LAB — HEMOLYSIS INDEX: Hemolysis Index: 0 (ref 0–18)

## 2015-03-09 LAB — HAPTOGLOBIN: Haptoglobin: <8 mg/dL — ABNORMAL LOW (ref 14–258)

## 2015-03-09 LAB — GFR: EGFR: >60

## 2015-03-09 MED ORDER — LACTULOSE 10 GM/15ML PO SOLN
30.0000 g | Freq: Four times a day (QID) | ORAL | 0 refills | Status: DC
Start: 2015-03-09 — End: 2015-03-09
  Filled 2015-03-09: fill #0

## 2015-03-09 MED ORDER — FUROSEMIDE 40 MG PO TABS
40.0000 mg | ORAL_TABLET | Freq: Every day | ORAL | Status: DC
Start: 2015-03-09 — End: 2015-03-10
  Administered 2015-03-09 – 2015-03-10 (×2): 40 mg via ORAL
  Filled 2015-03-09 (×2): qty 1

## 2015-03-09 MED ORDER — HYDROMORPHONE HCL 1 MG/ML IJ SOLN
0.1000 mg | INTRAMUSCULAR | Status: DC | PRN
Start: 2015-03-09 — End: 2015-03-09

## 2015-03-09 MED ORDER — LACTULOSE 10 GM/15ML PO SOLN
20.0000 g | Freq: Two times a day (BID) | ORAL | Status: DC
Start: 2015-03-09 — End: 2015-03-10
  Administered 2015-03-10: 20 g via ORAL
  Filled 2015-03-09 (×2): qty 30

## 2015-03-09 MED ORDER — SPIRONOLACTONE 100 MG PO TABS
100.0000 mg | ORAL_TABLET | Freq: Every day | ORAL | 0 refills | Status: DC
Start: 2015-03-08 — End: 2015-03-09
  Filled 2015-03-09: fill #0

## 2015-03-09 MED ORDER — TRAMADOL HCL 50 MG PO TABS
50.0000 mg | ORAL_TABLET | Freq: Three times a day (TID) | ORAL | Status: DC | PRN
Start: 2015-03-09 — End: 2015-05-07

## 2015-03-09 MED ORDER — PREDNISOLONE 15 MG/5ML PO SOLN
40.0000 mg | Freq: Every day | ORAL | 0 refills | Status: AC
Start: 2015-03-09 — End: 2015-04-05
  Filled 2015-03-09: qty 481, 36d supply, fill #0
  Filled 2015-03-09: fill #0
  Filled 2015-03-10: qty 481, 36d supply, fill #0

## 2015-03-09 MED ORDER — LACTULOSE 10 GM/15ML PO SOLN
20.0000 g | Freq: Two times a day (BID) | ORAL | 0 refills | Status: AC
Start: 2015-03-09 — End: ?
  Filled 2015-03-09: fill #0
  Filled 2015-03-10: qty 237, 4d supply, fill #0

## 2015-03-09 MED ORDER — FUROSEMIDE 20 MG PO TABS
40.0000 mg | ORAL_TABLET | Freq: Every day | ORAL | 0 refills | Status: DC
Start: 2015-03-09 — End: 2015-03-10
  Filled 2015-03-09: fill #0
  Filled 2015-03-09: qty 60, 30d supply, fill #0

## 2015-03-09 MED ORDER — FUROSEMIDE 20 MG PO TABS
20.0000 mg | ORAL_TABLET | Freq: Every day | ORAL | 0 refills | Status: DC
Start: 2015-03-08 — End: 2015-03-09
  Filled 2015-03-09: fill #0

## 2015-03-09 MED ORDER — POTASSIUM CHLORIDE CRYS ER 20 MEQ PO TBCR
40.0000 meq | EXTENDED_RELEASE_TABLET | Freq: Once | ORAL | Status: AC
Start: 2015-03-09 — End: 2015-03-09
  Administered 2015-03-09: 40 meq via ORAL
  Filled 2015-03-09: qty 2

## 2015-03-09 MED ORDER — SPIRONOLACTONE 100 MG PO TABS
200.0000 mg | ORAL_TABLET | Freq: Every day | ORAL | Status: DC
Start: 2015-03-09 — End: 2015-03-10
  Administered 2015-03-09 – 2015-03-10 (×2): 200 mg via ORAL
  Filled 2015-03-09 (×2): qty 2

## 2015-03-09 MED ORDER — FOLIC ACID 1 MG PO TABS
1.0000 mg | ORAL_TABLET | Freq: Every day | ORAL | 0 refills | Status: AC
Start: 2015-03-08 — End: ?
  Filled 2015-03-09: fill #0

## 2015-03-09 MED ORDER — RIFAXIMIN 550 MG PO TABS
550.0000 mg | ORAL_TABLET | Freq: Two times a day (BID) | ORAL | 0 refills | Status: DC
Start: 2015-03-09 — End: 2015-03-10
  Filled 2015-03-09: fill #0

## 2015-03-09 MED ORDER — SPIRONOLACTONE 100 MG PO TABS
200.0000 mg | ORAL_TABLET | Freq: Every day | ORAL | 0 refills | Status: DC
Start: 2015-03-09 — End: 2015-03-10
  Filled 2015-03-09: fill #0

## 2015-03-09 NOTE — Plan of Care (Signed)
Problem: Pain  Goal: Patient's pain/discomfort is manageable  Outcome: Progressing  No acute changes in pt condition. Ultram and dilaudid given before bed per pt request and pt was able to sleep throughout the night. LLE swelling decreased with compression stocking and elevation.

## 2015-03-09 NOTE — Plan of Care (Signed)
Problem: Safety  Goal: Patient will be free from injury during hospitalization  Outcome: Progressing    Problem: Pain  Goal: Patient's pain/discomfort is manageable  Outcome: Progressing    Problem: Urinary Incontinence  Goal: Perineal skin integrity is maintained or improved  Assess genitourinary system, perineal skin, labs (urinalysis), and history of incontinence to include past management, aggravating, and alleviating factors. Collaborate with interdisciplinary team and initiate plans and interventions as needed.   Outcome: Progressing  Pain management with Tramadol, pt prefers IV pain med, MD notified. Pain will be primarily managed with PO pain meds for discharge planning, pt teaching done, he voiced understanding. Skin jaundiced, ascites with swelling in his lower back. Tea color urine noted but improving with diuretics administration. Refused Lactulose, large BM x 2 noted. OOB to chair by PT. Fall/safety precautions maintained including activation of bed alarm, fall mats remain in place. P: Pain management, monitor urine and bowel output, maintain safety. Discharge planning.

## 2015-03-09 NOTE — Progress Notes (Signed)
Chart reviewed.     Met with patient to discuss discharge plan. Case also discussed with management team as patient is uninsured. PT/OT recommendation for SNF with patient modified independent to CGA for functional mobility. Approval obtained for [facility] as indicated below.     Also following for SCM assistance for discharge medication. Will update once approval obtained.     Type of Home Health Service Provider Comments Length of needs SCM Approved by Pending Medicaid   Skilled Care TBD Patient lives in [facility], will need to confirm an agency 2 PT  2 SN for medication management Judeth Horn, Interm Tower Manager N/A   Non-Skilled Care          Case Management has completed a financial assessment to verify that the above disposition is not affordable to the patient/family.    Patient agreeable to discharge plan, reports his friend can transport him for discharge but would not be available until tomorrow evening after work (likely about 1800).     Will continue to follow for discharge needs.      Gregory [name], MSW  [facility] Discharge Planner  Medicine Unit [facility] [phone]  [phone]

## 2015-03-09 NOTE — Consults (Signed)
CATS CONSULTATION NOTE    Patient name: Gregory [name], Gregory [name]   Date of birth:10/01/1975  Age:  39  MRN:  [id number]  CSN:  [id number]  Date of admission:  02/22/2015  Date of consultation: 03/09/2015  Attending/Referring Physician:  Rafiq  ==================================================    REASON FOR ADMISSION:  Liver failure    REASON FOR CONSULTATION (Chief Complaint)  Alcohol abuse    HISTORY OF PRESENT ILLNESS:  39 year old male presented with a hemotoma to the leg following two falling ep[isodes. He was noted to be in liver failure and he has had large volume paracentesis he says since being here. He normally drinks 5 glasses of wine per day and this has been his pattern for 20 years. He has been unable to stop for long periods of time. He will drink in the mornings 3 times a week. He has not had withdrawal related seizures or hallucinations he tells me. His girlfriend is quite concerned. He has been unable to hold jobs because of the alcohol use. The AUDIT score is >8 and is therefore a positive screen.    SPECIFIC SUBSTANCES USED:    Alcohol only as above    PAST SUBSTANCE ABUSE TREATMENT HISTORY:    [place] [place] [facility] in 2006  He attends AA twice weekly      MEDICAL/SURGICAL/PSYCHIATRIC HISTORY:   He had an episode of liver failure 8 months ago as well . Untreated bipolar disorder.    SOCIAL HISTORY:    Unemployed. He lives onm his girlfriend's couch.        Review of Systems - Psychological ROS: positive for - anxiety, depression and sleep disturbances  negative for - disorientation, hallucinations, memory difficulties or suicidal ideation           MENTAL STATUS EXAM:    Mental Status Evaluation:     Appearance:  Severely jaundiced   Behavior:  Calm, cooperative, and no signs of psychomotor agitation/retardation.   Speech:  Fluid, purposeful; interruptible, with normal pitch, normal volume and  rate.    Mood:  " anxious "   Affect:  mood-congruent; redirectable; and appearing somewhat anxious    Thought Process:  Linear; goal directed and logical   Thought Content:  Pt denies any delusions, A/V Hallucinations and expressed no active thoughts of SI/HI.   Sensorium:   A&Ox 4 [person, place, time/date and situation]   Cognition:  grossly intact   Insight:  fair    Judgment:  fair         DATA REVIEWED:  03/09/2015 7:10 AM - Interface, Lab In [facility]      Component Results     Component Value Ref Range & Units Status    Bilirubin, Total 26.6 (H) 0.2 - 1.2 mg/dL Final    Bilirubin, Direct 18.0 (H) 0.0 - 0.5 mg/dL Final    Bilirubin, Indirect 8.6 (H) 0.0 - 1.1 mg/dL Final    AST (SGOT) 962 (H) 5 - 34 U/L Final    ALT 44 0 - 55 U/L Final    Alkaline Phosphatase 97 38 - 106 U/L Final    Protein, Total 7.3 6.0 - 8.3 g/dL Final    Albumin 2.0 (L) 3.5 - 5.0 g/dL Final    Globulin 5.3 (H) 2.0 - 3.6 g/dL Final    Albumin/Globulin Ratio 0.4 (L) 0.9 - 2.2 Final        03/09/2015 6:44 AM - Interface, Lab In [facility]  Component Value Ref Range & Units Status    WBC 17.07 (H) 3.50 - 10.80 x10 3/uL Final    Hgb 9.4 (L) 13.0 - 17.0 g/dL Final    Hematocrit 16.1 (L) 42.0 - 52.0 % Final    Platelets 177 140 - 400 x10 3/uL Final    RBC 2.69 (L) 4.70 - 6.00 x10 6/uL Final    MCV 98.9 80.0 - 100.0 fL Final    MCH 34.9 (H) 28.0 - 32.0 pg Final    MCHC 35.3 32.0 - 36.0 g/dL Final    RDW 27 (H) 12 - 15 % Final    MPV 10.7 9.4 - 12.3 fL Final    Nucleated RBC 0 0 - 1 /100 WBC             ALCOHOL USE DISORDER CHECKLIST:     1.Alcohol is often taken in larger amounts or over a longer period than was intended? Yes    2.There is a persistent desire or unsuccessful efforts to cut down or control alcohol use? Yes    3. A great deal of time is spent in activities necessary to obtain alcohol, use alcohol, or  recover from its effects?  Yes    4. Craving, or a strong desire or urge to use alcohol? No    5.Recurrent alcohol use resulting in a failure to fulfill major role obligations at work,  school, or home? Yes (work)    6.Continued alcohol use despite having persistent or recurrent social or interpersonal problems caused or exacerbated by the effects of alcohol? Yes    7. Important social, occupational, or recreational activities are given up or reduced because of alcohol use? Yes    8. Recurrent alcohol use in situations in which it is physically hazardous? No    9. Alcohol use is continued despite knowledge of having a persistent or recurrent physical or psychological problem that is likely to have been caused or exacerbated by alcohol? Yes (liver issues)    10. Tolerance, as defined by either of the following: a) A need for markedly increased amounts of alcohol to achieve intoxication or desired effect?  b) A markedly diminished effect with continued use of the same amount of alcohol? No    11. Withdrawal, as manifested by either of the following: a) The characteristic withdrawal syndrome for alcohol (refer to criteria A and B of the criteria set for alcohol withdrawal) b) Alcohol (or a closely related substance, such as a benzodiazepine) is taken to relieve or avoid withdrawal symptoms. Yes (insomnia)      ASSESSMENT & RECOMMENDATIONS:      1. Severe alcohol use disorder. As his renal function is good for now, I recommend we consider low dose Campral (333mg  BID) with a follow up BMP or CMP to assess renal function in a week. He is directed to the [facility] so as to inquire about available alcohol treatment services like group counseling. i encouraged continued AA participation.          Signed by:  Deanne Coffer, MD  CATS physician  [phone]  Date/Time: 03/09/2015 / 3:56 PM

## 2015-03-09 NOTE — Progress Notes (Signed)
MEDICINE PROGRESS NOTE    Date Time: 03/09/2015 6:28 AM  Patient Name: Gregory [name]  Attending Physician: Verdene Lennert, MD  Team Contact:   Medicine Resident Team A  Spectra: [phone]  Night: [phone]      Assessment:     Active Hospital Problems    Diagnosis   . Hematoma of left lower extremity   . Lactic acidosis   . Anemia, unspecified type   . Liver failure   . Liver failure without hepatic coma, unspecified chronicity   . Alcohol withdrawal, with unspecified complication   . Tachycardia   . Altered mental status, unspecified altered mental status type   . Increased ammonia level       39 year-old-male with history of alcoholic cirrhosis, now likely alcoholic hepatitis, SBP, and L thigh hematoma.     Plan:   #LFT elevation and hepatic cirrhosis  Likely alcoholic hepatitis. Discriminant score 70. HepBsAg, HepC Ab neg. ANA, ASMA, AFP neg. Paracentesis 6/15 showing only 6 PMNs (had already been on antibiotics), and SAAG 1.6 consistent with portal hypertension.   -Cont prednisolone 40 mg daily for 4 weeks  -Lactulose, titrate to 2-3 soft stools daily  -Rifaximin 550mg  BID  -aldactone and lasix daily     #Fevers   Resolved likely due to SBP   -monitor for further fever  -empirically treating SBP with ceftriaxone 2 gm daily, course completed today    #Anemia  Stable. Likely secondary to  Resolving L thigh hematoma due to recent fall noted on OSH CT. Normocytic but very high RDW, also possible macrocytosis/microcytosis.  -S/p multiple units PRBC  -Monitor CBC daily  -check reticulocytes, haptoglobin, folate, B12, iron    #L thigh hematoma  Recent fall with left sided hip trauma and L thigh hematoma noted on OSH CT, left lower extremity swelling more than right with negative venous Doppler on 6/16.  - Low dose tylenol and tramadol for pain   -wean off dilaudid IV    #Active alcohol abuse  Has not been requiring benzos.  - Discontinue CIWA protocol  - PO multivitamin, folic acid, and thiamine  - CATS consult  eventually     #Altered mental status  Improved.  Likely from dilaudid and ativan given outside hospital but can rule out hepatic encephalopathy. Improving slowly.   - Lactulose and rifaximin as above. Off CIWA, avoid sedating meds as much as possible (reduce tramadol dose).     #Sinus tachycardia   Likely multi-factorial, from pain, anxiety, EtOH withdrawal.   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 Transfuse as above.     #Distended gallbladder on outside CT scan  6/6 RUQ US showed mobile sludge seen within a distended gallbladder and edematous gallbladder wall is edematous (which may be partially due to the surrounding ascites), no gallstone.   - Per GI, exam not concerning, hold off on HIDA scan for now    #Global  DVT Prophalyxis: holding in setting of coagulopathy  Lines: peripheral IV  Fluids: None  Electrolytes: Monitor and replete as needed  Nutrition: Regular diet, 2gm sodium    Code: Full   Subjective   Chief Complaint  Altered mental status     Interval History/Subjective  Feeling well this morning, discussed with him he needs to be off of IV dilaudid before being discharged. Continuing on prednisolone. No complaints this morning aside from leg pain, abdominal fullness.     Objective   Physical Exam  Temp:  [  96.5 F (35.8 C)-97.6 F (36.4 C)] 97.6 F (36.4 C)  Heart Rate:  [77-84] 82  Resp Rate:  [18-19] 19  BP: (127-141)/(74-90) 138/90 mmHg  General: awake, alert, oriented x 3; no acute distress.  HEENT: perrla, eomi, icteric sclera though improving, mucous membranes moist  Neck: supple, no LAD, no thyromegaly, no JVD, no carotid bruits  Cardiovascular: fast rate regular rhythm, no murmurs, rubs, or gallops  Lungs: clear to auscultation bilaterally, without wheezing, rhonchi, or rales  Abdomen: tense, distended, tympanic, mild generalized tenderness, shifting dullness   Extremities: large L lateral thigh hematoma with ecchymotic skin changes,  with compression stocking significantly less edema, BLE warm to touch  Neuro: cranial nerves grossly intact, strength 5/5 in upper and lower extremities, sensation intact  Skin: jaundice, no spider angiomata         Intake/Output Summary (Last 24 hours) at 03/09/15 1610  Last data filed at 03/09/15 0600   Gross per 24 hour   Intake      0 ml   Output     50 ml   Net    -50 ml     Telemetry: None  Glucose: 90       Medications  Medications were reviewed.  Significant for:  Current Facility-Administered Medications   Medication Dose Route Frequency Last Rate Last Dose   . 0.9%  NaCl infusion   Intravenous PRN       . 0.9%  NaCl infusion   Intravenous PRN       . acetaminophen (TYLENOL) tablet 325 mg  325 mg Oral Q4H PRN   325 mg at 03/05/15 2307   . folic acid (FOLVITE) tablet 1 mg  1 mg Oral Daily   1 mg at 03/08/15 1001   . furosemide (LASIX) tablet 20 mg  20 mg Oral Daily   20 mg at 03/08/15 1001   . HYDROmorphone (DILAUDID) injection 0.1 mg  0.1 mg Intravenous Q3H PRN       . lactulose (CHRONULAC) 10 GM/15ML solution 30 g  30 g Oral 4 times per day   30 g at 03/08/15 2310   . metoclopramide (REGLAN) injection 10 mg  10 mg Intravenous Q6H PRN       . multivitamin (MULTIVITAMIN) tablet 1 tablet  1 tablet Oral Daily   1 tablet at 03/08/15 1001   . naloxone (NARCAN) injection 0.2 mg  0.2 mg Intravenous PRN       . pantoprazole (PROTONIX) injection 40 mg  40 mg Intravenous BID   40 mg at 03/08/15 1710   . prednisoLONE (ORAPRED) oral solution 40 mg  40 mg Oral Daily   40 mg at 03/08/15 1003   . rifaximin (XIFAXAN) tablet 550 mg  550 mg Oral BID   550 mg at 03/08/15 2300   . spironolactone (ALDACTONE) tablet 100 mg  100 mg Oral Daily   100 mg at 03/08/15 1001   . thiamine (B-1) tablet 100 mg  100 mg Oral Daily   100 mg at 03/08/15 1003   . traMADol (ULTRAM) tablet 50 mg  50 mg Oral Q8H PRN   50 mg at 03/08/15 2310         Labs    Recent Labs  Lab 03/08/15  0536 03/07/15  0426   SODIUM 134* 133*   POTASSIUM 3.5 3.6    CHLORIDE 105 106   CO2 20* 20*   BUN 21.0 22.0   CREATININE 0.8 0.8   CALCIUM 8.4* 8.4*  ALBUMIN 1.9* 1.8*   PROTEIN, TOTAL 6.9 6.7   BILIRUBIN, TOTAL 25.4* 24.9*   ALKALINE PHOSPHATASE 83 84   ALT 36 27   AST (SGOT) 106* 90*   GLUCOSE 90 106*         Recent Labs  Lab 03/08/15  0536 03/07/15  0426   WBC 17.34* 14.49*   HGB 7.8* 8.3*   HEMATOCRIT 22.3* 23.8*   PLATELETS 170 150         Recent Labs  Lab 03/08/15  0536 03/07/15  0426   PT 28.7* 28.5*   PT INR 2.8* 2.7*   PTT 44* 45*       Microbiology  Microbiology Results     Procedure Component Value Units Date/Time    CULTURE BLOOD AEROBIC AND ANAEROBIC [[id number]] Collected:  02/27/15 1709    Specimen Information:  Blood Updated:  03/05/15 0021    Narrative:      ORDER#: [id number]                                    ORDERED BY: Dewaine Conger  SOURCE: Blood blood                                  COLLECTED:  02/27/15 17:09  ANTIBIOTICS AT COLL.:                                RECEIVED :  02/27/15 21:53  Culture Blood Aerobic and Anaerobic        FINAL       03/05/15 00:21  03/05/15   No growth after 5 days of incubation.      CULTURE BLOOD AEROBIC AND ANAEROBIC [[id number]] Collected:  02/27/15 0906    Specimen Information:  Blood Updated:  03/04/15 1421    Narrative:      ORDER#: [id number]                                    ORDERED BY: Dewaine Conger  SOURCE: Blood blood                                  COLLECTED:  02/27/15 09:06  ANTIBIOTICS AT COLL.:                                RECEIVED :  02/27/15 11:42  Culture Blood Aerobic and Anaerobic        FINAL       03/04/15 14:21  03/04/15   No growth after 5 days of incubation.      Culture + Gram Zachary George Fluid [[id number]] Collected:  03/04/15 1529    Specimen Information:  Body Fluid from Peritoneal Fluid (Abdominal) Updated:  03/05/15 2021    Narrative:      ORDER#: [id number]                                    ORDERED BY: Nancie Neas  SOURCE: Peritoneal Fluid (Abdominal) peritoneal fluidCOLLECTED:   03/04/15 15:29  ANTIBIOTICS AT COLL.:                                RECEIVED :  03/04/15 18:53  Stain, Gram                                FINAL       03/04/15 21:32  03/04/15   No WBCs or organisms seen  Culture and Gram Stain, Aerobic, Body FluidPRELIM      03/05/15 20:23  03/05/15   Culture no growth to date. Final report to follow      MRSA culture [[id number]] Collected:  02/23/15 1413    Specimen Information:  Body Fluid from Nasal/Throat ASC Admission Updated:  02/24/15 1829    Narrative:      ORDER#: [id number]                                    ORDERED BY: Shawnie Pons  SOURCE: Nares and Throat                             COLLECTED:  02/23/15 14:13  ANTIBIOTICS AT COLL.:                                RECEIVED :  02/23/15 18:14  Culture MRSA Surveillance                  FINAL       02/24/15 18:29  02/24/15   Negative for Methicillin Resistant Staph aureus from Nares and             Negative for Methicillin Resistant Staph aureus from Throat      Urine culture [[id number]] Collected:  02/27/15 0853    Specimen Information:  Urine from Urine, Clean Catch Updated:  02/28/15 1343    Narrative:      ORDER#: [id number]                                    ORDERED BY: Dewaine Conger  SOURCE: Urine, Clean Catch                           COLLECTED:  02/27/15 08:53  ANTIBIOTICS AT COLL.:                                RECEIVED :  02/27/15 14:23  Culture Urine                              FINAL       02/28/15 13:43  02/28/15   No growth of >1,000 CFU/ML, No further work                Imaging  US Venous Duplex Doppler Leg Left    03/05/2015    Slightly limited exam without identification of peroneal veins. Otherwise there is no evidence of deep venous thrombosis involving the left lower extremity.      Clydie Braun  Riedy, MD  03/05/2015 5:34 PM         Case discussed with: Dr. Clydene Fake, attending physician

## 2015-03-09 NOTE — PT Progress Note (Addendum)
Physical Therapy Note    [facility]   Physical Therapy Treatment  Patient:  Gregory [name] MRN#:  [id number]  Unit: [facility]  Bed: F1016/F1016.01    Discharge Recommendations:   D/C Recommendations: SNF   DME Recommendations: Standard walker, Wheelchair-manual, BSC (recommend hip kit for ADLS, but defer to OT) if d/c to home will need transport into home up 4 stairs.    If SNF recommended discharge disposition is not available, patient will need cga for all oob mobility except max assist for stairs, standard walker, manual WC, reacher equipment, and continued skilled PT services.        Assessment:   Pt agreeable to work with PT despite c/o 10/10 pain in in L LE.  Pt has improved with mobility, but still not at PLOF.  Decreased safety and increased risk of falls with mobility 2/2 L LE pain and decreased strength.  Pt has L knee flexed with all functional mobility and WB activities increasing risk for buckling.  Highly recommend standard walker if Pt d/c to home to decrease risk of falls. Pt used FWW today, however has shag carpeting in home and probably would do better with standard walker.  Pt unsafe in bathroom 2/2 urgency to urinate made him move too quickly and release FWW.  Pt educated on POC and d/c recommendations. SW and RN educated on session outcome.  Assessment: Decreased LE ROM, Decreased LE strength, Decreased endurance/activity tolerance, Decreased functional mobility, Decreased balance, Gait impairment  Progress: Progressing toward goals  Prognosis: Good, With continued PT status post acute discharge  Risks/Benefits/POC Discussed with Pt/Family: With patient  Patient left without needs and call bell within reach. RN notified of session outcome.     Treatment Activities: gait training, functional transfer training, bed mobility, pt education/training, d/c planning.     Educated the patient to role of physical therapy, plan of care, goals of therapy and  safety with mobility and ADLs, discharge instructions, home safety.    Plan:   Treatment/Interventions: Investment banker, operational, Neuromuscular re-education, Functional transfer training, LE strengthening/ROM, Endurance training, Patient/family training, Equipment eval/education, Bed mobility, Compensatory technique education, Continued evaluation        PT Frequency: 3-4x/wk     Adapt plan with updated goals 2/2 Pt progress and increased frequency 2/2 Pt not able to participate more with PT.       Precautions and Contraindications:   Precautions  Weight Bearing Status: no restrictions  Other Precautions: falls, edema,     Updated Medical Status/Imaging/Labs:         US Venous Duplex Doppler Leg Left [VAS12] (Order [id number])    Status: Final result         Study Result      History: Left leg swelling.     IMPRESSION:     Slightly limited exam without identification of peroneal  veins. Otherwise there is no evidence of deep venous thrombosis  involving the left lower extremity.       Subjective:   Patient Goal: to decrease pain    Pain Assessment  Pain Assessment: Numeric Scale (0-10)  Pain Score: 10-severe pain  POSS Score: Awake and Alert  Pain Location: Leg;Hip;Buttocks  Pain Orientation: Left  Pain Descriptors: Sore;Radiating  Pain Frequency: Increases with movement;Constant/continuous  Effect of Pain on Daily Activities: moderate  Pain Intervention(s): Medication (See eMAR);Repositioned;Elevated;Rest  Multiple Pain Sites: No    Patient's medical condition is appropriate for Physical Therapy intervention at this time.  Patient is agreeable to participation in the therapy session. Nursing clears patient for therapy.    Objective:   Observation of Patient/Vital Signs:  Patient is in bed with peripheral IV in place.    Cognition/Neuro Status  Arousal/Alertness: Appropriate responses to stimuli  Attention Span: Appears intact  Orientation Level: Oriented X4  Memory: Appears intact  Following Commands: Follows multistep  commands with increased time;Follows multistep commands with repetition  Safety Awareness: minimal verbal instruction  Insights: Decreased awareness of deficits;Educated in safety awareness  Problem Solving: Assistance required to identify errors made;minimal assistance  Comments: unsafe with toileting 2/2 urgency   Behavior: cooperative;attentive;calm  Motor Planning: intact  Coordination: intact  Orientation Level: Oriented X4         Functional Mobility:  Supine to Sit: Modified Independent;Increased Effort;Increased Time  Scooting to EOB: Modified Independent;Increased Effort;Increased Time  Sit to Supine: Unable to assess (Comment) (up to chair)  Sit to Stand: Contact Guard Assist;Increased Effort;Increased Time (bed at home low, so did not elevated bed)  Stand to Sit: Contact Guard Assist  Transfers  Bed to Chair: Cabin crew Used for Functional Transfer: front-wheeled walker    Ambulation:  Ambulation: Contact Guard Assist;with front-wheeled walker  Ambulation Distance (Feet): 60 Feet  Pattern: decreased cadence;decreased step length;L flexed knee;L decreased stance time;Step to  Stair Management: not attempted;unable to perform (comment) (10/10 pain in L LE prior to AMB)                    Patient Participation: good  Patient Endurance: fair+    Patient left with call bell within reach, all needs met, SCDs placed back on B LEs, fall mat in place, bed alarm n/a, chair alarm placed and activated and all questions answered. RN notified of session outcome and patient response.     Goals:  Goals  Goal Formulation: With patient  Time for Goal Acheivement: 5 visits  Goals: Select goal  Pt Will Go Supine To Sit: with supervision, Goal met  Pt Will Perform Sit to Stand: Goal met, modified independent, New goal  Pt Will Transfer Bed/Chair: Goal met, modified independent, New goal  Pt Will Ambulate: with rolling walker, 31-50 feet, modified independent, Goal met, New goal      Carron Brazen,  DPT  Pager # [phone]      Time of Treatment  PT Received On: 03/09/15  Start Time: 1030  Stop Time: 1125  Time Calculation (min): 55 min  Treatment # 3 out of 5 visits

## 2015-03-09 NOTE — Progress Notes (Signed)
Attending Attestation:     I have seen and personally examined the patient.  I agree with the findings and exam as documented by Dr. 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 PRN tramadol  -HCC screening Q40months  -labs-BMP in 1 week  -d/c planning for am  -pt is willing to follow up with TCM clinic since his wife works in the area      Disposition:     Today's date: 03/09/2015  Admit Date: 02/22/2015  3:12 PM  Anticipated medical stability for discharge:Yellow - maybe tomorrow  Service status: Inpatient: risk of readmission  Reason for ongoing hospitalization: ETOH hepatitis  Anticipated discharge needs: tcm appt    Verdene Lennert, MD

## 2015-03-09 NOTE — Progress Notes (Addendum)
[facility]  PROGRESS NOTE  [facility] call: [phone], [phone]  [facility] call: [phone]  After hours call [phone]    Date Time: 03/09/2015 6:24 PM  Patient Name: Gregory [name], Gregory [name]      Chief Complaint:   Alcoholic hepatitis/cirrhosis    Assessment and Plan:   Assessment:   39 yo M with acute alcoholic hepatitis. MELD 30, DF 81.8  Also with likely cirrhosis, secondary to EtOH, complicated by ascites. Childs C.    1. Alcoholic hepatitis. DF is 81.8. MELD 30. LFTs stable. Hepatitis BsAg, Hep C Ab neg, Ceruloplasmin 15, iron/TIBC normal; ferritin 1988. AFP 7.1. ANA, mitochondrial Ab negative  2. Cirrhosis, c/b ascites. Childs C.   3. Anemia in setting of worsening large left hip hematoma, likely main source.No report of overt GI bleeding at this time.  4. Jaundice, secondary to #1  5. Alcohol abuse, active until admission  6.  Fever 6/11.  Cultures negative thus far.  Cell count from paracentesis 6/15 not c/w SBP, but this is in setting of ABX  7. Leukocytosis, likely multifactorial, including SIRS secondary to EtOH hepatitis, as well as steroids    Plan:   1. Continue prednisolone 40mg  daily, for a total 4-week course.  2. Continue course of broad spectrum ABX for presumed SBP (presenting w/ abdominal pain +fever)--( goal 7 days total)  3. PPI daily.  4. Follow H&H and continue to monitor for overt GI bleeding. 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 Follow LFTs, INR daily.   6. Continue Xifaxan 550 mg BID.   7. Optimize nutrition status (of critical importance in the setting of acute alcoholic hepatitis).   8. Aldactone 100mg  daily and Lasix 20mg  daily  9. Low-Na (<2 grams daily) diet.  10. We again discussed the importance of EtOH abstinence, especially given acute alcoholic hepatitis. The pt states he will abstain from EtOH upon discharge.    11. Will sign off--ok for discharge home from GI perspective        Subjective:   He continues to feel "ok." He c/o abdominal distension but denies abdominal pain. Afebrile overnight.   Stooling frequency has improved. Urine remains dark.   He denies confusion and tremors.      Medications:     Current Facility-Administered Medications   Medication Dose Route Frequency   . folic acid  1 mg Oral Daily   . furosemide  40 mg Oral Daily   . lactulose  20 g Oral BID   . multivitamin  1 tablet Oral Daily   . pantoprazole  40 mg Intravenous BID   . prednisoLONE  40 mg Oral Daily   . rifaximin  550 mg Oral BID   . spironolactone  200 mg Oral Daily   . thiamine  100 mg Oral Daily       Review of Systems:   Complete 12 Point ROS negative or normal except those mentioned in HPI above.    Physical Exam:     Filed Vitals:    03/09/15 1631   BP: 140/81   Pulse: 91   Temp: 98 F (36.7 C)   Resp: 18   SpO2: 95%     General appearance: Chronically-ill appearing, appears stated age and in NAD, tremulous  Eyes: +scleral icterus, pink conjunctivae, no ptosis  ENMT: mucous membranes moist, nose and ears appear normal.  Oropharynx clear.  Chest: Non labored respirations, no audible wheezing,  no clubbing or cyanosis  CV:  + B/L ankle edema  Abdomen: softly distended, non-tender throughout, no masses or organomegaly  Skin: +Jaundice, normal turgor, no rashes, no suspicious skin lesions noted  Neuro: CN II-XII grossly intact.  No gross movement disorders noted. No asterixis  Mental status: Appropriate affect, alert and oriented x 3    Labs:     Recent Labs      03/09/15   0616  03/08/15   0536   WBC  17.07*  17.34*   HGB  9.4*  7.8*   HEMATOCRIT  26.6*  22.3*   PLATELETS  177  170   MCV  98.9  99.1       Recent Labs      03/09/15   0618  03/08/15   0536   SODIUM  135*  134*   POTASSIUM  3.6  3.5   CHLORIDE  104  105   CO2  22  20*   BUN  21.0  21.0   CREATININE  0.8  0.8   GLUCOSE  91  90   CALCIUM  8.7  8.4*       Recent Labs      03/09/15   0618  03/08/15   0536   AST (SGOT)  115*  106*   ALT  44  36    ALKALINE PHOSPHATASE  97  83   BILIRUBIN, TOTAL  26.6*  25.4*   BILIRUBIN, DIRECT  18.0*  16.7*   PROTEIN, TOTAL  7.3  6.9   ALBUMIN  2.0*  1.9*       Recent Labs      03/09/15   0617  03/08/15   0536   PTT  45*  44*   PT  27.0*  28.7*   PT INR  2.6*  2.8*        Radiology:   Radiological Procedure reviewed:  [place] RUQ 02/23/15:   1. The liver demonstrates a coarsened echotexture with a mildly nodular contour. There is no intrahepatic biliary ductal dilatation or mass.  2. A moderate amount of mobile sludge is identified within a distended gallbladder. There is no gallstone. The gallbladder wall is edematous and may be partially due to the surrounding ascites. If there is high clinical concern of acute cholecystitis a HIDA scan is recommended.  3. There is no hydronephrosis within the right kidney. There is a 1.0 cm benign appearing upper pole cyst.  4. Ascites. The remainder is as above.    CT A/P without contrast 02/28/15:  1. Stable large intramuscular hematoma surrounding left hip and upper thigh involving left gluteus musculature and lateral abductors.  2. No retroperitoneal hemorrhage. Unchanged large ascites.

## 2015-03-10 ENCOUNTER — Other Ambulatory Visit: Payer: Self-pay

## 2015-03-10 LAB — CBC
Hematocrit: 26.9 % — ABNORMAL LOW (ref 42.0–52.0)
Hgb: 9.4 g/dL — ABNORMAL LOW (ref 13.0–17.0)
MCH: 34.8 pg — ABNORMAL HIGH (ref 28.0–32.0)
MCHC: 34.9 g/dL (ref 32.0–36.0)
MCV: 99.6 fL (ref 80.0–100.0)
MPV: 10.9 fL (ref 9.4–12.3)
Nucleated RBC: 0 /100 WBC (ref 0–1)
Platelets: 158 10*3/uL (ref 140–400)
RBC: 2.7 10*6/uL — ABNORMAL LOW (ref 4.70–6.00)
RDW: 27 % — ABNORMAL HIGH (ref 12–15)
WBC: 15.54 10*3/uL — ABNORMAL HIGH (ref 3.50–10.80)

## 2015-03-10 LAB — HEPATIC FUNCTION PANEL
ALT: 54 U/L (ref 0–55)
AST (SGOT): 122 U/L — ABNORMAL HIGH (ref 5–34)
Albumin/Globulin Ratio: 0.4 — ABNORMAL LOW (ref 0.9–2.2)
Albumin: 1.9 g/dL — ABNORMAL LOW (ref 3.5–5.0)
Alkaline Phosphatase: 90 U/L (ref 38–106)
Bilirubin Direct: 16.9 mg/dL — ABNORMAL HIGH (ref 0.0–0.5)
Bilirubin Indirect: 8.6 mg/dL — ABNORMAL HIGH (ref 0.0–1.1)
Bilirubin, Total: 25.5 mg/dL — ABNORMAL HIGH (ref 0.2–1.2)
Globulin: 5.2 g/dL — ABNORMAL HIGH (ref 2.0–3.6)
Protein, Total: 7.1 g/dL (ref 6.0–8.3)

## 2015-03-10 LAB — FOLATE RBC
Hematocrit: 28.4 %
RBC Folate: 862 ng/mL (ref >280)

## 2015-03-10 LAB — PT AND APTT
PT INR: 2.7 — ABNORMAL HIGH (ref 0.9–1.1)
PT: 28 s — ABNORMAL HIGH (ref 12.6–15.0)
PTT: 42 s — ABNORMAL HIGH (ref 23–37)

## 2015-03-10 LAB — METHYLMALONIC ACID, SERUM: Methylmalonic, Acid: 132 nmol/L (ref 87–318)

## 2015-03-10 LAB — BASIC METABOLIC PANEL
BUN: 22 mg/dL (ref 9.0–28.0)
CO2: 22 mEq/L (ref 22–29)
Calcium: 8.5 mg/dL (ref 8.5–10.5)
Chloride: 103 mEq/L (ref 100–111)
Creatinine: 0.8 mg/dL (ref 0.7–1.3)
Glucose: 118 mg/dL — ABNORMAL HIGH (ref 70–100)
Potassium: 3.6 mEq/L (ref 3.5–5.1)
Sodium: 133 mEq/L — ABNORMAL LOW (ref 136–145)

## 2015-03-10 LAB — GFR: EGFR: >60

## 2015-03-10 MED ORDER — POTASSIUM CHLORIDE CRYS ER 20 MEQ PO TBCR
40.0000 meq | EXTENDED_RELEASE_TABLET | Freq: Once | ORAL | Status: AC
Start: 2015-03-10 — End: 2015-03-10
  Administered 2015-03-10: 40 meq via ORAL
  Filled 2015-03-10: qty 2

## 2015-03-10 MED ORDER — PREDNISOLONE SODIUM PHOSPHATE 15 MG/5ML PO SOLN
ORAL | 0 refills | Status: DC
Start: 2015-03-10 — End: 2015-03-10
  Filled 2015-03-10: qty 481, 36d supply, fill #0

## 2015-03-10 MED ORDER — FUROSEMIDE 20 MG PO TABS
20.0000 mg | ORAL_TABLET | Freq: Every day | ORAL | 0 refills | Status: DC
Start: 2015-03-10 — End: 2015-05-08
  Filled 2015-03-10: qty 30, 30d supply, fill #0
  Filled 2015-03-10: fill #0

## 2015-03-10 MED ORDER — SPIRONOLACTONE 100 MG PO TABS
100.0000 mg | ORAL_TABLET | Freq: Every day | ORAL | 0 refills | Status: AC
Start: 2015-03-10 — End: ?
  Filled 2015-03-10: fill #0
  Filled 2015-03-10: qty 30, 30d supply, fill #0

## 2015-03-10 MED ORDER — POTASSIUM CHLORIDE CRYS ER 20 MEQ PO TBCR
20.0000 meq | EXTENDED_RELEASE_TABLET | Freq: Every day | ORAL | 0 refills | Status: DC
Start: 2015-03-10 — End: 2015-04-20
  Filled 2015-03-10: fill #0
  Filled 2015-03-10: qty 30, 30d supply, fill #0

## 2015-03-10 NOTE — Progress Notes (Signed)
Attending Attestation:     I have seen and personally examined the patient.  I agree with the findings and exam as documented by Dr. 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  # Ascites 2/2 to above  # ETOH abuse  # Fever now resolved  # Left thigh hematoma    Plan:  -cont lasix and aldactone  -cont lactulose and rifaximin  -currently on no abx   -cont prednisolone 4 weeks  -HCC screening Q5months  -labs-BMP in 1 week  -d/c home today with his wife  -d/c with tramadol  -pt is willing to follow up with TCM clinic since his wife works in the area      Disposition:     Today's date: 03/10/2015  Admit Date: 02/22/2015  3:12 PM  Anticipated medical stability for discharge:today  Service status: Inpatient: risk of readmission  Reason for ongoing hospitalization: ETOH hepatitis  Anticipated discharge needs: tcm appt    Verdene Lennert, MD

## 2015-03-10 NOTE — Progress Notes (Signed)
Chart reviewed. Order for discharge written [phone].     DISCHARGE DISPOSITION: SCM HHC (see previous CM note for detail)  HHC PROVIDER: TBD    SCM assistance approved for discharge medication as indicated below. [place] Planner to schedule clinic follow up also. Patient friend will transport this evening, after work, likely about 1800.     Systems Case Management Progress Note      Medication furosemide 20 MG tablet   Commonly Known As: LASIX        lactulose 10 GM/15ML solution   Commonly Known As: CHRONULAC        potassium chloride 20 MEQ tablet   Commonly Known As: K-DUR,KLOR-CON        prednisoLONE 15 mg/5 mL solution   Commonly Known As: PRELONE        spironolactone 100 MG tablet   Commonly Known As: ALDACTONE           Approved amount of medication 1 month lasix, full prescriptions of remaining   Cost $189.02   Financial Assessment completed per [facility] services policy Completed   Ancillary Pharmacy Form signed and turned into [facility] Completed   Comments    SCM Approved by: Theador Hawthorne, CMII       Case Management has verified that the medication listed above is not accessible or affordable to the patient/family.     Arlie Solomons, MSW  [facility] Discharge Planner  Medicine Unit [facility] [phone]  [phone]

## 2015-03-10 NOTE — Discharge Summary (Signed)
MEDICINE DISCHARGE SUMMARY    Date Time: 03/10/2015 2:18 PM  Patient Name: Gregory [name]  Attending Physician: Verdene Lennert, MD  Primary Care Physician: Christa See, MD    Date of Admission: 02/22/2015  Date of Discharge: 03/10/2015    Discharge Diagnoses:     Active Problems:    Liver failure    Anemia, unspecified type  Resolved Problems:    Liver failure without hepatic coma, unspecified chronicity    Alcohol withdrawal, with unspecified complication    Tachycardia    Altered mental status, unspecified altered mental status type    Increased ammonia level    Lactic acidosis    Hematoma of left lower extremity      Disposition:     home with family    Pending Results, Recommendations & Instructions to providers after discharge:     1. Micro / Labs / Path pending: RBC folate, methylmalonic acid  2. Wound Care Instructions: N/A  3. Date of completion for antibiotics or other medications: Prednisolone 6/18-7/18  4. Other: N/A    Recent Labs - Last 2:         Recent Labs  Lab 03/10/15  0453 03/09/15  0616   WBC 15.54* 17.07*   HGB 9.4* 9.4*   HEMATOCRIT 26.9* 26.6*   PLATELETS 158 177         Recent Labs  Lab 03/10/15  0806   PT 28.0*   PT INR 2.7*   PTT 42*       Recent Labs  Lab 03/10/15  0453 03/09/15  0618   ALKALINE PHOSPHATASE 90 97   BILIRUBIN, TOTAL 25.5* 26.6*   BILIRUBIN, DIRECT 16.9* 18.0*   PROTEIN, TOTAL 7.1 7.3   ALBUMIN 1.9* 2.0*   ALT 54 44   AST (SGOT) 122* 115*        Recent Labs  Lab 03/10/15  0453 03/09/15  0618   SODIUM 133* 135*   POTASSIUM 3.6 3.6   CHLORIDE 103 104   CO2 22 22   BUN 22.0 21.0   GLUCOSE 118* 91   CALCIUM 8.5 8.7                 Invalid input(s): FREET4         Procedures/Radiology performed:     Ct Abdomen Pelvis Wo Iv/ Wo Po Cont    02/28/2015    1. Stable large intramuscular hematoma surrounding left hip and upper thigh involving left gluteus musculature and lateral abductors. 2. No retroperitoneal hemorrhage. Unchanged large ascites.  Charlott Rakes, MD  02/28/2015 9:41  PM     Xr Lumbar Spine Ap And Lateral    02/17/2015   IMPRESSION: No acute osseous abnormality of the lumbar spine.   ReadingStation:[id number]    US Abdomen Limited Ruq    02/23/2015    1. The liver demonstrates a coarsened echotexture with a mildly nodular contour. There is no intrahepatic biliary ductal dilatation or mass. 2. A moderate amount of mobile sludge is identified within a distended gallbladder. There is no gallstone. The gallbladder wall is edematous and may be partially due to the surrounding ascites. If there is high clinical concern of acute cholecystitis a HIDA scan is recommended. 3. There is no hydronephrosis within the right kidney. There is a 1.0 cm benign appearing upper pole cyst. 4. Ascites. The remainder is as above. Stephannie Peters, MD  02/23/2015 8:36 AM     Xr Chest Ap Portable  02/24/2015    Basal opacities and left pleural effusion increased.  Clide Cliff, MD  02/24/2015 10:00 AM     Xr Chest  Ap Portable    02/22/2015   Impression: 1. Left basilar airspace disease and right basilar atelectasis. 2. Left pleural fluid/thickening.  Pennelope Bracken, MD  02/22/2015 4:17 PM     Xr Pelvis Portable    02/17/2015    No acute osseous abnormality of the pelvis.  ReadingStation:[id number]    US Venous Duplex Doppler Leg Left    03/05/2015    Slightly limited exam without identification of peroneal veins. Otherwise there is no evidence of deep venous thrombosis involving the left lower extremity.      Will Bonnet, MD  03/05/2015 5:34 PM     US Abdomen Limited Single Organ    02/26/2015   Impression: Moderate amount of ascites.  Pennelope Bracken, MD  02/26/2015 3:11 PM        Hospital Course:     Reason for admission/ HPI: From admission H&P by Dr. Lequita Halt:  "Gregory [name] is a 39 y.o. male who presents to the hospital with jaundice and emesis to an outside hospital. Per documentation, it appears that he had some left sided flank pain after a fall last week. He went to the emergency room at  that time and had x-rays done which showed no fractures. He is transferred here from [facility] emergency room. On arrival he was obtunded, but it appears as though he received both Ativan and Dilaudid at the outside hospital. Per EMS reports he consumed alcohol at a wedding yesterday. Unable to take adequate history at this time due to patient's mental status. He had an CT scan done at the outside hospital which was reviewed with one of our radiologists here. Noted a distended gallbladder, large left hip intramuscular hematoma, large ascites, left-sided pleural effusion, no free air in the abdomen but evidence of right colonic wall thickening concerning for colitis."    Hospital Course: 6/5-6/21    Alcoholic hepatitis, cirrhosis  He was noted to have LFT elevation AST 304 ALT 37 Alk Phos 150 Tbili 5.8 indirect bili 3.8, compatible with alcoholic hepatitis.  His INR is between 2 and 2.8 during the hospitalization. The GI team was also consulted, and numerous serologic labs were obtained including HepBsAg, HepC Ab neg. ANA, ASMA, AFP which were all negative. His discriminate function was from 60-70.  He was started on Lasix, Aldactone, rifaximin, lactulose.  During acute treatment of fevers with antibiotics prednisolone was withheld but was started at 40 mg per day on 6/16.  Gastroenterology planned a four-week treatment on prednisolone 40 mg daily.  Due to active alcohol use is not considered candidate for transplant.  He was discharged on prednisolone 40 mg daily (through 04/04/15) Lasix 20 mg daily, Aldactone 100 mg daily, lactulose 20 mg twice a day with instructions to titrate to 2-3 bowel movements a day.  He was unable to be discharged on rifaximin because his medications were paid for by charity and they would not cover rifaximin (ideally he would be on 550 mg twice a day).  He was also discharged with potassium supplementation 20 mEq daily due to the Lasix, he had been requiring potassium repletion despite  spironolactone.  He is instructed to follow-up with transitional care clinic and we recommend checking a BMP at his appointment, around 1 week.    Anemia  Initial hemoglobin was 6.5 most likely due to left thigh  hematoma as below.  He was given multiple units PRBC without inappropriate responses, likely due to expanding hematoma.  His MCV was at the high end of normocytic (99.6) however RDW is very elevated, initially 22, 27 at the time of discharge.  His B12 was in normal range, his serum folate was low.  Red blood cell folate was pending at the time of discharge.  Due to alcohol use folate was being supplemented.  His reticulocyte index was very elevated at 6.5. Haptoglobin was undetectable (liver disease?) however LDH was within normal limits so a hemolytic anemia was thought to be unlikely.      Fevers, suspected SBP  The patient developed fevers 6/9 through 6/11 up to 101.9.  His abdomen was noted to be tender no other source of infection was found.  He was thought to have either SBP or fever due to alcoholic hepatitis. He was treated with antibiotics empirically for spontaneous bacterial peritonitis.  He completed a 7 day course on ciprofloxacin and then ceftriaxone.  Paracentesis was not performed until 6/15 at which point peritoneal fluid was negative for SBP however the sensitivity was likely decreased given preceding antibiotics.  His fevers resolved and did not recur.  He was discharged off of antibiotics.    Left thigh hematoma  On admission, he was also noted to be anemic to Hb 6.5 which was felt most likely due to left thigh hematoma from a recent fall. He also had INR elevation to 2.3, and thrombocytopenia PLT 64. He was given multiple pRBC, platelet, and FFP transfusions but with minimal improvement.The hematoma was also noted to be enlarging rapidly so the orthopedic team was consulted and he was monitored closely for signs of compartment syndrome which he did not develop.  The patient had swelling  of his distal left lower extremity.  A Doppler ultrasound was negative for deep venous thrombosis.  He was discharged on Lasix as above and with compression stocking.    Alcohol abuse  Initially treated on CIWA protocol due to concern for alcohol withdrawal.  At the time of discharge she was not receiving any benzodiazepine's.  The importance of abstinence from alcohol was emphasized.  Dr. Dawna Part from [facility] was consulted and referred him to Alcoholics Anonymous.    Altered mental status  On presentation the patient's mental status was depressed, initially felt to be most likely secondary to effects of Dilaudid and Ativan which she had been prescribed prior to transfer.  He was also treated on CIWA protocol for alcohol withdrawal.  His mental status improved, especially with initiation of prednisolone, he was AO 3 at the time of discharge.    Thrombocytopenia  Most likely secondary to cirrhosis.  Nadir at 39 soon after admission and rose steadily throughout the hospitalization. On discharge his platelet count was 158.  He was recommended to follow-up with the transitional care clinic.    Distended gallbladder  Imaging the outside hospital showed a distended gallbladder on CT scan.  He had a right upper quadrant ultrasound which showed mobile sludge, distended gallbladder, edematous gallbladder wall (possibly partially due to surrounding ascites) but no gallstones.  Per the gastroenterologist exam was not concerning and no HIDA was obtained.      Discharge Day Exam:  Temp:  [97.7 F (36.5 C)-98.5 F (36.9 C)] 98.5 F (36.9 C)  Heart Rate:  [89-102] 102  Resp Rate:  [16-20] 20  BP: (117-140)/(75-81) 117/79 mmHg  General: awake, alert, oriented x 3; no acute distress.  HEENT: perrla,  eomi, icteric sclera though improving, mucous membranes moist  Neck: supple, no LAD, no thyromegaly, no JVD, no carotid bruits  Cardiovascular: fast rate regular rhythm, no murmurs, rubs, or gallops  Lungs: clear to auscultation  bilaterally, without wheezing, rhonchi, or rales  Abdomen: tense, distended, tympanic, mild generalized tenderness, shifting dullness   Extremities: large L lateral thigh hematoma with ecchymotic skin changes, with compression stocking significantly less edema, BLE warm to touch  Neuro: cranial nerves grossly intact, strength 5/5 in upper and lower extremities, sensation intact  Skin: jaundice, no spider angiomata    Consultations:     Treatment Team: Attending Provider: Verdene Lennert, MD; Consulting Physician: Lestine Mount, MD; Resident: Elveria Royals, MD; Resident: Anselm Pancoast, MD; Case Manager: Arlie Solomons; Case Manager: Ashok Pall, RN; Registered Nurse: Michail Jewels, RN    Discharge Condition:     Stable    Discharge Instructions & Follow Up Plan for Patient:     Diet: Diet regular Na restriction, if any:: 2 GM NA; Fluid restriction:: 2000 ML FLUID    Activity/Weight Bearing Status: No restrictions      Patient was instructed to follow up with:   Follow-up Information     Follow up with [facility] [place] On 03/12/2015.    Why:  2:30PM - hospital follow up scheduled on your behalf. Please call to reschedule if you are unable to keep this appointment.     Contact information:    [address] [place]  [address]  [place] [address]  [phone]        Follow up with Jacqulyn Cane, MD.    Specialties:  Gastroenterology, Internal Medicine    Contact information:    [address]  [place] [address]  [phone]            Discharge Code Status: Full Code  Patient Emergency Contact:Extended Emergency Contact Information  Primary Emergency Contact: McNeil,Kayla  Address: [address]           [address]           [place], [place] [address] [place]  Home Phone: [phone]  Mobile Phone: [phone]  Relation: Other    Complete instructions and follow up are in the patient's After Visit Summary    Minutes spent coordinating discharge and  reviewing discharge plan: 35 minutes    Discharge Medications:        Medication List      START taking these medications          folic acid 1 MG tablet   Commonly known as:  FOLVITE   Take 1 tablet (1 mg total) by mouth daily.       furosemide 20 MG tablet   Commonly known as:  LASIX   Take 1 tablet (20 mg total) by mouth daily.       lactulose 10 GM/15ML solution   Commonly known as:  CHRONULAC   Take 30 mLs (20 g total) by mouth 2 (two) times daily. Adjust the amount so that you have 2-3 bowel movements per day       potassium chloride 20 MEQ tablet   Commonly known as:  K-DUR,KLOR-CON   Take 1 tablet (20 mEq total) by mouth daily.       prednisoLONE 15 mg/5 mL solution   Commonly known as:  PRELONE   Take 13.3 mLs (40 mg total) by mouth daily.       spironolactone  100 MG tablet   Commonly known as:  ALDACTONE   Take 1 tablet (100 mg total) by mouth daily.       traMADol 50 MG tablet   Commonly known as:  ULTRAM   Take 1 tablet (50 mg total) by mouth every 8 (eight) hours as needed.         STOP taking these medications          LORazepam 1 MG tablet   Commonly known as:  ATIVAN       oxyCODONE 5 MG capsule   Commonly known as:  OXY-IR         Where to Get Your Medications     You need to pick up these prescriptions. We sent some of them to a specific pharmacy. Go to these places to get your medications.           [facility]   -  folic acid 1 MG tablet   -  furosemide 20 MG tablet   -  lactulose 10 GM/15ML solution   -  potassium chloride 20 MEQ tablet   -  prednisoLONE 15 mg/5 mL solution   -  spironolactone 100 MG tablet    [address]   [place] [address]   Phone:  [phone]   Hours:  Monday-Friday 8A to 8P, Saturday/Sunday 9A to 5P              You may get the following medications from any pharmacy   -  traMADol 50 MG tablet                      [facility]  Department of Medicine  P: [phone]  F: [phone]    Signed by:  Anselm Pancoast, MD PGY-1    CC: Christa See, MD    CC. Clydie Braun, MD

## 2015-03-10 NOTE — Progress Notes (Signed)
[facility] Liaison spoke with intake at [facility]. They are not currently accepting [facility] patients. Referral deferred.

## 2015-03-10 NOTE — Discharge Instructions (Addendum)
You were admitted to the hospital with altered mental status, liver failure, anemia, and a large left thigh hematoma (bruise). For your alcohol abuse, we continued to monitor you as you withdrew with alcohol and we gave you ativan to make you more comfortable as you body continued to clear the alcohol and adjust to not having alcohol in your system. We encourage you to decrease your alcohol consumption to improve your health. For your anemia, or low red blood cells, we gave you several blood transfusions and continued to monitor your blood levels. 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 To correct this, we transfused platelets and plasma. The alcoholic liver failure also interfered with the ability of your liver to metabolize toxins, which lead to altered mental status. We gave you medication to increase your body's ability to excrete these toxins in your stool. Regarding your left thigh hematoma, we continued to monitor the perfusion of your left lower extremity.     Follow-up with the transitional care clinic.     You were seen by the gastroenterologist Dr. Ernst Bowler.     You should also get checked every 6 months for Hepatocellular carcinoma (HCC).      Alcohol Abuse  Alcoholic drinks are harmful when you have too many of them. Drinking that disrupts your life is called alcohol abuse. Alcohol abuse can hurt your relationships with others. You may lose friends, a spouse, or even your job. You may be abusing alcohol if any of the following are true for you:   Duties at home or with child care suffer because of drinking.   Duties at work or in school suffer because of drinking.   You have missed work or school because of drinking.   You use alcohol while driving or operating machinery.   You have legal problems such as arrests due to drinking.   You keep drinking even though it causes serious problems in your life.  Alcohol abuse can cause health  problems. Too much alcohol can cause disease of the liver and pancreas. It can damage your brain and your heart. It raises your risk of cancer. It can lead to sexual problems and make it hard to conceive children. Alcohol abuse in pregnancy can hurt the baby. Alcohol affects how you think and respond. You are more likely to die in an accident or fire if you have been drinking.  Home Care   Admit you have a problem with alcohol.   Ask for help from your healthcare provider as well as trusted family members or close friends.   Get help from people trained in dealing with alcohol abuse. This may be individual counseling or group therapy, or it may be a supervised alcohol treatment program.   Join a self-help group for alcohol abuse such as Alcoholics Anonymous (AA).   Avoid people who abuse alcohol or tempt you to drink.  Follow Up  as advised by the doctor or our staff. Contact these groups to get help:   Alcoholics Anonymous (AA): Go to SalaryStart.tn or check the phone book for meetings near you.   National Alcohol and Substance Abuse Information Center [facility]): 414-140-2735 www.addictioncareoptions.com   [employer] on Alcoholism and Drug Dependence (NCADD): 800-NCA-CALL (515) 620-8017) www.ncadd.org   Al-Anon: 888-4AL-ANON (478-2956) www.al-anon.org  Get Prompt Medical Attention  if you have:   Confusion   Hallucinations (seeing, hearing, or feeling things that aren't there)   Extreme drowsiness or inability  to awaken   Increasing upper abdominal pain   Repeated vomiting, vomiting blood, or black or tarry stools   Severe shakiness or fast heart rate   Seizure (convulsion)   2000-2015 [employer] [address], [place], [place] [address]. All rights reserved. This information is not intended as a substitute for professional medical care. Always follow your healthcare professional's instructions.    Cirrhosis of the Liver  You have been diagnosed with cirrhosis of the liver. Cirrhosis is  a chronic liver problem that occurs when liver tissue is destroyed and replaced by scar tissue. Causes of cirrhosis include infection such as viral hepatitis, chronic alcoholism, and genetic diseases.Signs of cirrhosis may be absent or only mild at first, but they usually get progressively worse. Cirrhosis is likely to occur if you have a history of alcohol abuse. Cirrhosis can't be cured,but it can be treated.More than 3 in 10 peoplepeople with cirrhosis are admitted back to the hospital in less than 4 weeks.  Home care    Don't drink alcohol. If you stop drinking now, you will feel better and live longer.   Find out about local support groups such as Alcoholics Anonymous in the phone book or online at SalaryStart.tn if you have been diagnosed with alcohol abuse or dependence. People with liver disease should not drink alcohol.   If alcohol is a problem, ask your doctor about medication that can help you quit drinking.   Cut back on salt.   Limit canned, dried, packaged, and fast foods.   Don't add salt to your food at the table.   Season foods with herbs instead of salt when you cook.   Take your medications exactly as directed.   Talk to your doctor about vitamin supplements as well as any over the counter medications.   Avoid aspirin and other blood-thinning medications.   Ask your doctor about what kind of diet you should follow. You may be asked to limit or not eat certain foods. Do not limit your protein intake.   Ask your doctor about receiving vaccinations for other viruses that can cause liver diseases.   Weigh yourself daily and keep a weight log. If you have a sudden change in weight, call your health care provider.   Discuss vitamin supplements and deficiencies with your provider .  Follow-up care  Make a follow-up appointment as directed by our staff for lab tests, blood tests for liver cancer, and imaging of your liver (ultrasound every 6 months), and endoscopy to evaluate for  varices.      Bruises (Contusions)  A contusion is a bruise. A bruise happenswhen a blow to your body doesn't break the skin but does break blood vessels beneath the skin. Blood leaking from the broken vessels causes redness and swelling. As it heals, your bruise is likely to turn colors like purple, green, and yellow. This is normal. The bruise should fade in 2 or 3 weeks.    Factors that make you more likely to bruise  Almost everyone bruises now and then. Certain people do bruise more easily than others. You're more prone to bruising as you get older. That's because blood vessels become more fragile with age. You're also more likely to bruise if you have a clotting disorder such as hemophilia or take medications that reduce clotting, including aspirin.  When to go to the emergency room (ER)  Bruises almost always heal on their own without special treatment. But for some people, a bad bruise  can be serious. Seek medical care if you:   Have a clotting disorder such as hemophilia.   Have cirrhosis or other serious liver disease.   Takeblood-thinning medications such as warfarin (Coumadin).    What to expect in the ER  A doctor will examine your bruise and ask about any health conditions you have. In some cases, you may have a test to check how well your blood clots. Other treatment will depend on your needs.  Follow-up care  Sometimes a bruise gets worse instead of better. It may become larger and more swollen. This can occur when your body walls off a small pool of blood under the skin (hematoma). In very rare cases, your doctor may need to drain excess blood from the area.      [facility]    An appointment has been scheduled for you at the  [facility] for:      Thursday, June 23 at 2:30PM    At Location    [address] [place]  [address]  [place], [place] [address]  [phone]    Please bring the following with you to your appointment:  ? Medications in their  original bottles  ? Glucometer/blood sugar log (if diabetic)  ? Weight log (if heart failure)  ? Proof of income (to enroll in medication assistance programs-last tax return or last 2 months of pay stubs)    If this is your initial appointment, please come 15 minutes prior to scheduled appointment time to complete initial appointment forms.

## 2015-03-10 NOTE — OT Progress Note (Signed)
[facility]   Occupational Therapy Treatment     Patient: Gregory [name]    MRN#: [id number]   Unit: [facility]  Bed: F1016/F1016.01      Discharge Recommendations:   Discharge Recommendation: Home with family assist, home health OT   DME Recommended for Discharge: Wheelchair-manual, Front wheel walker, BSC, Tub transfer bench    If Home with home health OT recommended discharge disposition is not available, patient will need SNF.     Assessment:   Received pt supine in bed all dressed to go home. Pt is alert and oriented followed all directions,continues to c/o discomfort left thigh and hip area.strength both UEs WFL. Worked on bed mobility completed independently. Sit to stand with RW with v/cs for safety.pt walked to bath room with RW independently ,toilet transfers with one rail assist ,pt states he has sink next to toilet at home that he can use. Pt worked on donning and doffing of socks and shoes with sock aid and shoe horn ,informed pt to obtain AE from local pharmacy (reacher ,sock aid.shoe horn) demonstrated learning. Suggested walker tray for safety to carry objects. Marland Kitcheneducated pt for safety during ADL and functional transfers demonstrated learning. Educated on simulated car transfers demonstrated learning. Pt is making good progress.    Treatment Activities: ADL training,functional transfers,safety    Educated the patient to role of occupational therapy, plan of care, goals of therapy and safety with mobility and ADLs, discharge instructions, home safety.    Plan:    OT Frequency Recommended: 1-2x/wk     Continue plan of care.       Precautions and Contraindications:   Up as tolerated,fall     Updated Medical Status/Imaging/Labs:  reviewed    Subjective:I am getting out of here.   Patient's medical condition is appropriate for Occupational Therapy intervention at this time.  Patient is agreeable to participation in the therapy session. Nursing clears patient for  therapy.    Pain:   Scale: soreness  Location: left thigh and hip area  Intervention: RN aware    Objective:   Patient is in bed with PICC line in place.compression stocking left LE.,abdominal distension.      Cognition  Alert and oriented followed all directions.    Functional Mobility  Rolling:  I  Supine to Sit: I  Sit to Stand: v/cs with RW  Transfers: v/cs with RW    Balance  Static Sitting: good  Dynamic Sitting: good  Static Standing: good with RW  Dynamic Standing: good- with RW    Self Care and Home Management  Eating: I  Grooming: i  Bathing: minA for LB  UE Dressing: I  LE Dressing: mod A for left LE  Toileting:  I    Therapeutic Exercises  With activity     Participation: good  Endurance: good    Patient left with call bell within reach, all needs met, SCDs not at bed side RN aware, fall mat in place, bed alarm on, chair alarm NA and all questions answered. RN notified of session outcome and patient response.     Goals:  Time For Goal Achievement: 5 visits  ADL Goals  Patient will groom self: Independent, 5 visits, at sinkside, without AE  Patient will dress upper body: Supervision, 5 visits, without AE  Mobility and Transfer Goals  Pt will perform functional transfers: Supervision, 5 visits, with rolling walker  Neuro Re-Ed Goals  Pt will perform dynamic sitting  balance: Goal met, Independent  Musculoskeletal Goals  Pt will demonstrate increase of strength: Goal met  Pt will perform fine motor coordination tasks: independent, 5 visits, to increase ability to complete ADLs, w/ home exercise program, feeding, grooming, fasteners  Executive Fucntion Goals  Pt will demonstrate memory/orientation: Goal met  Pt will demonstrate safety: independent, 5 visits, to increase ability to complete ADLs, instituting safety techniques  Pt will demonstrate attention to task: independent, 5 visits, to increase ability to complete ADLs                Ander Purpura, MA/OTR, CSRS, LSVT-BIG  [phone]    Time of Treatment  OT  Received On: 03/10/15  Start Time: 0930  Stop Time: 1015  Time Calculation (min): 45 min    Treatment # 3of 5 visits

## 2015-03-10 NOTE — Progress Notes (Signed)
[facility]    An appointment has been scheduled for you at the  [facility] for:      Thursday, June 23 at 2:30PM    At Location    [address] [place]  [address]  [place], [place] [address]  [phone]    Please bring the following with you to your appointment:  ? Medications in their original bottles  ? Glucometer/blood sugar log (if diabetic)  ? Weight log (if heart failure)  ? Proof of income (to enroll in medication assistance programs-last tax return or last 2 months of pay stubs)    If this is your initial appointment, please come 15 minutes prior to scheduled appointment time to complete initial appointment forms.

## 2015-03-10 NOTE — Plan of Care (Signed)
Problem: Safety  Goal: Patient will be free from injury during hospitalization  Outcome: Progressing  Pt is A&O x4, denies pain/n/v, was on chair at the beginning of the shift, assisted to transfer to bed, urinate dark tea color urine, had BM x1, bed alarm on, floor mat at bedside, call bell within reach, refused seizure precaution pad, refused SCD sometimes and allow [place] to put on. Will continue with POC.

## 2015-03-10 NOTE — Progress Notes (Signed)
A/Ox4, forgetful at times. VSS. C/o pain to abdomen. Prescriptions filled by Outpatient pharmacy. Tramadol hard copy given to pt. Discharge paperwork reviewed with pt. Pt verbalized understanding. FWW found by SW given to pt prior to [place]. Midline and PIV [place].d. Safety measures in place. Transport home by family friend. Belongings gathered and sent with pt.

## 2015-03-12 ENCOUNTER — Ambulatory Visit (INDEPENDENT_AMBULATORY_CARE_PROVIDER_SITE_OTHER): Payer: Charity | Admitting: "Endocrinology

## 2015-03-12 ENCOUNTER — Telehealth (INDEPENDENT_AMBULATORY_CARE_PROVIDER_SITE_OTHER): Payer: Self-pay

## 2015-03-12 NOTE — Telephone Encounter (Signed)
Pt. was called for missing appt. Leave a voice message to call back and re-schedule another appointment with TS.

## 2015-03-24 ENCOUNTER — Other Ambulatory Visit (INDEPENDENT_AMBULATORY_CARE_PROVIDER_SITE_OTHER): Payer: Self-pay | Admitting: Hospitalist

## 2015-03-24 ENCOUNTER — Encounter: Payer: Self-pay | Admitting: Family

## 2015-04-18 ENCOUNTER — Inpatient Hospital Stay: Payer: Charity | Admitting: Hospitalist

## 2015-04-18 ENCOUNTER — Inpatient Hospital Stay
Admission: AD | Admit: 2015-04-18 | Discharge: 2015-04-20 | DRG: 434 | Disposition: A | Discharge status: Home / Self Care | Payer: Charity | Source: Other Acute Inpatient Hospital | Attending: Internal Medicine | Admitting: Internal Medicine

## 2015-04-18 DIAGNOSIS — K7011 Alcoholic hepatitis with ascites: Secondary | ICD-10-CM | POA: Diagnosis present

## 2015-04-18 DIAGNOSIS — Z833 Family history of diabetes mellitus: Secondary | ICD-10-CM

## 2015-04-18 DIAGNOSIS — K4090 Unilateral inguinal hernia, without obstruction or gangrene, not specified as recurrent: Secondary | ICD-10-CM | POA: Diagnosis present

## 2015-04-18 DIAGNOSIS — B1920 Unspecified viral hepatitis C without hepatic coma: Secondary | ICD-10-CM | POA: Diagnosis present

## 2015-04-18 DIAGNOSIS — F17210 Nicotine dependence, cigarettes, uncomplicated: Secondary | ICD-10-CM | POA: Diagnosis present

## 2015-04-18 DIAGNOSIS — F1020 Alcohol dependence, uncomplicated: Secondary | ICD-10-CM | POA: Diagnosis present

## 2015-04-18 DIAGNOSIS — K7290 Hepatic failure, unspecified without coma: Secondary | ICD-10-CM | POA: Diagnosis present

## 2015-04-18 DIAGNOSIS — Z880 Allergy status to penicillin: Secondary | ICD-10-CM

## 2015-04-18 DIAGNOSIS — Z823 Family history of stroke: Secondary | ICD-10-CM

## 2015-04-18 DIAGNOSIS — K7031 Alcoholic cirrhosis of liver with ascites: Principal | ICD-10-CM | POA: Diagnosis present

## 2015-04-18 LAB — TYPE AND SCREEN
AB Screen Gel: NEGATIVE
ABO Rh: O POS

## 2015-04-18 LAB — URINALYSIS, REFLEX TO MICROSCOPIC EXAM IF INDICATED
Blood, UA: NEGATIVE
Glucose, UA: NEGATIVE
Ketones UA: NEGATIVE
Leukocyte Esterase, UA: NEGATIVE
Nitrite, UA: NEGATIVE
Protein, UR: 30 — AB
Specific Gravity UA: 1.026 (ref 1.001–1.035)
Urine pH: 6 (ref 5.0–8.0)
Urobilinogen, UA: 4 mg/dL (ref 0.2–2.0)

## 2015-04-18 LAB — RAPID DRUG SCREEN, URINE
Barbiturate Screen, UR: NEGATIVE
Benzodiazepine Screen, UR: NEGATIVE
Cannabinoid Screen, UR: NEGATIVE
Cocaine, UR: NEGATIVE
Opiate Screen, UR: POSITIVE — AB
PCP Screen, UR: NEGATIVE
Urine Amphetamine Screen: NEGATIVE

## 2015-04-18 LAB — CBC
Hematocrit: 31.9 % — ABNORMAL LOW (ref 42.0–52.0)
Hgb: 11.7 g/dL — ABNORMAL LOW (ref 13.0–17.0)
MCH: 39.5 pg — ABNORMAL HIGH (ref 28.0–32.0)
MCHC: 36.7 g/dL — ABNORMAL HIGH (ref 32.0–36.0)
MCV: 107.8 fL — ABNORMAL HIGH (ref 80.0–100.0)
MPV: 11.1 fL (ref 9.4–12.3)
Nucleated RBC: 1 /100 WBC (ref 0–1)
Platelets: 114 10*3/uL — ABNORMAL LOW (ref 140–400)
RBC: 2.96 10*6/uL — ABNORMAL LOW (ref 4.70–6.00)
RDW: 16 % — ABNORMAL HIGH (ref 12–15)
WBC: 8.81 10*3/uL (ref 3.50–10.80)

## 2015-04-18 LAB — COMPREHENSIVE METABOLIC PANEL
ALT: 49 U/L (ref 0–55)
AST (SGOT): 88 U/L — ABNORMAL HIGH (ref 5–34)
Albumin/Globulin Ratio: 0.4 — ABNORMAL LOW (ref 0.9–2.2)
Albumin: 2.2 g/dL — ABNORMAL LOW (ref 3.5–5.0)
Alkaline Phosphatase: 133 U/L — ABNORMAL HIGH (ref 38–106)
BUN: 22 mg/dL (ref 9.0–28.0)
Bilirubin, Total: 9.5 mg/dL — ABNORMAL HIGH (ref 0.2–1.2)
CO2: 19 mEq/L — ABNORMAL LOW (ref 22–29)
Calcium: 8.6 mg/dL (ref 8.5–10.5)
Chloride: 99 mEq/L — ABNORMAL LOW (ref 100–111)
Creatinine: 0.9 mg/dL (ref 0.7–1.3)
Globulin: 4.9 g/dL — ABNORMAL HIGH (ref 2.0–3.6)
Glucose: 106 mg/dL — ABNORMAL HIGH (ref 70–100)
Potassium: 4.2 mEq/L (ref 3.5–5.1)
Protein, Total: 7.1 g/dL (ref 6.0–8.3)
Sodium: 127 mEq/L — ABNORMAL LOW (ref 136–145)

## 2015-04-18 LAB — PT/INR
PT INR: 2 — ABNORMAL HIGH (ref 0.9–1.1)
PT: 23.1 s — ABNORMAL HIGH (ref 12.6–15.0)

## 2015-04-18 LAB — SALICYLATE LEVEL: Salicylate Level: <5 mg/dL — ABNORMAL LOW (ref 15.0–30.0)

## 2015-04-18 LAB — APTT: PTT: 42 s — ABNORMAL HIGH (ref 23–37)

## 2015-04-18 LAB — ACETAMINOPHEN LEVEL: Acetaminophen Level: <6 ug/mL — ABNORMAL LOW (ref 10–30)

## 2015-04-18 LAB — GFR: EGFR: >60

## 2015-04-18 LAB — BODY FLUID PH

## 2015-04-18 MED ORDER — POTASSIUM CHLORIDE CRYS ER 20 MEQ PO TBCR
20.0000 meq | EXTENDED_RELEASE_TABLET | Freq: Every day | ORAL | Status: DC
Start: 2015-04-18 — End: 2015-04-20
  Administered 2015-04-19: 20 meq via ORAL
  Filled 2015-04-18 (×2): qty 1

## 2015-04-18 MED ORDER — FOLIC ACID 1 MG PO TABS
1.0000 mg | ORAL_TABLET | Freq: Every day | ORAL | Status: DC
Start: 2015-04-18 — End: 2015-04-20
  Administered 2015-04-19 – 2015-04-20 (×2): 1 mg via ORAL
  Filled 2015-04-18 (×3): qty 1

## 2015-04-18 MED ORDER — FUROSEMIDE 20 MG PO TABS
20.0000 mg | ORAL_TABLET | Freq: Every day | ORAL | Status: DC
Start: 2015-04-18 — End: 2015-04-20
  Administered 2015-04-19 – 2015-04-20 (×2): 20 mg via ORAL
  Filled 2015-04-18 (×3): qty 1

## 2015-04-18 MED ORDER — NALOXONE HCL 0.4 MG/ML IJ SOLN
0.2000 mg | INTRAMUSCULAR | Status: DC | PRN
Start: 2015-04-18 — End: 2015-04-20

## 2015-04-18 MED ORDER — SPIRONOLACTONE 25 MG PO TABS
100.0000 mg | ORAL_TABLET | Freq: Every day | ORAL | Status: DC
Start: 2015-04-18 — End: 2015-04-20
  Administered 2015-04-19 – 2015-04-20 (×2): 100 mg via ORAL
  Filled 2015-04-18 (×3): qty 4

## 2015-04-18 MED ORDER — OXYCODONE HCL 5 MG PO TABS
5.0000 mg | ORAL_TABLET | Freq: Four times a day (QID) | ORAL | Status: DC | PRN
Start: 2015-04-18 — End: 2015-04-19
  Administered 2015-04-18 – 2015-04-19 (×2): 5 mg via ORAL
  Filled 2015-04-18 (×2): qty 1

## 2015-04-18 MED ORDER — LACTULOSE 10 GM/15ML PO SOLN
20.0000 g | Freq: Two times a day (BID) | ORAL | Status: DC
Start: 2015-04-18 — End: 2015-04-20
  Administered 2015-04-18 – 2015-04-20 (×4): 20 g via ORAL
  Filled 2015-04-18 (×4): qty 30

## 2015-04-18 MED ORDER — ALBUMIN HUMAN 25 % IV SOLN
25.0000 g | Freq: Once | INTRAVENOUS | Status: AC
Start: 2015-04-18 — End: 2015-04-18
  Administered 2015-04-18: 25 g via INTRAVENOUS
  Filled 2015-04-18: qty 100

## 2015-04-18 NOTE — H&P (Signed)
ADMISSION HISTORY AND PHYSICAL EXAM    Date Time: 04/18/2015 6:26 PM  Patient Name: Gregory [name]  Attending Physician: Lucilla Edin, MD  Primary Care Physician: Christa See, MD    CC: abdominal pain      Assessment:   Active Problems:    Ascites due to alcoholic cirrhosis    Mr. Manninen is a 39 yo male with hx of alcoholic cirrhosis and hepatitis and reducible left inguinal hernia who presented to hospital as direct admit from [facility] for ascites secondary to alcoholic cirrhosis and evaluation for TIPS procedure. Pt is currently hemodynamically stable.     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  Requiring serial paracentesis (in 1wk had total of 7L removed)  Pt is in process for transitioning care to [facility] for possible transplant  MELD score of 23  MELD-Na: 28  Requiring serial paracentesis (2 x in past week)   - paracentesis w/ [facility], send fluids for studies (LDH, glucose, body fluid count)  - continue home meds (lasix, aldactone, lactulose)  - f/u labs  - IR consult for evaluation for TIPS procedure   - CBC    Hx of Alcoholic hepatitis  Last admission 6/5-6/22 at [facility] for alcoholic hepatitis   Maddrey score: 47  - CMP  - ApTT and PT/INR  - acetaminophen level and salicylate level  - Drug screen     Generalized abdominal pain   Pt takes oxycodone HCl 5mg  immediate release at home    Global  F - none  E - replete PRN  N - NPO at midnight     DVT PPX  none    Disposition:     Today's date: 04/18/2015  Admit Date: 04/18/2015  5:03 PM  Anticipated medical stability for discharge:Red - not tomorrow - estimated month/date: 8/1  Service status: Inpatient: risk of morbidity and mortality  Reason for ongoing hospitalization: possible TIPS procedure  Anticipated discharge needs: tbd     History of Presenting Illness:   Gregory [name] is a 39 y.o. male w/ hx of alcohol abuse, alcoholic cirrhosis and hepatits who presents to the hospital for direct admission from [facility] for ascites and  evaluation of TIPS procedure. Pt was last admitted at [facility] 6/5-6/22 for alcoholic hepatitis and cirrhosis. Since discharge pt has had to have serial paracentesis for ascites. In the last week pt had 2 paracentesis w/ total fluid removal of 7L (4L on 7/23 and 7/26 3L). Pt endorses diffuse abdominal pain, increasingly SOB due to fluid accumulation and swelling near inguinal hernia. Pt states that he has had intermittent fevers up to 100.6, intermittent nausea and dysuria. He denies CP, emesis, constipation/diarrhea, or hematuria. He states last drink was 57 days ago.     Past Medical History:     Past Medical History   Diagnosis Date   . Thyroid disease    . Cirrhosis        Available old records reviewed, including:  Care everywhere    Past Surgical History:     Past Surgical History   Procedure Laterality Date   . Appendectomy     . Knee surgery       Hardware placement R knee       Family History:   MGM - ESRD on HD, heart disease - deceased  Mother - alcoholism, stroke  Maternal aunt - heart disease and stroke  No family hx of cancer, diabetes    Social History:   Pt smokes 2 cigarettes  a day since June/2016. Prior pt had smoked 1/2 pack per day.     History   Smoking status   . Former Smoker -- 0.50 packs/day   . Types: Cigarettes   . Quit date: 01/22/2015   Smokeless tobacco   . Never Used     History   Alcohol Use   . 25.2 oz/week   . 42 Cans of beer per week     Comment: daily, 6 pack beer q day     Pt states last alcoholic drink was 57 days ago.       History   Drug Use No   denies other drug use.     Allergies:     Allergies   Allergen Reactions   . Penicillins Hives     Tolerates ceftriaxone.        Medications:     Prior to Admission medications    Medication Sig Start Date End Date Taking? Authorizing Provider   folic acid (FOLVITE) 1 MG tablet Take 1 tablet (1 mg total) by mouth daily. 03/08/15   Anselm Pancoast, MD   furosemide (LASIX) 20 MG tablet Take 1 tablet (20 mg total) by mouth daily. 03/10/15    Anselm Pancoast, MD   lactulose (CHRONULAC) 10 GM/15ML solution Take 30 mLs (20 g total) by mouth 2 (two) times daily. Adjust the amount so that you have 2-3 bowel movements per day 03/09/15   Anselm Pancoast, MD   potassium chloride (K-DUR,KLOR-CON) 20 MEQ tablet Take 1 tablet (20 mEq total) by mouth daily. 03/10/15   Anselm Pancoast, MD   spironolactone (ALDACTONE) 100 MG tablet Take 1 tablet (100 mg total) by mouth daily. 03/10/15   Anselm Pancoast, MD   traMADol (ULTRAM) 50 MG tablet Take 1 tablet (50 mg total) by mouth every 8 (eight) hours as needed. 03/09/15   Anselm Pancoast, MD         Method by which medications were confirmed on admission: pt brought in print out list     Review of Systems:   All other systems were reviewed per HPI    Physical Exam:   Patient Vitals for the past 24 hrs:   BP Temp Temp src Pulse Resp SpO2 Height Weight   04/18/15 1700 159/85 mmHg (!) 96.6 F (35.9 C) Oral 83 16 99 % 1.93 m (6\' 4" ) 81.64 kg (179 lb 15.7 oz)     Body mass index is 21.92 kg/(m^2).  No intake or output data in the 24 hours ending 04/18/15 1826    General: awake, alert, oriented x 3; mild distress. Jaundice  HEENT: perrla, eomi, sclera icterus, oropharynx clear without lesions, mucous membranes slightly dry  Neck: supple, no lymphadenopathy, no thyromegaly, no JVD  Cardiovascular: regular rate and rhythm, no murmurs, rubs or gallops  Lungs: Rhonchi auscultated in left lung field, no wheezes crackles in remaining lung fields. Right lung clear to ascultation.  Abdomen: firm, diffuse tenderness with light palpation, distended; normoactive bowel sounds, no rebound or guarding. left inguinal reducible, swelling in groin associated with pain w/ palpation.   Extremities: Edema in lower extremities L>R no clubbing, cyanosis,   Neuro: cranial nerves grossly intact, grip strength 5/5, sensation intact, intermittent upper extremity tremor. Asterixis noted  Skin: small petehcial patch on left dorsal foot        Labs:      Results     Procedure Component Value Units Date/Time    Prothrombin time/INR [[id number]]  (  Abnormal) Collected:  04/18/15 1917    Specimen Information:  Blood Updated:  04/18/15 2008     PT 23.1 (H) sec      PT INR 2.0 (H)      PT Anticoag. Given Within 48 hrs. None     APTT [[id number]]  (Abnormal) Collected:  04/18/15 1917     PTT 42 (H) sec Updated:  04/18/15 2008    Salicylate level [[id number]]  (Abnormal) Collected:  04/18/15 1917    Specimen Information:  Blood Updated:  04/18/15 2003     Salicylate Level <5.0 (L) mg/dL     Acetaminophen level [[id number]]  (Abnormal) Collected:  04/18/15 1917    Specimen Information:  Blood Updated:  04/18/15 2003     Acetaminophen Level <6 (L) ug/mL     Comprehensive metabolic panel [[id number]]  (Abnormal) Collected:  04/18/15 1917    Specimen Information:  Blood Updated:  04/18/15 2003     Glucose 106 (H) mg/dL      BUN 62.9 mg/dL      Creatinine 0.9 mg/dL      Sodium 528 (L) mEq/L      Potassium 4.2 mEq/L      Chloride 99 (L) mEq/L      CO2 19 (L) mEq/L      Calcium 8.6 mg/dL      Protein, Total 7.1 g/dL      Albumin 2.2 (L) g/dL      AST (SGOT) 88 (H) U/L      ALT 49 U/L      Alkaline Phosphatase 133 (H) U/L      Bilirubin, Total 9.5 (H) mg/dL      Globulin 4.9 (H) g/dL      Albumin/Globulin Ratio 0.4 (L)     GFR [[id number]] Collected:  04/18/15 1917     EGFR >60.0 Updated:  04/18/15 2003    Type and Screen [[id number]] Collected:  04/18/15 1929    Specimen Information:  Blood Updated:  04/18/15 1955    CBC without differential [[id number]]  (Abnormal) Collected:  04/18/15 1917    Specimen Information:  Blood from Blood Updated:  04/18/15 1947     WBC 8.81 x10 3/uL      Hgb 11.7 (L) g/dL      Hematocrit 63.8 (L) %      Platelets 114 (L) x10 3/uL      RBC 2.96 (L) x10 6/uL      MCV 107.8 (H) fL      MCH 39.5 (H) pg      MCHC 36.7 (H) g/dL      RDW 16 (H) %      MPV 11.1 fL      Nucleated RBC 1 /100 WBC     CULTURE BLOOD AEROBIC AND ANAEROBIC [[id number]] Collected:   04/18/15 1929    Specimen Information:  Blood, Venipuncture Updated:  04/18/15 1929    Narrative:      1 BLUE+1 PURPLE    Albumin, Body Fluid [[id number]] Resulted:  04/18/15 1858    Specimen Information:  Body Fluid from Ascites Fluid Updated:  04/18/15 1858     Body Fluid Source: Ascites Fluid     Body Fluid pH [[id number]] Resulted:  04/18/15 1858    Specimen Information:  Ascites Fluid Updated:  04/18/15 1858     Body Fluid Source: Ascites Fluid           Imaging personally reviewed, including:  No results found.    Safety Checklist  DVT  prophylaxis:  CHEST guideline (See page [id number]) Mechanical   Foley:   Rn Foley protocol Not present   IVs:  Peripheral IV   PT/OT: Ordered   Daily CBC & or Chem ordered:  SHM/ABIM guidelines (see #5) Yes, due to clinical and lab instability   Reference for approximate charges of common labs: CBC auto diff - $76  BMP - $99  Mg - $79         Signed by: Robyne Askew, MD                     Family Medicine PGY-1  cc:Pcp, Octaviano Glow, MD

## 2015-04-18 NOTE — Progress Notes (Signed)
Attending Attestation:     I have seen and personally examined the patient.  I agree with the findings and exam as documented by Dr. Adriana Mccallum.    Plan:    Alcoholic failure, cirrhosis.  Continue with supportive care.  Continue  with Lasix, Aldactone, and fluid restriction.     Abdominal ascites.  The patient reports that he is going for paracentesis  of at least twice a week and it is not manageable with medication.  We will  consult interventional radiology in a.m. for TIPS evaluation.     History of alcohol abuse.  The patient reports that he has not had any  alcoholic beverages in the past 2 months.  Currently, patient seems  hemodynamically stable, no evidence of intoxication or withdrawal.  We will  continue to monitor.     Laboratory evaluation is pending.  Once the results are available, will  evaluate and discuss the treatment options with the patient.  Paracenteses  is scheduled for a.m.  N.p.o. post-midnight.  Gastrointestinal and deep  venous thrombosis prophylaxis as needed.        Disposition:     Today's date: 04/18/2015  Admit Date: 04/18/2015  5:03 PM  Anticipated medical stability for discharge:Red - not tomorrow - estimated month/date: 04-21-2015  Service status: Inpatient: risk of morbidity and mortality, risk of progressive disease and risk of readmission  Reason for ongoing hospitalization: ascites  Anticipated discharge needs: tbd     Lucilla Edin, MD

## 2015-04-19 LAB — BODY FLUID CELL COUNT
Body Fluid Lymphocytes: 48 %
Body Fluid Monocyte/Macrophage Cells: 45 %
Body Fluid Polymorphonuclear Cell: 2 %
Body Fluid RBC: 950 /mm3
Body Fluid WBC: 90 /mm3

## 2015-04-19 LAB — LACTATE DEHYDROGENASE, BODY FLUID: Body Fluid LDH: 28 U/L

## 2015-04-19 LAB — GLUCOSE, BODY FLUID: Body Fluid Glucose: 110 mg/dL

## 2015-04-19 LAB — ALBUMIN,BODY FLUID: Body Fluid Albumin: <0.4 g/dL

## 2015-04-19 MED ORDER — FENTANYL 10 MCG/ML NS 100 ML (OUTSOURCED)
12.5000 ug/h | INTRAVENOUS | Status: DC
Start: 2015-04-19 — End: 2015-04-19

## 2015-04-19 MED ORDER — OXYCODONE HCL 5 MG PO TABS
10.0000 mg | ORAL_TABLET | Freq: Four times a day (QID) | ORAL | Status: DC | PRN
Start: 2015-04-19 — End: 2015-04-20
  Administered 2015-04-19 – 2015-04-20 (×3): 10 mg via ORAL
  Filled 2015-04-19 (×3): qty 2

## 2015-04-19 NOTE — Consults (Signed)
VIR CONSULT    Date Time: 04/19/2015 10:28 AM  Patient Name: Gregory [name]  Attending Physician: Lucilla Edin, MD    Assessment:   1. Alcoholic cirrhosis, liver failure, intractable ascites    2. MELD score 29 (Cr. 0.9, Tbili 9.5, INR 2.0, Na 127).    Plan:   1. Recommend maximal diuretic therapy for control of his ascites. Paracenteses PRN    2. Given his MELD score, TIPS is not recommended given the high morbidity and TIPS may worsen his liver failure leading to death. If his liver function were to improve then we will reconsider TIPS. Will arrange for outpatient f/u in 1 month.    3. Patient is scheduled for a liver transplant evaluation at [facility]. He understands the importance of a liver transplant and necessity to abstain from alcohol use.    History of Presenting Illness:   Gregory [name] is a 39 y.o. male who presents to the hospital with worsening ascites over the past several months. Initially, he required paracentesis once a month and progressed to once a week and now 2-3 times per week. Largest volume of ascitic fluid removed was 7 liters in 1 week. No report of hematemesis. He states that his last drink was 57 days ago and he is scheduled for a liver transplant evaluation at [facility].    Past Medical History:     Past Medical History   Diagnosis Date   . Thyroid disease    . Cirrhosis        Past Surgical History:     Past Surgical History   Procedure Laterality Date   . Appendectomy     . Knee surgery       Hardware placement R knee       Family History:     Family History   Problem Relation Age of Onset   . Diabetes Mother    . Diabetes Father    . Diabetes Maternal Grandmother    . Diabetes Maternal Grandfather    . Diabetes Paternal Grandmother    . Diabetes Paternal Grandfather        Social History:     Social History     Social History   . Marital Status: Married     Spouse Name: N/A   . Number of Children: N/A   . Years of Education: N/A     Social History Main Topics   . Smoking status: Former  Smoker -- 0.50 packs/day     Types: Cigarettes     Quit date: 01/22/2015   . Smokeless tobacco: Never Used   . Alcohol Use: 25.2 oz/week     42 Cans of beer per week      Comment: daily, 6 pack beer q day   . Drug Use: No   . Sexual Activity: Not on file     Other Topics Concern   . Not on file     Social History Narrative       Allergies:     Allergies   Allergen Reactions   . Penicillins Hives     Tolerates ceftriaxone.        Medications:     Prescriptions prior to admission   Medication Sig   . folic acid (FOLVITE) 1 MG tablet Take 1 tablet (1 mg total) by mouth daily.   . furosemide (LASIX) 20 MG tablet Take 1 tablet (20 mg total) by mouth daily.   [name] lactulose (CHRONULAC) 10 GM/15ML solution Take 30  mLs (20 g total) by mouth 2 (two) times daily. Adjust the amount so that you have 2-3 bowel movements per day   . oxyCODONE (ROXICODONE) 5 MG immediate release tablet Take 5 mg by mouth every 6 (six) hours as needed for Pain.   . potassium chloride (K-DUR,KLOR-CON) 20 MEQ tablet Take 1 tablet (20 mEq total) by mouth daily.   [name] spironolactone (ALDACTONE) 100 MG tablet Take 1 tablet (100 mg total) by mouth daily.   . traMADol (ULTRAM) 50 MG tablet Take 1 tablet (50 mg total) by mouth every 8 (eight) hours as needed.         Physical Exam:     Filed Vitals:    04/19/15 0722   BP: 110/65   Pulse: 98   Temp: 99.9 F (37.7 C)   Resp: 16   SpO2: 95%     Gen: AAOx3, jaundiced, no acute distress  ABD: soft, distended, +fluid wave, mild diffuse tenderness  Ext: mildly edematous      Labs:     Recent Labs  Lab 04/18/15  1917   WBC 8.81   RBC 2.96*   HGB 11.7*   HEMATOCRIT 31.9*       Recent Labs  Lab 04/18/15  1917   GLUCOSE 106*   BUN 22.0   CREATININE 0.9   SODIUM 127*   CO2 19*   ALBUMIN 2.2*   AST (SGOT) 88*   ALT 49       Recent Labs  Lab 04/18/15  1917   PT INR 2.0*   PTT 42*           Rads:     Radiology Results (24 Hour)     ** No results found for the last 24 hours. **            Signed by: Rex Kras, MD    [facility]   [facility]  [place] spectralink: [phone]   After hours office ph: [phone], fax: [phone]

## 2015-04-19 NOTE — Plan of Care (Signed)
Problem: Safety  Goal: Patient will be free from injury during hospitalization  Intervention: Assess patient's risk for falls and implement fall prevention plan of care per policy  Pt is alert and oreintedx3, pt's pain wise received 10mg  po oxycodone relieve noted. Pt had paracentesis tolerated the procedure well, vss, no status changed, call bell in reach, cont to monitor the pt.

## 2015-04-19 NOTE — Progress Notes (Signed)
MEDICINE PROGRESS NOTE    Date Time: 04/19/2015 12:54 PM  Patient Name: Gregory [name]  Attending Physician: Lucilla Edin, MD    Assessment:   Active Problems:    Ascites due to alcoholic cirrhosis  Resolved Problems:    * No resolved hospital problems. *      Mr. Askren is a 39 yo male with hx of alcoholic cirrhosis and hepatitis and reducible left inguinal hernia who presented to hospital as direct admit from [facility] for ascites secondary to alcoholic cirrhosis and evaluation for TIPS procedure. Pt is currently hemodynamically stable.     Plan:     # Ascites/Cirrhosis  - Alcoholic cirrhosis + Hep C infection  - MELD score of 23, Maddrey score 48  - Paracentesis performed, samples sent for cell count, culture. 5300 ml serous fluid removed  - IR recommendations: Paracentesis, maximal diuretic therapy, given the MELD score, TIPS is not recommended.  - Patient is scheduled for a liver transplant evaluation at [facility].  - Currently on lasix, aldactone and fluid restriction.  - Oxycodone dose increased to 10 mg Q6hrs    # Alcohol abuse  - He reported he did not consume ETOH for 2 months.  - No sign of intoxication or withdrawal    Safety Checklist:     DVT prophylaxis:  CHEST guideline (See page [id number]) Mechanical   Foley:  [place] Rn Foley protocol Not present   IVs:  Peripheral IV   PT/OT: Not needed   Daily CBC & or Chem ordered:  SHM/ABIM guidelines (see #5) Yes, due to clinical and lab instability   Reference for approximate charges of common labs: CBC auto diff - $76  BMP - $99  Mg - $79    Lines:     Patient Lines/Drains/Airways Status    Active PICC Line / CVC Line / PIV Line / Drain / Airway / Intraosseous Line / Epidural Line / ART Line / Line / Wound / Pressure Ulcer / NG/OG Tube     Name:   Placement date:   Placement time:   Site:   Days:    External Urinary Catheter  02/27/15   0350      51                 Disposition:     Today's date: 04/19/2015  Admit Date: 04/18/2015  5:03 PM  LOS: 1  Anticipated  medical stability for discharge:Red - not tomorrow - estimated month/date: 04/21/2015  Service status: Observation:patient is stable and improving.  Reason for ongoing hospitalization: liver failure  Anticipated discharge needs: improved hepatic functions      Subjective     CC: ascites, hepatic failure    Interval History/24 hour events: Paracentesis performed, Meropenem [place]'ed    HPI/Subjective: Hemodynamically stable     Review of Systems:     No chest pain, shortness of breath, headache. Abdominal distension +, sclera icteric, lower extremity edema +    Physical Exam:     VITAL SIGNS PHYSICAL EXAM   Temp:  [96.6 F (35.9 C)-99.9 F (37.7 C)] 99.9 F (37.7 C)  Heart Rate:  [83-105] 105  Resp Rate:  [15-16] 16  BP: (106-159)/(65-88) 119/71 mmHg    No intake or output data in the 24 hours ending 04/19/15 1254 Physical Exam  General: awake, alert X 3  Cardiovascular: regular rate and rhythm, no murmurs, rubs or gallops  Lungs: clear to auscultation bilaterally, without wheezing, rhonchi, or rales  Abdomen: distended, non tender, +  bowel sounds,   Extremities: Lower extremity edema +  Other: Icteric sclera +       Meds:     Medications were reviewed:    Labs:     Labs (last 72 hours):      Recent Labs  Lab 04/18/15  1917   WBC 8.81   HGB 11.7*   HEMATOCRIT 31.9*   PLATELETS 114*         Recent Labs  Lab 04/18/15  1917   PT 23.1*   PT INR 2.0*   PTT 42*      Recent Labs  Lab 04/18/15  1917   SODIUM 127*   POTASSIUM 4.2   CHLORIDE 99*   CO2 19*   BUN 22.0   CREATININE 0.9   CALCIUM 8.6   ALBUMIN 2.2*   PROTEIN, TOTAL 7.1   BILIRUBIN, TOTAL 9.5*   ALKALINE PHOSPHATASE 133*   ALT 49   AST (SGOT) 88*   GLUCOSE 106*                   Microbiology, reviewed and are significant for:  Blood cx (07/30) : pending  Ascites fluid cx (07/31) : pending    Imaging, reviewed and are significant for:        Signed by: Jannett Celestine, MD

## 2015-04-19 NOTE — Progress Notes (Signed)
Attending Attestation:     I have seen and personally examined the patient.  I agree with the findings and exam as documented by Dr. Diana Eves.    Plan:    Recurrent ascites secondary to alcoholic cirrhosis and hepatitis C  infection.  Continue with Aldactone, Lasix.  Interventional radiology  evaluation is appreciated.  At this point, patient is not a candidate for  TIPS.     Schedule for paracentesis.  If there is no evidence of spontaneous  bacterial peritonitis, the patient would be discharged home to continue  with medical management and periodic paracentesis and possible transplant  evaluation as an outpatient.       Case discussed with patient as well as interventional radiology.  Time  spent is 35 minutes, greater than 50% in counseling and coordination of  care.        Disposition:     Today's date: 04/19/2015  Admit Date: 04/18/2015  5:03 PM  Anticipated medical stability for discharge:Yellow - maybe tomorrow  Service status: Inpatient: risk of morbidity and mortality, risk of progressive disease and risk of readmission  Reason for ongoing hospitalization: ascites  Anticipated discharge needs: tbd     Lucilla Edin, MD

## 2015-04-19 NOTE — Plan of Care (Signed)
Problem: Health Promotion  Goal: Knowledge - disease process  Extent of understanding conveyed about a specific disease process.   Outcome: Progressing  Patient AOx4, VSS, no SOB or CP noted. NPO after midnight. No BM this shift.    Problem: Safety  Goal: Patient will be free from injury during hospitalization  Outcome: Progressing  Fall precautions observed. Bed alarm, floor mats and 3/4 side rails in place. Call bell w/in reach. Hourly rounding done. Will continue to monitor.    Problem: Pain  Goal: Patient's pain/discomfort is manageable  Outcome: Progressing  Pt c/o abdominal pain, oxycodone given with good effect.

## 2015-04-19 NOTE — Procedures (Signed)
Gregory [name] is a 39 y.o. male patient.  Active Problems:    Ascites due to alcoholic cirrhosis    Past Medical History   Diagnosis Date   . Thyroid disease    . Cirrhosis      Blood pressure 119/71, pulse 105, temperature 99.9 F (37.7 C), temperature source Oral, resp. rate 16, height 1.93 m (6\' 4" ), weight 81.64 kg (179 lb 15.7 oz), SpO2 95 %.    Paracentesis  Date/Time: 04/19/2015 12:15 PM  Performed by: Maida Sale  Authorized by: Charleston Poot A  Consent: Verbal consent obtained. Written consent obtained.  Risks and benefits: risks, benefits and alternatives were discussed  Consent given by: patient  Patient understanding: patient states understanding of the procedure being performed  Patient consent: the patient's understanding of the procedure matches consent given  Procedure consent: procedure consent matches procedure scheduled  Relevant documents: relevant documents present and verified  Test results: test results available and properly labeled  Site marked: the operative site was marked  Imaging studies: imaging studies available  Required items: required blood products, implants, devices, and special equipment available  Patient identity confirmed: verbally with patient, arm band, provided demographic data and hospital-assigned identification number  Time out: Immediately prior to procedure a "time out" was called to verify the correct patient, procedure, equipment, support staff and site/side marked as required.  Initial or subsequent exam: subsequent  Procedure purpose: diagnostic and therapeutic  Indications: abdominal discomfort secondary to ascites  Anesthesia: local infiltration  Local anesthetic: lidocaine 1% without epinephrine  Anesthetic total: 7 ml  Preparation: Patient was prepped and draped in the usual sterile fashion.  Needle gauge: 19g yueh.  Ultrasound guidance: yes  Puncture site: right lower quadrant  Fluid removed: 5300(ml)  Fluid appearance: serous  Dressing: 4x4  sterile gauze and pressure dressing  Patient tolerance: Patient tolerated the procedure well with no immediate complications  Comments: Samples sent to lab for analysis.        Maida Sale., MPAS PA-C  04/19/2015

## 2015-04-20 DIAGNOSIS — R188 Other ascites: Secondary | ICD-10-CM

## 2015-04-20 LAB — CBC
Hematocrit: 28.4 % — ABNORMAL LOW (ref 42.0–52.0)
Hgb: 10.2 g/dL — ABNORMAL LOW (ref 13.0–17.0)
MCH: 38.6 pg — ABNORMAL HIGH (ref 28.0–32.0)
MCHC: 35.9 g/dL (ref 32.0–36.0)
MCV: 107.6 fL — ABNORMAL HIGH (ref 80.0–100.0)
MPV: 11.4 fL (ref 9.4–12.3)
Nucleated RBC: 2 /100 WBC — ABNORMAL HIGH (ref 0–1)
Platelets: 92 10*3/uL — ABNORMAL LOW (ref 140–400)
RBC: 2.64 10*6/uL — ABNORMAL LOW (ref 4.70–6.00)
RDW: 16 % — ABNORMAL HIGH (ref 12–15)
WBC: 9.23 10*3/uL (ref 3.50–10.80)

## 2015-04-20 LAB — COMPREHENSIVE METABOLIC PANEL
ALT: 34 U/L (ref 0–55)
AST (SGOT): 73 U/L — ABNORMAL HIGH (ref 5–34)
Albumin/Globulin Ratio: 0.5 — ABNORMAL LOW (ref 0.9–2.2)
Albumin: 2.1 g/dL — ABNORMAL LOW (ref 3.5–5.0)
Alkaline Phosphatase: 112 U/L — ABNORMAL HIGH (ref 38–106)
BUN: 21 mg/dL (ref 9.0–28.0)
Bilirubin, Total: 5.9 mg/dL — ABNORMAL HIGH (ref 0.2–1.2)
CO2: 20 mEq/L — ABNORMAL LOW (ref 22–29)
Calcium: 8.3 mg/dL — ABNORMAL LOW (ref 8.5–10.5)
Chloride: 98 mEq/L — ABNORMAL LOW (ref 100–111)
Creatinine: 0.8 mg/dL (ref 0.7–1.3)
Globulin: 3.9 g/dL — ABNORMAL HIGH (ref 2.0–3.6)
Glucose: 96 mg/dL (ref 70–100)
Potassium: 5.2 mEq/L — ABNORMAL HIGH (ref 3.5–5.1)
Protein, Total: 6 g/dL (ref 6.0–8.3)
Sodium: 124 mEq/L — ABNORMAL LOW (ref 136–145)

## 2015-04-20 LAB — ECG 12-LEAD
Atrial Rate: 78 {beats}/min
P Axis: 56 degrees
P-R Interval: 160 ms
Q-T Interval: 400 ms
QRS Duration: 102 ms
QTC Calculation (Bezet): 456 ms
R Axis: 14 degrees
T Axis: 33 degrees
Ventricular Rate: 78 {beats}/min

## 2015-04-20 LAB — PT/INR
PT INR: 2.4 — ABNORMAL HIGH (ref 0.9–1.1)
PT: 26.5 s — ABNORMAL HIGH (ref 12.6–15.0)

## 2015-04-20 LAB — BODY FLUID PATH REVIEW-

## 2015-04-20 LAB — GFR: EGFR: >60

## 2015-04-20 MED ORDER — LACTULOSE 10 GM/15ML PO SOLN
20.0000 g | Freq: Two times a day (BID) | ORAL | Status: AC
Start: 2015-04-20 — End: 2015-04-27

## 2015-04-20 MED ORDER — OXYCODONE HCL 10 MG PO TABS
10.0000 mg | ORAL_TABLET | Freq: Three times a day (TID) | ORAL | Status: DC | PRN
Start: 2015-04-20 — End: 2015-04-20

## 2015-04-20 MED ORDER — SPIRONOLACTONE 25 MG PO TABS
100.0000 mg | ORAL_TABLET | Freq: Every day | ORAL | Status: AC
Start: 2015-04-20 — End: 2015-04-27

## 2015-04-20 MED ORDER — TRAMADOL HCL 50 MG PO TABS
50.0000 mg | ORAL_TABLET | Freq: Three times a day (TID) | ORAL | Status: AC | PRN
Start: 2015-04-20 — End: 2015-04-27

## 2015-04-20 MED ORDER — OXYCODONE HCL 10 MG PO TABS
10.0000 mg | ORAL_TABLET | Freq: Three times a day (TID) | ORAL | Status: AC | PRN
Start: 2015-04-20 — End: 2015-04-25

## 2015-04-20 MED ORDER — FOLIC ACID 1 MG PO TABS
1.0000 mg | ORAL_TABLET | Freq: Every day | ORAL | Status: AC
Start: 2015-04-20 — End: 2015-04-27

## 2015-04-20 MED ORDER — FUROSEMIDE 20 MG PO TABS
20.0000 mg | ORAL_TABLET | Freq: Every day | ORAL | Status: AC
Start: 2015-04-20 — End: 2015-04-27

## 2015-04-20 NOTE — Plan of Care (Signed)
Patient is being discharged today, he will follow up with Dr. Tonna Boehringer at [facility] 05/21/15 at 11 am with repeat duplex to discuss possible TIPS.    Konrad Penta PA-C

## 2015-04-20 NOTE — Progress Notes (Signed)
Patient states needs prescriptions for all his medications, notified attending team.  He has prescriptions for Oxycodone 10MG .

## 2015-04-20 NOTE — Plan of Care (Signed)
Problem: Safety  Goal: Patient will be free from injury during hospitalization  Outcome: Progressing

## 2015-04-20 NOTE — Plan of Care (Signed)
Problem: Safety  Goal: Patient will be free from injury during hospitalization  Outcome: Progressing    Problem: Pain  Goal: Patient's pain/discomfort is manageable  Outcome: Progressing

## 2015-04-20 NOTE — Progress Notes (Signed)
Patient alert and oriented x4, refused SCDs, education provided, patient verbalized understanding, continue to refused.

## 2015-04-20 NOTE — Progress Notes (Signed)
Attending Attestation:     I have seen and personally examined the patient.  I agree with the findings and exam as documented by Dr. Diana Eves.    Plan:    Recurrent ascites secondary to alcoholic liver disease, status post  paracentesis, 5.3 liters of fluid removed.  The patient has been evaluated  by interventional radiology due to hyperbilirubinemia.  The patient is not  a candidate for TIPS at the current time.  Abdominal ascites shows no  evidence of acute infection.  The patient will be discharged home to follow  up in transitional care clinic within 5 to 7 days, as well as with Dr. Jaynie Collins,  who will evaluate the patient for TIPS once bilirubinemia has improved.   Plan of discharge and followup discussed with patient in detail.     Time spent is 34 minutes.  This time excludes rounding with resident in  teaching sessions.        Disposition:     Today    Lucilla Edin, MD

## 2015-04-20 NOTE — Discharge Summary (Signed)
MEDICINE DISCHARGE SUMMARY    Date Time: 04/20/2015 7:18 PM  Patient Name: Gregory [name]  Attending Physician: No att. providers found  Primary Care Physician: Christa See, MD    Date of Admission: 04/18/2015  Date of Discharge: 04/20/2015    Discharge Diagnoses:     Principal Diagnosis (Diagnosis after study, that is chiefly responsible for admission to inpatient status): Ascites, hepatic cirrhosis      Disposition:      Home with family    Pending Results, Recommendations & Instructions to providers after discharge:     1. Micro / Labs / Path pending:   Unresulted Labs     Procedure . . . Date/Time    Culture, Anaerobic Bacteria [[id number]] Collected:  04/19/15 1140    Specimen Information:  Other from Ascites Fluid Updated:  04/19/15 1529        2. Wound Care Instructions:   3. Date of completion for antibiotics or other medications:   4. Other: Please follow up with Transitional Care center in a week    Recent Labs - Last 2:         Recent Labs  Lab 04/20/15  0546 04/18/15  1917   WBC 9.23 8.81   HGB 10.2* 11.7*   HEMATOCRIT 28.4* 31.9*   PLATELETS 92* 114*         Recent Labs  Lab 04/20/15  0547 04/18/15  1917   PT 26.5* 23.1*   PT INR 2.4* 2.0*   PTT  --  42*       Recent Labs  Lab 04/20/15  0547 04/18/15  1917   ALKALINE PHOSPHATASE 112* 133*   BILIRUBIN, TOTAL 5.9* 9.5*   PROTEIN, TOTAL 6.0 7.1   ALBUMIN 2.1* 2.2*   ALT 34 49   AST (SGOT) 73* 88*        Recent Labs  Lab 04/20/15  0547 04/18/15  1917   SODIUM 124* 127*   POTASSIUM 5.2* 4.2   CHLORIDE 98* 99*   CO2 20* 19*   BUN 21.0 22.0   GLUCOSE 96 106*   CALCIUM 8.3* 8.6                 Invalid input(s): FREET4         Procedures/Radiology performed:   Radiology: all results from this admission  No results found.     Hospital Course:     Reason for admission/ HPI: Ascites     Hospital Course: Paracentesis performed, 5.3 lt ascitic fluid removed. Sampling results did not support SBP, antibiotic prophylaxis did not given.  IR consulted, TIPS  is not suitable for the patient. He is scheduled for liver transplant evaluation in [facility].  Note if any home meds were changed/discontinued and why      Discharge Day Exam:  Temp:  [97.3 F (36.3 C)-98.5 F (36.9 C)] 97.3 F (36.3 C)  Heart Rate:  [65-93] 93  Resp Rate:  [16-18] 16  BP: (111-122)/(62-73) 122/73 mmHg    Wounds/decutibus ulcers/stage:    Consultations:     Treatment Team:   Resident: Garnetta Buddy, MD  Resident: Jannett Celestine, MD  Resident: Leroy Libman, MD    Discharge Condition:     Hemodynamically stable    Discharge Instructions & Follow Up Plan for Patient:     Diet: Na-limited diet    Activity/Weight Bearing Status: Do not bear weights      Patient was instructed to follow up with:  Follow-up Information     Follow up with [facility] On 05/21/2015.    Specialty:  Radiology    Why:  11 AM appointment for follow up with Dr. [facility] to discuss liver status, includes follow up ultrasound first.     Contact information:    [address]  [place] [address]          Follow up with [facility]itional[facility]. Schedule an appointment as soon as possible for a visit in 2 days.    Why:  for follow up of your liver disease    Contact information:    [address]  [place] [address]              Discharge Code Status:  Patient Emergency Contact:    Complete instructions and follow up are in the patient's After Visit Summary    Minutes spent coordinating discharge and reviewing discharge plan:30 minutes    Discharge Medications:        Discharge Medication List      Taking          * folic acid 1 MG tablet   Dose:  1 mg   What changed:  Another medication with the same name was added. Make sure you understand how and when to take each.   Commonly known as:  FOLVITE   Take 1 tablet (1 mg total) by mouth daily.       * folic acid 1 MG tablet   Dose:  1 mg   What changed:  You were already taking a medication with the same  name, and this prescription was added. Make sure you understand how and when to take each.   Commonly known as:  FOLVITE   Take 1 tablet (1 mg total) by mouth daily.       * furosemide 20 MG tablet   Dose:  20 mg   What changed:  Another medication with the same name was added. Make sure you understand how and when to take each.   Commonly known as:  LASIX   Take 1 tablet (20 mg total) by mouth daily.       * furosemide 20 MG tablet   Dose:  20 mg   What changed:  You were already taking a medication with the same name, and this prescription was added. Make sure you understand how and when to take each.   Commonly known as:  LASIX   Take 1 tablet (20 mg total) by mouth daily.       * lactulose 10 GM/15ML solution   Dose:  20 g   What changed:  Another medication with the same name was added. Make sure you understand how and when to take each.   Commonly known as:  CHRONULAC   Take 30 mLs (20 g total) by mouth 2 (two) times daily. Adjust the amount so that you have 2-3 bowel movements per day       * lactulose 10 GM/15ML solution   Dose:  20 g   What changed:  You were already taking a medication with the same name, and this prescription was added. Make sure you understand how and when to take each.   Commonly known as:  CHRONULAC   Take 30 mLs (20 g total) by mouth 2 (two) times daily.       oxyCODONE 10 MG Tabs   Dose:  10 mg   What changed:    -  medication strength  - how much to take  - when to take this  - reasons to take this   Take 1 tablet (10 mg total) by mouth every 8 (eight) hours as needed.       * spironolactone 100 MG tablet   Dose:  100 mg   What changed:  Another medication with the same name was added. Make sure you understand how and when to take each.   Commonly known as:  ALDACTONE   Take 1 tablet (100 mg total) by mouth daily.       * spironolactone 25 MG tablet   Dose:  100 mg   What changed:  You were already taking a medication with the same name, and this prescription was added. Make sure you  understand how and when to take each.   Commonly known as:  ALDACTONE   Take 4 tablets (100 mg total) by mouth daily.       * traMADol 50 MG tablet   Dose:  50 mg   What changed:  Another medication with the same name was added. Make sure you understand how and when to take each.   Commonly known as:  ULTRAM   Take 1 tablet (50 mg total) by mouth every 8 (eight) hours as needed.       * traMADol 50 MG tablet   Dose:  50 mg   What changed:  You were already taking a medication with the same name, and this prescription was added. Make sure you understand how and when to take each.   Commonly known as:  ULTRAM   Take 1 tablet (50 mg total) by mouth every 8 (eight) hours as needed for Pain.       * Notice:  This list has 10 medication(s) that are the same as other medications prescribed for you. Read the directions carefully, and ask your doctor or other care provider to review them with you.      STOP taking these medications          potassium chloride 20 MEQ tablet   Commonly known as:  K-DUR,KLOR-CON               (FYI: you must refresh the link after final D/C Med reconciliation)    Immunizations provided:         [facility]  Department of Medicine  P: [phone]  F: [phone]    Signed by: Jannett Celestine, MD    CC: Christa See, MD

## 2015-04-20 NOTE — Plan of Care (Signed)
Patient alert and oriented x4, discharge home today. Discharge instruction given, patient verbalized understanding. Patient declined wheelchair, left the unit.  Patient's belongings with the patient at the time of discharge.

## 2015-05-07 ENCOUNTER — Observation Stay
Admission: EM | Admit: 2015-05-07 | Discharge: 2015-05-08 | Disposition: A | Discharge status: Home / Self Care | Payer: Self-pay | Attending: Family Medicine | Admitting: Family Medicine

## 2015-05-07 ENCOUNTER — Observation Stay: Payer: Self-pay | Admitting: Family Medicine

## 2015-05-07 DIAGNOSIS — Z880 Allergy status to penicillin: Secondary | ICD-10-CM | POA: Insufficient documentation

## 2015-05-07 DIAGNOSIS — R1010 Upper abdominal pain, unspecified: Secondary | ICD-10-CM

## 2015-05-07 DIAGNOSIS — E079 Disorder of thyroid, unspecified: Secondary | ICD-10-CM | POA: Insufficient documentation

## 2015-05-07 DIAGNOSIS — F1010 Alcohol abuse, uncomplicated: Secondary | ICD-10-CM | POA: Insufficient documentation

## 2015-05-07 DIAGNOSIS — K4090 Unilateral inguinal hernia, without obstruction or gangrene, not specified as recurrent: Secondary | ICD-10-CM | POA: Insufficient documentation

## 2015-05-07 DIAGNOSIS — D649 Anemia, unspecified: Secondary | ICD-10-CM | POA: Insufficient documentation

## 2015-05-07 DIAGNOSIS — R1084 Generalized abdominal pain: Secondary | ICD-10-CM | POA: Diagnosis present

## 2015-05-07 DIAGNOSIS — R0602 Shortness of breath: Secondary | ICD-10-CM

## 2015-05-07 DIAGNOSIS — R140 Abdominal distension (gaseous): Secondary | ICD-10-CM

## 2015-05-07 DIAGNOSIS — K7030 Alcoholic cirrhosis of liver without ascites: Secondary | ICD-10-CM | POA: Diagnosis present

## 2015-05-07 DIAGNOSIS — R110 Nausea: Secondary | ICD-10-CM

## 2015-05-07 DIAGNOSIS — K7031 Alcoholic cirrhosis of liver with ascites: Principal | ICD-10-CM | POA: Insufficient documentation

## 2015-05-07 DIAGNOSIS — Z833 Family history of diabetes mellitus: Secondary | ICD-10-CM | POA: Insufficient documentation

## 2015-05-07 DIAGNOSIS — G8929 Other chronic pain: Secondary | ICD-10-CM | POA: Insufficient documentation

## 2015-05-07 DIAGNOSIS — Z87891 Personal history of nicotine dependence: Secondary | ICD-10-CM | POA: Insufficient documentation

## 2015-05-07 LAB — AMMONIA: Ammonia: 62 ug/dL (ref 31.0–123.0)

## 2015-05-07 LAB — CBC AND DIFFERENTIAL
Basophils %: 0.8 % (ref 0.0–3.0)
Basophils Absolute: 0.1 10*3/uL (ref 0.0–0.3)
Eosinophils %: 1.2 % (ref 0.0–7.0)
Eosinophils Absolute: 0.1 10*3/uL (ref 0.0–0.8)
Hematocrit: 28.9 % — ABNORMAL LOW (ref 39.0–52.5)
Hemoglobin: 10 gm/dL — ABNORMAL LOW (ref 13.0–17.5)
Lymphocytes Absolute: 2.3 10*3/uL (ref 0.6–5.1)
Lymphocytes: 25.1 % (ref 15.0–46.0)
MCH: 39 pg — ABNORMAL HIGH (ref 28–35)
MCHC: 35 gm/dL (ref 33–37)
MCV: 113 fL — ABNORMAL HIGH (ref 80–100)
MPV: 8.8 fL (ref 8.0–12.0)
Monocytes Absolute: 1.3 10*3/uL (ref 0.1–1.7)
Monocytes: 13.9 % (ref 3.0–15.0)
Neutrophils %: 59 % (ref 42.0–78.0)
Neutrophils Absolute: 5.5 10*3/uL (ref 1.7–8.6)
PLT CT: 120 10*3/uL — ABNORMAL LOW (ref 130–440)
RBC: 2.57 10*6/uL — ABNORMAL LOW (ref 4.00–5.70)
RDW: 15.2 % — ABNORMAL HIGH (ref 12.0–15.0)
WBC: 9.3 10*3/uL (ref 4.0–11.0)

## 2015-05-07 LAB — COMPREHENSIVE METABOLIC PANEL
ALT: 37 U/L (ref 0–55)
AST (SGOT): 111 U/L — ABNORMAL HIGH (ref 10–42)
Albumin/Globulin Ratio: 0.4 Ratio — ABNORMAL LOW (ref 0.70–1.50)
Albumin: 2.1 gm/dL — ABNORMAL LOW (ref 3.5–5.0)
Alkaline Phosphatase: 126 U/L (ref 40–145)
Anion Gap: 13 mMol/L (ref 7.0–18.0)
BUN / Creatinine Ratio: 13.5 Ratio (ref 10.0–30.0)
BUN: 13 mg/dL (ref 7–22)
Bilirubin, Total: 4.5 mg/dL — ABNORMAL HIGH (ref 0.1–1.2)
CO2: 22 mMol/L (ref 20.0–30.0)
Calcium: 8.5 mg/dL (ref 8.5–10.5)
Chloride: 99 mMol/L (ref 98–110)
Creatinine: 0.96 mg/dL (ref 0.60–1.30)
EGFR: >60 mL/min/{1.73_m2}
Globulin: 5.3 gm/dL — ABNORMAL HIGH (ref 2.0–4.0)
Glucose: 110 mg/dL — ABNORMAL HIGH (ref 70–99)
Osmolality Calc: 262 mOsm/kg — ABNORMAL LOW (ref 275–300)
Potassium: 4 mMol/L (ref 3.5–5.3)
Protein, Total: 7.4 gm/dL (ref 6.0–8.3)
Sodium: 130 mMol/L — ABNORMAL LOW (ref 136–147)

## 2015-05-07 LAB — PT/INR
PT INR: 1.7 — ABNORMAL HIGH (ref 0.9–1.2)
PT: 18.8 s — ABNORMAL HIGH (ref 9.7–12.3)

## 2015-05-07 LAB — ETHANOL: Alcohol: 123 mg/dL — ABNORMAL HIGH (ref 0–9)

## 2015-05-07 MED ORDER — MORPHINE SULFATE (PF) 4 MG/ML IV SOLN
INTRAVENOUS | Status: AC
Start: 2015-05-07 — End: ?
  Filled 2015-05-07: qty 1

## 2015-05-07 MED ORDER — ONDANSETRON HCL 4 MG/2ML IJ SOLN
4.0000 mg | Freq: Once | INTRAMUSCULAR | Status: AC
Start: 2015-05-07 — End: 2015-05-07
  Administered 2015-05-07: 4 mg via INTRAVENOUS

## 2015-05-07 MED ORDER — ONDANSETRON HCL 4 MG/2ML IJ SOLN
INTRAMUSCULAR | Status: AC
Start: 2015-05-07 — End: ?
  Filled 2015-05-07: qty 2

## 2015-05-07 MED ORDER — MORPHINE SULFATE 4 MG/ML IJ/IV SOLN (WRAP)
4.0000 mg | Freq: Once | Status: AC
Start: 2015-05-07 — End: 2015-05-07
  Administered 2015-05-07: 4 mg via INTRAVENOUS

## 2015-05-07 NOTE — ED Provider Notes (Signed)
Physician/Midlevel provider first contact with patient: 05/07/15 2227         History     Chief Complaint   Patient presents with   . Abdominal Pain     CHRONIC ABD PAIN X 1 HOUR - HX OF CIRRHOSIS OF THE LIVER - PT STATES HE DOES NOT HAVE A PCP - PT STATES HE NORMALLY GOES TO [place] [facility] FOR HIS CARE     39 y/o male with chronic abdominal pain, diagnosed 6 months ago with liver cirrhosis from alcohol abuse, presents with increased pain and abdominal distention. States awoke tonight with increased stabbing pains across mid and upper abdomen. Increased distention.  Out of oxycodone for 1 week.  States usually take one daily.   Pt states has been 70 days sober, but drank 2 wines tonight due to the pain.  Last BM small earlier today.  Nausea without vomiting. No fever.   Complains of feeling short of breath due to the pressure from his abdomen.  States taking all his other meds  (lasix, lactulose) as usual.    Pt reports he uses [place] [facility] as his PCP.  Had "one" consult with GI  7/21 in [place]. Seen [facility] 8/9 /2016.  Has next appt 10/25 but told needs PCP.  Trying [address] [facility] but needs to find all the paperwork needed for prove of residence and income.     Pt shows me a stack of medical records.  Has next GI appt at [facility] on 24 Aug.  Per Epic, pt was admitted to [facility] 30 Jul for evaluation for possible TIPS.  Found not suitable. Last paracentesis was at  31Jul by their IR. Documents requiring paracentesis 2-3 times weekly but pt reports none since 31Jul.  Was referred to [facility] for possible liver transfer.     PMP reviewed. Last Rx oxycodone 5 mg #20 tabs on 04/26/2015. Prior was #12 on 04/06/2015.    Past Medical History   Diagnosis Date   . Thyroid disease    . Cirrhosis    . Inguinal hernia        Past Surgical History   Procedure Laterality Date   . Appendectomy     . Knee surgery       Hardware placement R knee       Family History   Problem Relation Age of Onset   . Diabetes Mother    . Diabetes Father    .  Diabetes Maternal Grandmother    . Diabetes Maternal Grandfather    . Diabetes Paternal Grandmother    . Diabetes Paternal Grandfather        Social  Social History   Substance Use Topics   . Smoking status: Former Smoker -- 0.50 packs/day     Types: Cigarettes     Quit date: 01/22/2015   . Smokeless tobacco: Never Used   . Alcohol Use: 25.2 oz/week     42 Cans of beer per week      Comment: daily, 6 pack beer q day       .     Allergies   Allergen Reactions   . Penicillins Hives     Tolerates ceftriaxone.        Home Medications     Last Medication Reconciliation Action:  Complete Thomasenia Bottoms, LPN 86/57/8469  1:47 AM                  folic acid (FOLVITE) 1 MG tablet  Take 1 tablet (1 mg total) by mouth daily.     furosemide (LASIX) 20 MG tablet     Take 1 tablet (20 mg total) by mouth daily.     lactulose (CHRONULAC) 10 GM/15ML solution     Take 30 mLs (20 g total) by mouth 2 (two) times daily. Adjust the amount so that you have 2-3 bowel movements per day     oxyCODONE (OXY-IR) 5 MG capsule     Take 5 mg by mouth every 4 (four) hours as needed.     spironolactone (ALDACTONE) 100 MG tablet     Take 1 tablet (100 mg total) by mouth daily.                     Review of Systems   Constitutional: Negative for activity change and appetite change.   HENT: Negative for congestion.    Respiratory: Positive for shortness of breath (due to size of abdomen). Negative for cough.    Cardiovascular: Negative for chest pain.   Gastrointestinal: Positive for nausea and abdominal pain. Negative for vomiting.   All other systems reviewed and are negative.      Physical Exam    BP: (!) 132/94 mmHg, Heart Rate: (!) 109, Temp: 99.3 F (37.4 C), Resp Rate: 20, SpO2: 100 %, Weight: 81.2 kg     Physical Exam   Constitutional: He is oriented to person, place, and time. Vital signs are normal. He appears well-developed and well-nourished.   Chronically ill appearing 39 y/o male.    HENT:   Head: Normocephalic.   Mouth/Throat:  Oropharynx is clear and moist.   Eyes: Conjunctivae and EOM are normal. Pupils are equal, round, and reactive to light.   Icteric sclera.   Neck: Normal range of motion. Neck supple.   Cardiovascular: Normal rate, regular rhythm, normal heart sounds and intact distal pulses.    Pulmonary/Chest: Effort normal and breath sounds normal.   Abdominal: Soft.   Distended abdomen, but not tense, with palpable fluid wave. Hepatomegaly. Diffuse tenderness and upper abdomen. No peritoneal signs. Large reducible left inguinal hernia.    Musculoskeletal: Normal range of motion.   2+ edema LLE.  None  RLE.    Neurological: He is alert and oriented to person, place, and time.   Grossly non focal.   Skin: Skin is warm and dry.   Psychiatric: He has a normal mood and affect.   Nursing note and vitals reviewed.        MDM and ED Course     ED Medication Orders     Start Ordered     Status Ordering Provider    05/08/15 0012 05/08/15 0011  morphine injection 4 mg   Once in ED     Route: Intravenous  Ordered Dose: 4 mg     Last MAR action:  Given Safiyyah Vasconez KAY    05/07/15 2246 05/07/15 2245  ondansetron (ZOFRAN) injection 4 mg   Once in ED     Route: Intravenous  Ordered Dose: 4 mg     Last MAR action:  Given Tracey Hermance KAY    05/07/15 2246 05/07/15 2245  morphine injection 4 mg   Once in ED     Route: Intravenous  Ordered Dose: 4 mg     Last MAR action:  Given Laroy Mustard KAY         Results     Procedure Component Value Units Date/Time    Alcohol Level [  [id number]]  (Abnormal) Collected:  05/07/15 2232    Specimen Information:  Plasma Updated:  05/07/15 2337     Alcohol 123 (H) mg/dL     CMP [[id number]]  (Abnormal) Collected:  05/07/15 2252    Specimen Information:  Plasma Updated:  05/07/15 2318     Sodium 130 (L) mMol/L      Potassium 4.0 mMol/L      Chloride 99 mMol/L      CO2 22.0 mMol/L      Calcium 8.5 mg/dL      Glucose 782 (H) mg/dL      Creatinine 9.56 mg/dL      BUN 13 mg/dL      Protein, Total 7.4 gm/dL      Albumin 2.1  (L) gm/dL      Alkaline Phosphatase 126 U/L      ALT 37 U/L      AST (SGOT) 111 (H) U/L      Bilirubin, Total 4.5 (H) mg/dL      Albumin/Globulin Ratio 0.40 (L) Ratio      Anion Gap 13.0 mMol/L      BUN/Creatinine Ratio 13.5 Ratio      EGFR >60 mL/min/1.7m2      Osmolality Calc 262 (L) mOsm/kg      Globulin 5.3 (H) gm/dL     Ammonia [[id number]] Collected:  05/07/15 2252    Specimen Information:  Plasma Updated:  05/07/15 2318     Ammonia 62.0 mcg/dL     PT/INR [[id number]]  (Abnormal) Collected:  05/07/15 2252    Specimen Information:  Blood Updated:  05/07/15 2313     PT 18.8 (H) sec      PT INR 1.7 (H)     CBC [[id number]]  (Abnormal) Collected:  05/07/15 2303    Specimen Information:  Blood from Blood Updated:  05/07/15 2308     WBC 9.3 K/cmm      RBC 2.57 (L) M/cmm      Hemoglobin 10.0 (L) gm/dL      Hematocrit 27.2 (L) %      MCV 113 (H) fL      MCH 39 (H) pg      MCHC 35 gm/dL      RDW 53.6 (H) %      PLT CT 120 (L) K/cmm      MPV 8.8 fL      NEUTROPHIL % 59.0 %      Lymphocytes 25.1 %      Monocytes 13.9 %      Eosinophils % 1.2 %      Basophils % 0.8 %      Neutrophils Absolute 5.5 K/cmm      Lymphocytes Absolute 2.3 K/cmm      Monocytes Absolute 1.3 K/cmm      Eosinophils Absolute 0.1 K/cmm      BASO Absolute 0.1 K/cmm             MDM  39 y/o male with cirrhosis of liver with ascites, requiring recurrent paracentesis, presents with increased distention and pain. No clinical concern for SBP. Labs noted and improved from prior.   Given Morphine for pain control.      Discussed case with hospitalist, Dr. Leonie Man,  for possible admission and paracentesis. Consulted surgeon Dr. Faylene Million, who agrees to see pt tomorrow for possible paracentesis after [place].     Procedures    Clinical Impression & Disposition     Clinical Impression  Final diagnoses:   Ascites due  to alcoholic cirrhosis   Pain of upper abdomen        ED Disposition     Observation Admitting Physician: Lennox Laity [[id number]]  Diagnosis: Abdominal  distension [846962]  Estimated Length of Stay: < 2 midnights  Tentative Discharge Plan?: Home or Self Care [1]  Patient Class: Observation [104]             Current Discharge Medication List                      Clyda Greener, MD  05/08/15 [phone]

## 2015-05-07 NOTE — ED Notes (Signed)
Pt c/o abdominal pain related to cirrohsis of the liver. Pt states that he is seen by the emergency department in [place] for his primary care needs? He ran out of Oxycodone appr. 1 week ago.

## 2015-05-08 ENCOUNTER — Encounter
Admission: EM | Disposition: A | Discharge status: Home / Self Care | Payer: Self-pay | Source: Home / Self Care | Attending: Emergency Medicine

## 2015-05-08 DIAGNOSIS — R140 Abdominal distension (gaseous): Secondary | ICD-10-CM | POA: Diagnosis present

## 2015-05-08 DIAGNOSIS — K7030 Alcoholic cirrhosis of liver without ascites: Secondary | ICD-10-CM | POA: Diagnosis present

## 2015-05-08 DIAGNOSIS — K7031 Alcoholic cirrhosis of liver with ascites: Secondary | ICD-10-CM

## 2015-05-08 LAB — VH URINALYSIS WITH MICROSCOPIC IF INDICATED
Blood, UA: NEGATIVE mg/dL
Glucose, UA: NEGATIVE mg/dL
Ictotest: POSITIVE — AB
Nitrite, UA: NEGATIVE
Protein, UR: NEGATIVE mg/dL
Urine Specific Gravity: 1.026 (ref 1.001–1.040)
Urobilinogen, UA: 1 mg/dL — AB
pH, Urine: 5.5 pH (ref 5.0–8.0)

## 2015-05-08 SURGERY — PARACENTESIS
Anesthesia: Local | Site: Abdomen

## 2015-05-08 MED ORDER — FUROSEMIDE 20 MG PO TABS
40.0000 mg | ORAL_TABLET | Freq: Every day | ORAL | Status: AC
Start: 2015-05-08 — End: ?

## 2015-05-08 MED ORDER — ONDANSETRON 4 MG PO TBDP
4.0000 mg | ORAL_TABLET | Freq: Three times a day (TID) | ORAL | Status: DC | PRN
Start: 2015-05-08 — End: 2015-05-08

## 2015-05-08 MED ORDER — VH HYDROMORPHONE HCL PF 1 MG/ML CARPUJECT
0.2500 mg | Freq: Once | INTRAMUSCULAR | Status: AC
Start: 2015-05-08 — End: 2015-05-08
  Administered 2015-05-08: 0.25 mg via INTRAVENOUS
  Filled 2015-05-08: qty 1

## 2015-05-08 MED ORDER — ACETAMINOPHEN 650 MG RE SUPP
650.0000 mg | RECTAL | Status: DC | PRN
Start: 2015-05-08 — End: 2015-05-08

## 2015-05-08 MED ORDER — ACETAMINOPHEN 160 MG/5ML PO SOLN
650.0000 mg | ORAL | Status: DC | PRN
Start: 2015-05-08 — End: 2015-05-08

## 2015-05-08 MED ORDER — ALBUMIN HUMAN 25 % IV SOLN
25.0000 g | Freq: Once | INTRAVENOUS | Status: AC
Start: 2015-05-08 — End: 2015-05-08
  Administered 2015-05-08: 25 g via INTRAVENOUS
  Filled 2015-05-08: qty 100

## 2015-05-08 MED ORDER — MORPHINE SULFATE (PF) 4 MG/ML IV SOLN
INTRAVENOUS | Status: AC
Start: 2015-05-08 — End: ?
  Filled 2015-05-08: qty 1

## 2015-05-08 MED ORDER — LACTULOSE 10 GM/15ML PO SOLN
20.0000 g | Freq: Two times a day (BID) | ORAL | Status: DC
Start: 2015-05-08 — End: 2015-05-08
  Filled 2015-05-08 (×3): qty 30

## 2015-05-08 MED ORDER — ONDANSETRON HCL 4 MG/2ML IJ SOLN
4.0000 mg | Freq: Three times a day (TID) | INTRAMUSCULAR | Status: DC | PRN
Start: 2015-05-08 — End: 2015-05-08
  Administered 2015-05-08: 4 mg via INTRAVENOUS
  Filled 2015-05-08: qty 2

## 2015-05-08 MED ORDER — ACETAMINOPHEN 325 MG PO TABS
650.0000 mg | ORAL_TABLET | ORAL | Status: DC | PRN
Start: 2015-05-08 — End: 2015-05-08

## 2015-05-08 MED ORDER — MORPHINE SULFATE 4 MG/ML IJ/IV SOLN (WRAP)
4.0000 mg | Freq: Once | Status: AC
Start: 2015-05-08 — End: 2015-05-08
  Administered 2015-05-08: 4 mg via INTRAVENOUS

## 2015-05-08 MED ORDER — FOLIC ACID 1 MG PO TABS
1.0000 mg | ORAL_TABLET | Freq: Every day | ORAL | Status: DC
Start: 2015-05-08 — End: 2015-05-08
  Administered 2015-05-08: 1 mg via ORAL
  Filled 2015-05-08 (×2): qty 1

## 2015-05-08 MED ORDER — SPIRONOLACTONE 50 MG PO TABS
100.0000 mg | ORAL_TABLET | Freq: Every day | ORAL | Status: DC
Start: 2015-05-08 — End: 2015-05-08
  Administered 2015-05-08: 100 mg via ORAL
  Filled 2015-05-08 (×2): qty 2

## 2015-05-08 MED ORDER — FUROSEMIDE 20 MG PO TABS
20.0000 mg | ORAL_TABLET | Freq: Every day | ORAL | Status: DC
Start: 2015-05-08 — End: 2015-05-08
  Administered 2015-05-08: 20 mg via ORAL
  Filled 2015-05-08 (×2): qty 1

## 2015-05-08 MED ORDER — OXYCODONE HCL 5 MG PO TABS
5.0000 mg | ORAL_TABLET | ORAL | Status: DC
Start: 2015-05-08 — End: 2015-05-08
  Administered 2015-05-08 (×4): 5 mg via ORAL
  Filled 2015-05-08 (×4): qty 1

## 2015-05-08 NOTE — Discharge Instr - Activity (Signed)
As tolerated

## 2015-05-08 NOTE — Consults (Signed)
CONSULTATION Gregory [name], Gregory [name]   MRN: [id number]  DOB: 01/04/1976  Admission Date: 05/07/2015  Date Time: 05/08/2015 7:08 AM  Requesting Physician: Lennox Laity, MD      Admission Diagnosis: Abdominal distension [R14.0]  Pain of upper abdomen [R10.10]  Ascites due to alcoholic cirrhosis [K70.31]     Note by: Doylene Canard, MD    Reason for Consultation:   Mr. Gregory [name] is a 39 y.o. male who is referred for ascites    Assessment:     Ascites secondary alcoholic cirrhosis.    Plan:     Paracentesis (with ultrasound).    History of Present Illness:   Gregory [name] is a 39 y.o. male with increasing ascites.  He began having problems with abdominal distension 3 months ago and was diagnosed with ascites and alcoholic cirrhosis.  He has been having multiple large volume paracenteses.  He is not a candidate for TIPPS and is being evaluated (at [facility]) for possible liver transplant.  He has been having paracenteses at [facility] but had increasing distension and was unable to travel there this week.    Past Medical History:   He  has a past medical history of Thyroid disease; Cirrhosis; and Inguinal hernia.    Past Surgical History:   He  has past surgical history that includes Appendectomy and Knee surgery.    Family History:   His family history includes Diabetes in his father, maternal grandfather, maternal grandmother, mother, paternal grandfather, and paternal grandmother.    Social History:   He  reports that he quit smoking about 3 months ago. His smoking use included Cigarettes. He smoked 0.50 packs per day. He has never used smokeless tobacco. He reports that he drinks about 25.2 oz of alcohol per week. He reports that he does not use illicit drugs.    Allergies:   He is allergic to penicillins.    Medications:   Scheduled Meds:  Current Facility-Administered Medications   Medication Dose Route Frequency   . folic acid  1 mg Oral Daily   . furosemide  20 mg Oral Daily   . lactulose  20 g  Oral BID   . oxyCODONE  5 mg Oral Q4H   . spironolactone  100 mg Oral Daily      PRN Meds: acetaminophen **OR** acetaminophen **OR** acetaminophen, ondansetron **OR** ondansetron    Home Meds:  Prior to Admission medications    Medication Sig Start Date End Date Taking? Authorizing Provider   folic acid (FOLVITE) 1 MG tablet Take 1 tablet (1 mg total) by mouth daily. 03/08/15  Yes Anselm Pancoast, MD   furosemide (LASIX) 20 MG tablet Take 1 tablet (20 mg total) by mouth daily. 03/10/15  Yes Anselm Pancoast, MD   lactulose (CHRONULAC) 10 GM/15ML solution Take 30 mLs (20 g total) by mouth 2 (two) times daily. Adjust the amount so that you have 2-3 bowel movements per day 03/09/15  Yes Anselm Pancoast, MD   oxyCODONE (OXY-IR) 5 MG capsule Take 5 mg by mouth every 4 (four) hours as needed.   Yes [provider]   spironolactone (ALDACTONE) 100 MG tablet Take 1 tablet (100 mg total) by mouth daily. 03/10/15  Yes Anselm Pancoast, MD       Review of Systems:     Abdomina pain  Some difficulty with deep breath secondary distension  No juandice  No fever.  No dysuria    Physical Exam:  Filed Vitals:    05/08/15 0123   BP: 134/88   Pulse: 122   Temp: 97.8 F (36.6 C)   Resp: 22   SpO2: 100%     81.2 kg (179 lb 0.2 oz)   1.93 m (6\' 4" )  Body mass index is 21.8 kg/(m^2).     General: awake, alert, oriented x 3; no acute distress.  Eyes: No jaundice  HENT: AT/NC  Neck: supple   Abdomen: Distended with minimal tenderness.  Shifting dullness consistent with ascites.          Labs Reviewed:     Recent Labs  Lab 05/07/15  2303   WBC 9.3   RBC 2.57*   HEMOGLOBIN 10.0*   HEMATOCRIT 28.9*   MCV 113*   PLT CT 120*         Recent Labs  Lab 05/07/15  2252   SODIUM 130*   POTASSIUM 4.0   CHLORIDE 99   CO2 22.0   BUN 13   CREATININE 0.96   GLUCOSE 110*   CALCIUM 8.5         Recent Labs  Lab 05/07/15  2252   ALT 37   AST (SGOT) 111*   BILIRUBIN, TOTAL 4.5*   ALBUMIN 2.1*   ALKALINE PHOSPHATASE 126         Recent Labs  Lab  05/07/15  2252   PT INR 1.7*   PT 18.8*         Rads:       Signed by: Doylene Canard, MD

## 2015-05-08 NOTE — Consults (Signed)
Patient needs to complete financial process to qualify for services through [facility] for care and services

## 2015-05-08 NOTE — UM Notes (Signed)
[facility] Utilization Management Review Sheet    NAME: Gregory [name]  MR#: [id number]    CSN#: [id number]    ROOM: 209/209-A AGE: 39 y.o.    ADMIT DATE AND TIME: 05/07/2015 10:04 PM      PATIENT CLASS:    ATTENDING PHYSICIAN: Lennox Laity, MD  PAYOR:Payor: /       AUTH #:     DIAGNOSIS:     ICD-10-CM    1. Ascites due to alcoholic cirrhosis K70.31 PARACENTESIS     PARACENTESIS   2. Pain of upper abdomen R10.10    3. Abdominal distension R14.0        HISTORY:   Past Medical History   Diagnosis Date   . Thyroid disease    . Cirrhosis    . Inguinal hernia      ED TREATMENT morphine IV x2, zofran IV x1.     ADMISSION REVIEW   History of present illness  Patient presents to ER with abdominal pain and distention. Recently was diagnosed with alcoholic liver cirrhosis. Last Paracentesis was on 7/30, patient has been referred to [facility] for possible TIPS procedure.     Abnormal Labs: ETOH level 123 H/H 10/28.9 Na 130 albumin 2.1 T. Bili 4.5 globulin 5.3 PT INR 1.7     VS: 37.4 p 109 R 20 BP 132/94     Assessment/ Plan: Per MD H&P  Abdominal distension (05/08/2015)   Assessment: Due to ascites secondary to alcoholic liver cirrhosis    Plan: With discomfort and shortness of breath, consult general surgery for possible paracentesis, for now we will continue on his Lasix and Aldactone  Generalized abdominal pain (08/07/2014)   Assessment: With abdominal distention due to ascites    Plan: Plan as above   Anemia, unspecified type (02/23/2015)   Assessment: Chronic due to chronic liver disease    Plan: Continue on folic acid     ADMISSION ORDERS   Albumin IV  Dilaudid IV x1    DAY 2 Progress Note Discharge written      DATE OF REVIEW: 05/08/2015    VITALS: BP 117/71 mmHg  Pulse 85  Temp(Src) 97.3 F (36.3 C) (Tympanic)  Resp 19  Ht 1.93 m (6\' 4" )  Wt 81.2 kg (179 lb 0.2 oz)  BMI 21.80 kg/m2  SpO2 100%

## 2015-05-08 NOTE — ED Notes (Signed)
209-A  [facility] @ [phone]

## 2015-05-08 NOTE — Discharge Instr - Diet (Signed)
Heart Healthy

## 2015-05-08 NOTE — Procedures (Signed)
Gregory [name] is a 39 y.o. male patient.  Principal Problem:    Abdominal distension  Active Problems:    Generalized abdominal pain    Alcohol abuse    Anemia, unspecified type    ALC (alcoholic liver cirrhosis)    Past Medical History   Diagnosis Date   . Thyroid disease    . Cirrhosis    . Inguinal hernia      Blood pressure 115/72, pulse 81, temperature 97.9 F (36.6 C), temperature source Oral, resp. rate 19, height 1.93 m (6\' 4" ), weight 81.2 kg (179 lb 0.2 oz), SpO2 97 %.    Paracentesis  Date/Time: 05/08/2015 11:48 AM  Performed by: Doylene Canard  Authorized by: Doylene Canard  Consent: Written consent obtained.  Consent given by: patient  Patient understanding: patient states understanding of the procedure being performed  Patient consent: the patient's understanding of the procedure matches consent given  Procedure consent: procedure consent matches procedure scheduled  Relevant documents: relevant documents present and verified  Test results: test results available and properly labeled  Site marked: the operative site was marked  Imaging studies: imaging studies available  Required items: required blood products, implants, devices, and special equipment available  Patient identity confirmed: verbally with patient and arm band  Time out: Immediately prior to procedure a "time out" was called to verify the correct patient, procedure, equipment, support staff and site/side marked as required.  Initial or subsequent exam: initial  Procedure purpose: therapeutic  Indications: abdominal discomfort secondary to ascites  Anesthesia: local infiltration  Local anesthetic: lidocaine 1% without epinephrine  Patient sedated: no  Preparation: Patient was prepped and draped in the usual sterile fashion.  Needle gauge: 18  Ultrasound guidance: yes  Puncture site: right lower quadrant  Fluid removed: 5870(ml)  Fluid appearance: clear  Dressing: 4x4 sterile gauze  Patient tolerance: Patient tolerated the  procedure well with no immediate complications  Comments: No complications.  Note that vital signs were followed during the procedure.  Vacume bottles were used.        Kalvyn Desa Harini Dearmond  05/08/2015

## 2015-05-08 NOTE — Plan of Care (Signed)
POC reviewed. Advancing toward goals.

## 2015-05-08 NOTE — Plan of Care (Signed)
Patient assessed by Dr Albedrani and cleared for discharge.

## 2015-05-08 NOTE — Progress Notes (Signed)
05/08/15 0851   Prior to admission   Prior level of function Independent with ADLs;Ambulates independently   Type of Residence Private residence   Have running water, electricity, heat, etc? Yes   Living Arrangements Spouse/significant other   Discharge Planning   Support Systems Spouse/significant other   Patient expects to be discharged to: home    Follow up appointment scheduled? Yes   Follow up appointment scheduled with: Other (comment)  (patient has appointment at [facility] on 8/24)   Mode of transportation: Private car (family member)     Will provide information regarding alcohol resources to patient for discharge.  He has appointment for [facility] for followup and care.  He also knows what he needs to do to establish himself with [facility] for ongoing care, but has not completed this process and generally uses ER in [place] for his care.

## 2015-05-08 NOTE — Discharge Summary (Addendum)
DISCHARGE NOTE    Date Time: 05/08/2015 3:13 PM  Patient Name: Gregory [name]  Attending Physician: Lennox Laity, MD    Date of Admission:   05/07/2015    Date of Discharge:   05/08/2015    Reason for Admission:   Abdominal distension [R14.0]  Pain of upper abdomen [R10.10]  Ascites due to alcoholic cirrhosis [K70.31]    Problems:   Lists the present on admission hospital problems  Present on Admission:   . ALC (alcoholic liver cirrhosis)  . Generalized abdominal pain  . Abdominal distension  . Alcohol abuse  . Anemia, unspecified type    Hospital Problems:  Principal Problem:    Abdominal distension  Active Problems:    Generalized abdominal pain    Alcohol abuse    Anemia, unspecified type    ALC (alcoholic liver cirrhosis)         Discharge Dx:   1) Abdominal discomfort secondary to ascitics  2) Liver cirrhosis           Hospital Course:     Please refer to the H and P for admission details.  Briefly,  Gregory [name] is a 39 year old gentleman with past medical history  of liver cirrhosis, history of alcohol abuse, who presents to  our hospital with complaints of abdominal discomfort, which is  thought to be secondary to ascites.  The patient had no fever or  leukocytosis.  Abdominal discomfort resolved after he underwent  large volume paracentesis of around 5800 cc.  The patient's  abdominal discomfort resolved after the paracentesis and he did  receive IV albumin infusion post paracentesis.  He has been  ambulating well after the procedure and he already has a follow  up appointment scheduled at [facility] on the 24th of this month.  The  patient is being discharged after increasing his Lasix dose.  The patient was educated on sodium restriction prior to  discharge.  I believe that the patient has reached maximum  benefit of staying at the hospital and he will follow up with  [facility] as previously planned.                       Physical Exam:     Filed Vitals:    05/08/15 1219   BP: 117/71   Pulse: 85   Temp: 97.3 F  (36.3 C)   Resp: 19   SpO2: 100%         General appearance - alert, well appearing, and in no distress  Mental status - alert, oriented to person, place, and time  Mouth - mucous membranes moist, pharynx normal without lesions  Chest - clear to auscultation, no wheezes, rales or rhonchi, symmetric air entry  Heart - normal rate, regular rhythm, normal S1, S2, no murmurs, rubs, clicks or gallops  Abdomen - soft, nontender, nondistended, no masses or organomegaly  Neurological - alert, oriented, normal speech, no focal findings or movement disorder noted  Musculoskeletal - no joint tenderness, deformity or swelling  Extremities - peripheral pulses normal, no pedal edema, no clubbing or cyanosis  Skin - normal coloration and turgor, no rashes, no suspicious skin lesions noted    Discharge Medications:     Current Discharge Medication List      CONTINUE these medications which have NOT CHANGED    Details   folic acid (FOLVITE) 1 MG tablet Take 1 tablet (1 mg total) by mouth daily.  Qty: 30 tablet, Refills: 0  furosemide (LASIX)20 MG tablet Take 2tablet 40mg  total) by mouth daily.  Qty: 60 tablet, Refills: 0      lactulose (CHRONULAC) 10 GM/15ML solution Take 30 mLs (20 g total) by mouth 2 (two) times daily. Adjust the amount so that you have 2-3 bowel movements per day  Qty: 237 mL, Refills: 0      oxyCODONE (OXY-IR) 5 MG capsule Take 5 mg by mouth every 4 (four) hours as needed.      spironolactone (ALDACTONE) 100 MG tablet Take 1 tablet (100 mg total) by mouth daily.  Qty: 60 tablet, Refills: 0               Discharge Instructions:      Home    Follow-up with Gi group in [place] on the 24th of august  Activity : as tolerated  Diet:2 g NA restriction    Signed by: Shelva Majestic, MD

## 2015-05-08 NOTE — Progress Notes (Signed)
Report received from [facility].  Resumed care of patient.  Callbell in reach.  Patient NPO for paracentesis today.

## 2015-05-08 NOTE — Progress Notes (Signed)
Admit to room 209 A from ED via stretcher. Assessment done; see charting. Patient a/ox3. Oriented to room and call bell system. No acute distress noted. Call bell within reach

## 2015-05-08 NOTE — H&P (Signed)
ADMISSION HISTORY AND PHYSICAL EXAM    Date Time: 05/08/2015 12:33 AM  Patient Name: Gregory [name]  Attending Physician: Clyda Greener, MD      Chief Complaint   Patient presents with   . Abdominal Pain     CHRONIC ABD PAIN X 1 HOUR - HX OF CIRRHOSIS OF THE LIVER - PT STATES HE DOES NOT HAVE A PCP - PT STATES HE NORMALLY GOES TO [facility] FOR HIS CARE         History of Present Illness:   Vanessa Alesi is a 39 y.o. male who was recently diagnosed with alcoholic liver cirrhosis, had been requiring multiple paracentesis, presents to the hospital with abdominal distention for the past couple days.    Patient presented to the emergency room complaining of abdominal distention with abdominal discomfort and mild shortness of breath, patient was diagnosed with alcoholic liver cirrhosis few months ago, had multiple admissions to [facility] and [facility] emergency room for ascites, had been having multiple paracentesis, last one was on July 30, patient was referred to [facility] for possible TIPS procedure.    Today the patient said that he had been having pain, his alcohol level was 123 today, he said that he had a drink to relieve the pain.    Review of Systems:   All points of comprehensive review of systems were reviewed and were negative except for : Abdominal distention, abdominal discomfort and some shortness of breath.    Past Medical History:     Past Medical History   Diagnosis Date   . Thyroid disease    . Cirrhosis    . Inguinal hernia        Past Surgical History:     Past Surgical History   Procedure Laterality Date   . Appendectomy     . Knee surgery       Hardware placement R knee       Family History:     Family History   Problem Relation Age of Onset   . Diabetes Mother    . Diabetes Father    . Diabetes Maternal Grandmother    . Diabetes Maternal Grandfather    . Diabetes Paternal Grandmother    . Diabetes Paternal Grandfather        Social History:     Social History     Social History   . Marital  Status: Married     Spouse Name: N/A   . Number of Children: N/A   . Years of Education: N/A     Social History Main Topics   . Smoking status: Former Smoker -- 0.50 packs/day     Types: Cigarettes     Quit date: 01/22/2015   . Smokeless tobacco: Never Used   . Alcohol Use: 25.2 oz/week     42 Cans of beer per week      Comment: daily, 6 pack beer q day   . Drug Use: No   . Sexual Activity: Not on file     Other Topics Concern   . Not on file     Social History Narrative       Allergies:     Allergies   Allergen Reactions   . Penicillins Hives     Tolerates ceftriaxone.        Medications:     Prior to Admission medications    Medication Sig Start Date End Date Taking? Authorizing Provider   folic acid (FOLVITE) 1 MG tablet  Take 1 tablet (1 mg total) by mouth daily. 03/08/15  Yes Anselm Pancoast, MD   furosemide (LASIX) 20 MG tablet Take 1 tablet (20 mg total) by mouth daily. 03/10/15  Yes Anselm Pancoast, MD   lactulose (CHRONULAC) 10 GM/15ML solution Take 30 mLs (20 g total) by mouth 2 (two) times daily. Adjust the amount so that you have 2-3 bowel movements per day 03/09/15  Yes Anselm Pancoast, MD   oxyCODONE (OXY-IR) 5 MG capsule Take 5 mg by mouth every 4 (four) hours as needed.   Yes [provider]   spironolactone (ALDACTONE) 100 MG tablet Take 1 tablet (100 mg total) by mouth daily. 03/10/15  Yes Anselm Pancoast, MD       Physical Exam:     Filed Vitals:    05/08/15 0001   BP: 131/79   Pulse: 106   Temp:  99.3    Resp: 17   SpO2: 98%         General appearance - chronically ill appearing  Mental status - alert, oriented to person, place, and time  Eyes - sclera anicteric  Ears - bilateral TM's and external ear canals normal  Nose - normal and patent, no erythema, discharge or polyps  Mouth - mucous membranes moist, pharynx normal without lesions  Lymphatics - no palpable lymphadenopathy, no hepatosplenomegaly  Chest - clear to auscultation, no wheezes, rales or rhonchi, symmetric air entry  Heart  - normal rate, regular rhythm, normal S1, S2, no murmurs, rubs, clicks or gallops  Abdomen - Mild abdominal distention, tympanic on percussion, mild tenderness, bowel sounds positive  Neurological - alert, oriented, normal speech, no focal findings or movement disorder noted  Musculoskeletal - no joint tenderness, deformity or swelling  Extremities - peripheral pulses normal, no pedal edema, no clubbing or cyanosis  Skin - normal coloration and turgor, no rashes, no suspicious skin lesions noted    Labs:     Results     Procedure Component Value Units Date/Time    Alcohol Level [[id number]]  (Abnormal) Collected:  05/07/15 2232    Specimen Information:  Plasma Updated:  05/07/15 2337     Alcohol 123 (H) mg/dL     CMP [[id number]]  (Abnormal) Collected:  05/07/15 2252    Specimen Information:  Plasma Updated:  05/07/15 2318     Sodium 130 (L) mMol/L      Potassium 4.0 mMol/L      Chloride 99 mMol/L      CO2 22.0 mMol/L      Calcium 8.5 mg/dL      Glucose 220 (H) mg/dL      Creatinine 2.54 mg/dL      BUN 13 mg/dL      Protein, Total 7.4 gm/dL      Albumin 2.1 (L) gm/dL      Alkaline Phosphatase 126 U/L      ALT 37 U/L      AST (SGOT) 111 (H) U/L      Bilirubin, Total 4.5 (H) mg/dL      Albumin/Globulin Ratio 0.40 (L) Ratio      Anion Gap 13.0 mMol/L      BUN/Creatinine Ratio 13.5 Ratio      EGFR >60 mL/min/1.8m2      Osmolality Calc 262 (L) mOsm/kg      Globulin 5.3 (H) gm/dL     Ammonia [[id number]] Collected:  05/07/15 2252    Specimen Information:  Plasma Updated:  05/07/15 2318  Ammonia 62.0 mcg/dL     PT/INR [[id number]]  (Abnormal) Collected:  05/07/15 2252    Specimen Information:  Blood Updated:  05/07/15 2313     PT 18.8 (H) sec      PT INR 1.7 (H)     CBC [[id number]]  (Abnormal) Collected:  05/07/15 2303    Specimen Information:  Blood from Blood Updated:  05/07/15 2308     WBC 9.3 K/cmm      RBC 2.57 (L) M/cmm      Hemoglobin 10.0 (L) gm/dL      Hematocrit 78.2 (L) %      MCV 113 (H) fL      MCH 39 (H) pg       MCHC 35 gm/dL      RDW 95.6 (H) %      PLT CT 120 (L) K/cmm      MPV 8.8 fL      NEUTROPHIL % 59.0 %      Lymphocytes 25.1 %      Monocytes 13.9 %      Eosinophils % 1.2 %      Basophils % 0.8 %      Neutrophils Absolute 5.5 K/cmm      Lymphocytes Absolute 2.3 K/cmm      Monocytes Absolute 1.3 K/cmm      Eosinophils Absolute 0.1 K/cmm      BASO Absolute 0.1 K/cmm             Rads:   Radiological Procedure reviewed.    No results found.    Assessment/Plan:   Patient Active Hospital Problem List:     Abdominal distension (05/08/2015)    Assessment: Due to ascites secondary to alcoholic liver cirrhosis     Plan: With discomfort and shortness of breath, consult general surgery for possible paracentesis, for now we will continue on his Lasix and Aldactone   Generalized abdominal pain (08/07/2014)    Assessment: With abdominal distention due to ascites     Plan: Plan as above    Anemia, unspecified type (02/23/2015)    Assessment: Chronic due to chronic liver disease     Plan: Continue on folic acid    ALC (alcoholic liver cirrhosis) (05/08/2015)    Assessment: Chronic     Plan: Recently patient was referred to [facility], he has an appointment on the 24th of this month     DVT prophylaxes: Bilateral SCDs.          Signed by: Lennox Laity, MD

## 2015-05-08 NOTE — Progress Notes (Signed)
Albumin infusion initiated, no complications noted.  Will monitor.

## 2015-05-08 NOTE — Discharge Summary -  Nursing (Signed)
Patient given discharge instructions, verbalized understanding.  Patient refused wheelchair transport.  Escorted patient off of unit - belongings and discharge paperwork with patient.

## 2015-05-20 ENCOUNTER — Other Ambulatory Visit: Payer: Self-pay | Admitting: Diagnostic Radiology

## 2015-05-20 DIAGNOSIS — I82409 Acute embolism and thrombosis of unspecified deep veins of unspecified lower extremity: Secondary | ICD-10-CM

## 2015-05-21 ENCOUNTER — Ambulatory Visit: Payer: Charity

## 2015-05-21 ENCOUNTER — Ambulatory Visit: Admission: RE | Admit: 2015-05-21 | Payer: Charity | Source: Ambulatory Visit | Admitting: Diagnostic Radiology

## 2015-05-21 ENCOUNTER — Encounter: Payer: Self-pay | Admitting: Diagnostic Radiology

## 2015-05-21 ENCOUNTER — Encounter: Admission: RE | Payer: Self-pay | Source: Ambulatory Visit

## 2015-05-21 SURGERY — IR MISC. CASE REQUEST

## 2015-06-11 ENCOUNTER — Ambulatory Visit
Admission: RE | Admit: 2015-06-11 | Discharge: 2015-06-11 | Disposition: A | Discharge status: Home / Self Care | Payer: Charity | Source: Ambulatory Visit | Attending: Diagnostic Radiology | Admitting: Diagnostic Radiology

## 2015-06-11 DIAGNOSIS — I82409 Acute embolism and thrombosis of unspecified deep veins of unspecified lower extremity: Secondary | ICD-10-CM | POA: Insufficient documentation

## 2015-06-18 ENCOUNTER — Emergency Department: Payer: Self-pay

## 2015-06-18 ENCOUNTER — Emergency Department
Admission: EM | Admit: 2015-06-18 | Discharge: 2015-06-18 | Disposition: A | Discharge status: Home / Self Care | Payer: Self-pay | Attending: Emergency Medicine | Admitting: Emergency Medicine

## 2015-06-18 DIAGNOSIS — R1084 Generalized abdominal pain: Secondary | ICD-10-CM | POA: Insufficient documentation

## 2015-06-18 NOTE — ED Provider Notes (Signed)
Physician/Midlevel provider first contact with patient: 06/18/15 2125         History   No chief complaint on file.  first contact is inaccurate  Patient states he moved to the area and went to [facility] because his paperwork was not complete they would not see him. I told him to come back on Monday. His been out of his lactulose for 2 weeks and out of his Lasix and potassium  For one week.        Past Medical History   Diagnosis Date   . Thyroid disease    . Cirrhosis    . Inguinal hernia        Past Surgical History   Procedure Laterality Date   . Appendectomy     . Knee surgery       Hardware placement R knee       Family History   Problem Relation Age of Onset   . Diabetes Mother    . Diabetes Father    . Diabetes Maternal Grandmother    . Diabetes Maternal Grandfather    . Diabetes Paternal Grandmother    . Diabetes Paternal Grandfather        Social  Social History   Substance Use Topics   . Smoking status: Former Smoker -- 0.50 packs/day     Types: Cigarettes     Quit date: 01/22/2015   . Smokeless tobacco: Never Used   . Alcohol Use: 25.2 oz/week     42 Cans of beer per week      Comment: daily, 6 pack beer q day       .     Allergies   Allergen Reactions   . Penicillins Hives     Tolerates ceftriaxone.        Home Medications             folic acid (FOLVITE) 1 MG tablet     Take 1 tablet (1 mg total) by mouth daily.     furosemide (LASIX) 20 MG tablet     Take 2 tablets (40 mg total) by mouth daily.     lactulose (CHRONULAC) 10 GM/15ML solution     Take 30 mLs (20 g total) by mouth 2 (two) times daily. Adjust the amount so that you have 2-3 bowel movements per day     oxyCODONE (OXY-IR) 5 MG capsule     Take 5 mg by mouth every 4 (four) hours as needed.     spironolactone (ALDACTONE) 100 MG tablet     Take 1 tablet (100 mg total) by mouth daily.           Review of Systems   Constitutional: Negative for fever, chills, activity change and appetite change.   HENT: Negative for congestion.    Respiratory:  Negative for cough and shortness of breath.    Cardiovascular: Negative for chest pain.   Gastrointestinal: Positive for abdominal pain and abdominal distention.   Genitourinary: Negative for frequency, hematuria and difficulty urinating.   Musculoskeletal: Negative for neck pain.   Skin: Negative for rash.   Neurological: Negative for dizziness and headaches.   Hematological: Negative for adenopathy.   Psychiatric/Behavioral: Negative for confusion.       Physical Exam          Physical Exam   Constitutional: He appears well-nourished.   HENT:   Head: Atraumatic.   Right Ear: External ear normal.   Left Ear: External ear normal.  Eyes: Conjunctivae are normal.   Neck: Normal range of motion. Neck supple.   Cardiovascular: Normal rate and regular rhythm.    Pulmonary/Chest: Effort normal.   Abdominal: Soft.   Neurological: He is alert.   Skin: Skin is warm and dry.   Nursing note and vitals reviewed.        MDM and ED Course   Patient do not want any labs or CTs. He understands that we could miss a diagnosis but he states he's had these test on multiple times refuses to have him again.. I will give him enough medicine for 1-2 weeks.  ED Medication Orders     None             MDM          Procedures    Clinical Impression & Disposition     Clinical Impression  Final diagnoses:   Generalized abdominal pain        ED Disposition     Discharge Carleene Mains Zheng discharge to home/self care.    Condition at disposition: Stable             New Prescriptions    No medications on file                   Talmage Coin, MD  06/18/15 2125

## 2015-06-19 NOTE — ED Notes (Signed)
Pt seen and Boyd during downtime- please see paper chart

## 2015-10-09 ENCOUNTER — Emergency Department
Admission: EM | Admit: 2015-10-09 | Discharge: 2015-10-09 | Disposition: A | Discharge status: Home / Self Care | Payer: Self-pay | Attending: Emergency Medicine | Admitting: Emergency Medicine

## 2015-10-09 ENCOUNTER — Encounter: Payer: Self-pay | Admitting: Emergency Medicine

## 2015-10-09 ENCOUNTER — Emergency Department: Payer: Self-pay

## 2015-10-09 DIAGNOSIS — F17210 Nicotine dependence, cigarettes, uncomplicated: Secondary | ICD-10-CM | POA: Insufficient documentation

## 2015-10-09 DIAGNOSIS — R1013 Epigastric pain: Secondary | ICD-10-CM | POA: Insufficient documentation

## 2015-10-09 DIAGNOSIS — Z880 Allergy status to penicillin: Secondary | ICD-10-CM | POA: Insufficient documentation

## 2015-10-09 DIAGNOSIS — R042 Hemoptysis: Secondary | ICD-10-CM | POA: Insufficient documentation

## 2015-10-09 DIAGNOSIS — R1030 Lower abdominal pain, unspecified: Secondary | ICD-10-CM | POA: Insufficient documentation

## 2015-10-09 HISTORY — DX: Unspecified cirrhosis of liver: K74.60

## 2015-10-09 LAB — URINALYSIS COMPLETE WITH MICROSCOPIC (ARMC ONLY)
BACTERIA UA: NONE SEEN
Bilirubin Urine: NEGATIVE
GLUCOSE, UA: NEGATIVE mg/dL
Ketones, ur: NEGATIVE mg/dL
Leukocytes, UA: NEGATIVE
Nitrite: NEGATIVE
PROTEIN: NEGATIVE mg/dL
Specific Gravity, Urine: 1.033 — ABNORMAL HIGH (ref 1.005–1.030)
Squamous Epithelial / LPF: NONE SEEN
pH: 8 (ref 5.0–8.0)

## 2015-10-09 LAB — COMPREHENSIVE METABOLIC PANEL
ALBUMIN: 2.7 g/dL — AB (ref 3.5–5.0)
ALK PHOS: 127 U/L — AB (ref 38–126)
ALT: 27 U/L (ref 17–63)
ANION GAP: 7 (ref 5–15)
AST: 129 U/L — ABNORMAL HIGH (ref 15–41)
BUN: <5 mg/dL — ABNORMAL LOW (ref 6–20)
CALCIUM: 8.2 mg/dL — AB (ref 8.9–10.3)
CO2: 26 mmol/L (ref 22–32)
Chloride: 103 mmol/L (ref 101–111)
Creatinine, Ser: 0.47 mg/dL — ABNORMAL LOW (ref 0.61–1.24)
GFR calc Af Amer: >60 mL/min (ref >60)
GFR calc non Af Amer: >60 mL/min (ref >60)
GLUCOSE: 144 mg/dL — AB (ref 65–99)
POTASSIUM: 3.4 mmol/L — AB (ref 3.5–5.1)
SODIUM: 136 mmol/L (ref 135–145)
Total Bilirubin: 4.9 mg/dL — ABNORMAL HIGH (ref 0.3–1.2)
Total Protein: 8 g/dL (ref 6.5–8.1)

## 2015-10-09 LAB — CBC WITH DIFFERENTIAL/PLATELET
Basophils Absolute: 0 10*3/uL (ref 0–0.1)
Basophils Relative: 1 %
Eosinophils Absolute: 0 10*3/uL (ref 0–0.7)
Eosinophils Relative: 1 %
HEMATOCRIT: 30.7 % — AB (ref 40.0–52.0)
HEMOGLOBIN: 10.6 g/dL — AB (ref 13.0–18.0)
LYMPHS ABS: 1.7 10*3/uL (ref 1.0–3.6)
MCH: 35.8 pg — AB (ref 26.0–34.0)
MCHC: 34.5 g/dL (ref 32.0–36.0)
MCV: 103.9 fL — AB (ref 80.0–100.0)
Monocytes Absolute: 0.6 10*3/uL (ref 0.2–1.0)
NEUTROS ABS: 1.6 10*3/uL (ref 1.4–6.5)
Platelets: 59 10*3/uL — ABNORMAL LOW (ref 150–440)
RBC: 2.96 MIL/uL — ABNORMAL LOW (ref 4.40–5.90)
RDW: 16.3 % — ABNORMAL HIGH (ref 11.5–14.5)
WBC: 3.9 10*3/uL (ref 3.8–10.6)

## 2015-10-09 LAB — LIPASE, BLOOD: Lipase: 30 U/L (ref 11–51)

## 2015-10-09 MED ORDER — SODIUM CHLORIDE 0.9 % IV BOLUS (SEPSIS)
1000.0000 mL | Freq: Once | INTRAVENOUS | Status: AC
  Administered 2015-10-09: 1000 mL via INTRAVENOUS

## 2015-10-09 MED ORDER — PROMETHAZINE HCL 12.5 MG PO TABS: 12.5000 mg | ORAL_TABLET | Freq: Four times a day (QID) | ORAL | Status: AC | PRN

## 2015-10-09 MED ORDER — OXYCODONE HCL 5 MG PO TABS: 5.0000 mg | ORAL_TABLET | Freq: Three times a day (TID) | ORAL | Status: AC | PRN

## 2015-10-09 MED ORDER — PROMETHAZINE HCL 25 MG/ML IJ SOLN
25.0000 mg | Freq: Once | INTRAMUSCULAR | Status: AC
Start: 2015-10-09 — End: 2015-10-09
  Administered 2015-10-09: 25 mg via INTRAVENOUS
  Filled 2015-10-09: qty 1

## 2015-10-09 MED ORDER — IOHEXOL 350 MG/ML SOLN
100.0000 mL | Freq: Once | INTRAVENOUS | Status: AC | PRN
  Administered 2015-10-09: 100 mL via INTRAVENOUS

## 2015-10-09 MED ORDER — MORPHINE SULFATE (PF) 4 MG/ML IV SOLN
4.0000 mg | Freq: Once | INTRAVENOUS | Status: AC
  Administered 2015-10-09: 4 mg via INTRAVENOUS
  Filled 2015-10-09: qty 1

## 2015-10-09 MED ORDER — PROMETHAZINE HCL 25 MG/ML IJ SOLN
INTRAMUSCULAR | Status: AC
  Filled 2015-10-09: qty 1

## 2015-10-09 MED ORDER — IOHEXOL 240 MG/ML SOLN
25.0000 mL | Freq: Once | INTRAMUSCULAR | Status: AC | PRN
  Administered 2015-10-09: 25 mL via ORAL

## 2015-10-09 MED ORDER — ONDANSETRON HCL 4 MG/2ML IJ SOLN
4.0000 mg | Freq: Once | INTRAMUSCULAR | Status: AC
  Administered 2015-10-09: 4 mg via INTRAVENOUS
  Filled 2015-10-09: qty 2

## 2015-10-09 NOTE — Discharge Instructions (Signed)
Please follow-up with your primary care physician as soon as possible. Return to the emergency department for any worsening abdominal pain, fever, bloody vomit, or any others symptoms personally concerning to your self.   Abdominal Pain, Adult Many things can cause abdominal pain. Usually, abdominal pain is not caused by a disease and will improve without treatment. It can often be observed and treated at home. Your health care provider will do a physical exam and possibly order blood tests and X-rays to help determine the seriousness of your pain. However, in many cases, more time must pass before a clear cause of the pain can be found. Before that point, your health care provider may not know if you need more testing or further treatment. HOME CARE INSTRUCTIONS Monitor your abdominal pain for any changes. The following actions may help to alleviate any discomfort you are experiencing:  Only take over-the-counter or prescription medicines as directed by your health care provider.  Do not take laxatives unless directed to do so by your health care provider.  Try a clear liquid diet (broth, tea, or water) as directed by your health care provider. Slowly move to a bland diet as tolerated. SEEK MEDICAL CARE IF:  You have unexplained abdominal pain.  You have abdominal pain associated with nausea or diarrhea.  You have pain when you urinate or have a bowel movement.  You experience abdominal pain that wakes you in the night.  You have abdominal pain that is worsened or improved by eating food.  You have abdominal pain that is worsened with eating fatty foods.  You have a fever. SEEK IMMEDIATE MEDICAL CARE IF:  Your pain does not go away within 2 hours.  You keep throwing up (vomiting).  Your pain is felt only in portions of the abdomen, such as the right side or the left lower portion of the abdomen.  You pass bloody or black tarry stools. MAKE SURE YOU:  Understand these  instructions.  Will watch your condition.  Will get help right away if you are not doing well or get worse.   This information is not intended to replace advice given to you by your health care provider. Make sure you discuss any questions you have with your health care provider.   Document Released: 06/15/2005 Document Revised: 05/27/2015 Document Reviewed: 05/15/2013 Elsevier Interactive Patient Education [employer]

## 2015-10-09 NOTE — ED Notes (Signed)
Patient reports that he is having sharpe lower abd pain times 2 days. Patient states that he has nausea and vomiting. Patient reports that he is coughing up and vomiting blood. Patient states that he has a history cirrhosis.

## 2015-10-09 NOTE — ED Notes (Signed)
Pt discharged to home.  Family member driving.  Discharge instructions reviewed.  Verbalized understanding.  No questions or concerns at this time.  Teach back verified.  Pt in NAD.  No items left in ED.   

## 2015-10-09 NOTE — ED Provider Notes (Signed)
[facility] Emergency Department Provider Note  Time seen: 8:56 PM  I have reviewed the triage vital signs and the nursing notes.   HISTORY  Chief Complaint Abdominal Pain    HPI Albert Lawson is a 40 y.o. male with a past medical history of alcoholic cirrhosis who presents the emergency department lower abdominal pain. According to the patient for the past 2 days he has been having lower abdominal pain. Also states constipation for the past several days. As a secondary complaint he states he has been coughing and spitting out sputum that has blood within the sputum. Denies fever. Denies black or bloody stool. Denies vomiting. Describes as lower abdominal pain as severe. Patient states he used to have paracentesis done several times per month due to fluid accumulation, but has not had a paracentesis performed in over one year. Patient states he is supposed to see [facility] to obtain a liver doctor as well as a GI doctor, but admits that he has not done so yet.Describes as lower abdominal pain as severe, dull/aching pain. Cannot identify a focal area of the abdomen, states it hurts across the entire lower abdomen. Denies any dysuria or hematuria.   Past Medical History  Diagnosis Date  . Cirrhosis (HCC)     There are no active problems to display for this patient.   History reviewed. No pertinent past surgical history.  No current outpatient prescriptions on file.  Allergies Penicillins  History reviewed. No pertinent family history.  Social History Social History  Substance Use Topics  . Smoking status: Current Every Day Smoker    Types: Cigarettes  . Smokeless tobacco: None  . Alcohol Use: No     Comment: history of etoh    Review of Systems Constitutional: Negative for fever. Cardiovascular: Negative for chest pain. Respiratory: Negative for shortness of breath. Positive for cough with occasional bloody sputum. Gastrointestinal: Negative for  abdominal pain, vomiting and diarrhea. Neurological: Negative for headache 10-point ROS otherwise negative.  ____________________________________________   PHYSICAL EXAM:  VITAL SIGNS: ED Triage Vitals  Enc Vitals Group     BP 10/09/15 2030 167/102 mmHg     Pulse Rate 10/09/15 2030 118     Resp 10/09/15 2030 18     Temp 10/09/15 2030 98.2 F (36.8 C)     Temp Source 10/09/15 2030 Oral     SpO2 10/09/15 2030 97 %     Weight 10/09/15 2030 200 lb (90.719 kg)     Height 10/09/15 2030  (1.93 m)     Head Cir --      Peak Flow --      Pain Score 10/09/15 2031 10     Pain Loc --      Pain Edu? --      Excl. in GC? --     Constitutional: Alert and oriented. Well appearing and in no distress. Eyes: Normal exam ENT   Head: Normocephalic and atraumatic.   Mouth/Throat: Mucous membranes are moist. Cardiovascular: Normal rate, regular rhythm. No murmur Respiratory: Normal respiratory effort without tachypnea nor retractions. Breath sounds are clear and equal bilaterally. No wheezes/rales/rhonchi. Gastrointestinal: Soft, moderate lower abdominal tenderness palpation. No distention. Mild epigastric pain. No left or right upper quadrant pain. Musculoskeletal: Nontender with normal range of motion in all extremities.  Neurologic:  Normal speech and language. No gross focal neurologic deficits Skin:  Jaundiced-appearing skin. Warm and dry. Psychiatric: Mood and affect are normal. Speech and behavior are normal.   ____________________________________________  EKG  EKG reviewed and interpreted by myself shows normal sinus rhythm at 95 bpm, narrow QRS, normal axis, slight QTC prolongation at 487 ms, nonspecific ST changes. No ST elevations.  ____________________________________________    RADIOLOGY  CT shows no acute abnormality, besides slight gallbladder wall thickening.  ____________________________________________   INITIAL IMPRESSION / ASSESSMENT AND PLAN / ED  COURSE  Pertinent labs & imaging results that were available during my care of the patient were reviewed by me and considered in my medical decision making (see chart for details).  Patient presents with lower abdominal pain. Has a significant history of alcohol-induced cirrhosis. Appears jaundiced. Abdomen is not distended, no significant fluid accumulation but patient does have moderate lower abdominal tenderness. We'll check labs, likely proceed with a CT abdomen/pelvis to further evaluate. As far as the patient's hemoptysis, he denies vomiting, states he occasionally spits up sputum and has blood within it. This started yesterday. We will check a chest x-ray in addition to his labs.  CT largely within normal limits. Psych all bladder wall thickening, patient has no right upper quadrant tenderness to palpation on exam. Patient's labs show elevated LFTs as well as a bilirubin. I have compared his labs today with his labs from [place] through care everywhere, his labs are actually appear somewhat improved today versus his prior lab values. Patient had any hemoptysis or coughing in the emergency department. Patient did vomit once after drinking by mouth contrast, no blood noted. We will discharge home with a short course of pain medication and have the patient follow-up with [facility] where he has been referred per patient.  ____________________________________________   FINAL CLINICAL IMPRESSION(S) / ED DIAGNOSES  Hemoptysis Lower abdominal pain   Minna Antis, MD 10/09/15 2234

## 2015-10-09 NOTE — ED Notes (Signed)
Pt requesting additional pain medication.  

## 2015-10-09 NOTE — ED Notes (Signed)
Pt sipping on po contrast. 

## 2015-10-09 NOTE — ED Notes (Signed)
Patient transported to CT 

## 2015-10-09 NOTE — ED Notes (Signed)
Pt with large amount of emesis approx in green bag.

## 2017-01-17 DEATH — deceased

## 2017-06-09 IMAGING — CT CT ABD-PELV W/ CM
1 of 3 series · 13 of 32 positions shown, 18 images · IV contrast (omnipaque)
Comparison: None.

CLINICAL DATA: 39-year-old male with lower abdominal pain, nausea
and vomiting.

EXAM:
CT ABDOMEN AND PELVIS WITH CONTRAST
TECHNIQUE: Multidetector CT imaging of the abdomen and pelvis was performed
using the standard protocol following bolus administration of
intravenous contrast.
CONTRAST:  100mL OMNIPAQUE IOHEXOL 350 MG/ML SOLN

[Series 2: routine abd pel with · axial · 0.76mm/px · z∈[-571,-91]mm · 13 of 109 slices shown, 18 images]
[im 7/109  soft-tissue]
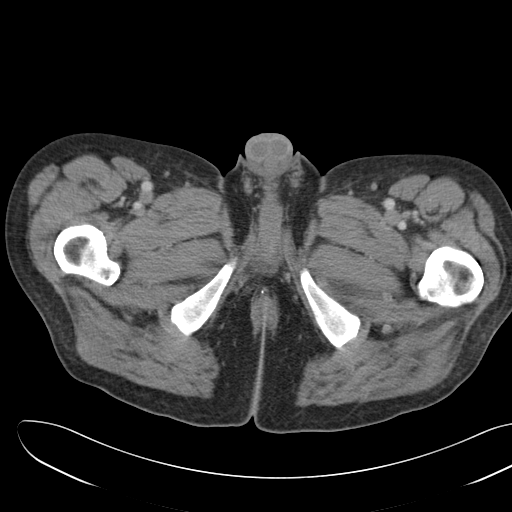
[im 7/109  bone]
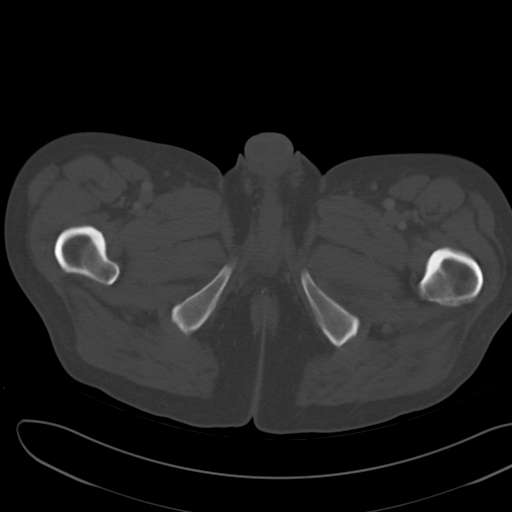
[im 19/109  soft-tissue]
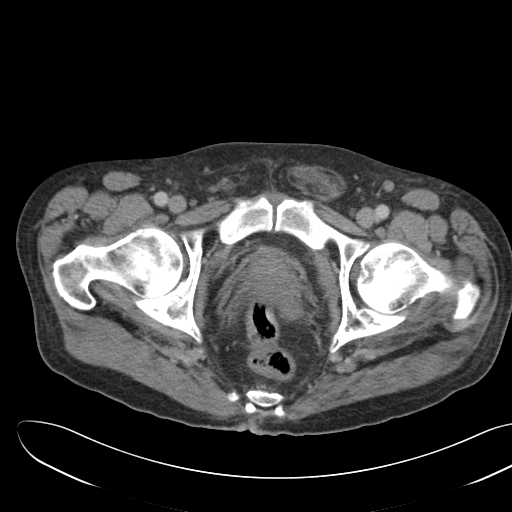
[im 25/109  soft-tissue]
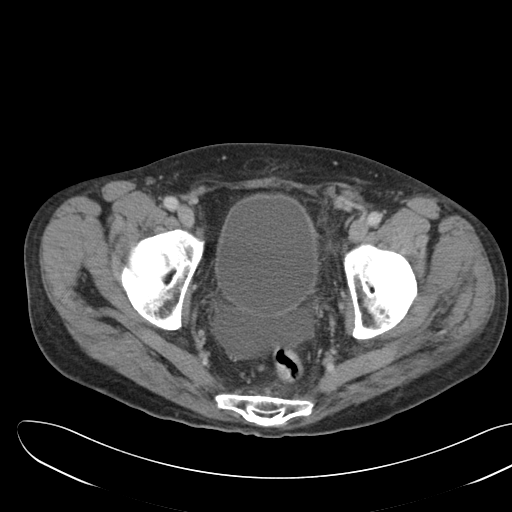
[im 31/109  soft-tissue]
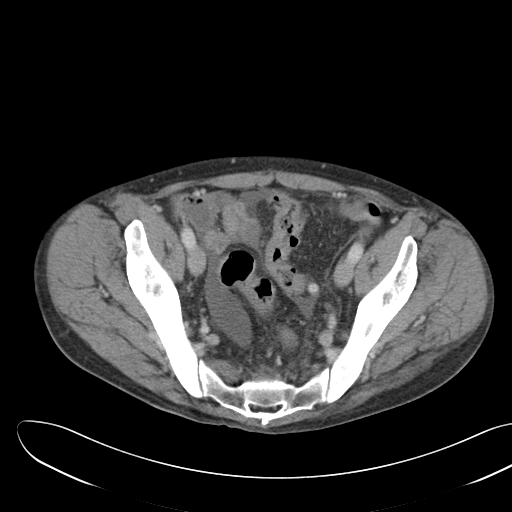
[im 43/109  soft-tissue]
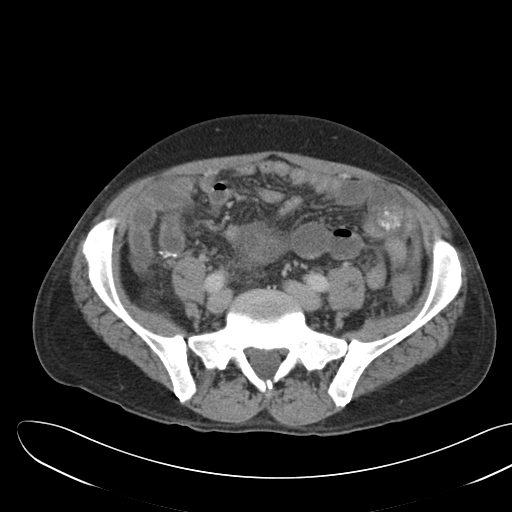
[im 49/109  soft-tissue]
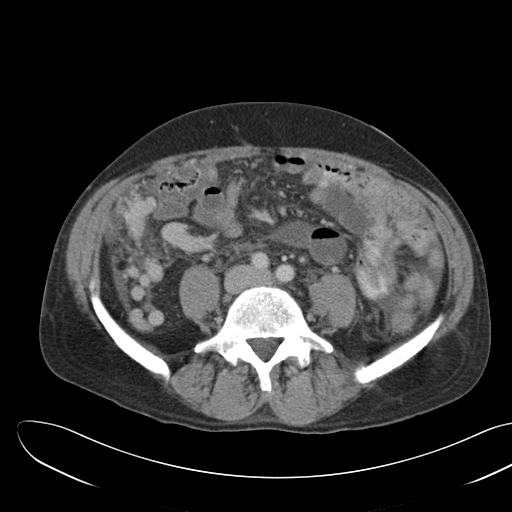
[im 61/109  soft-tissue]
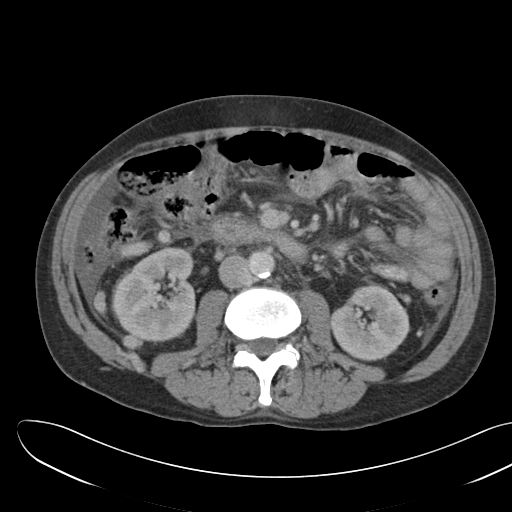
[im 67/109  soft-tissue]
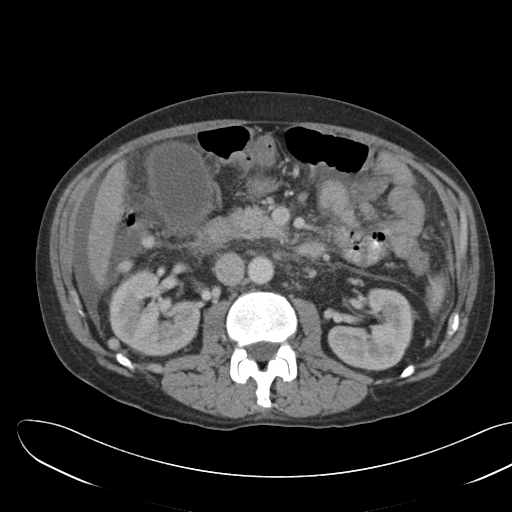
[im 79/109  soft-tissue]
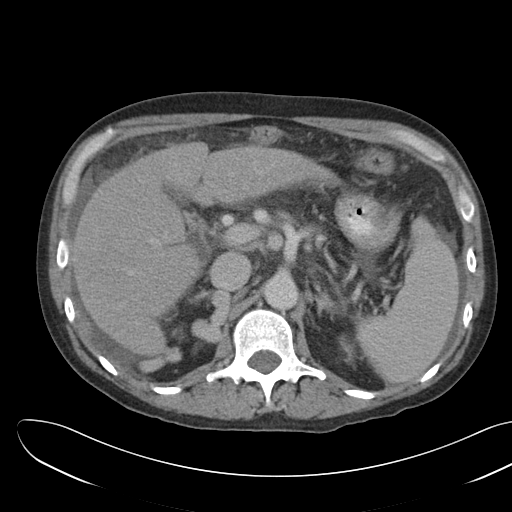
[im 79/109  bone]
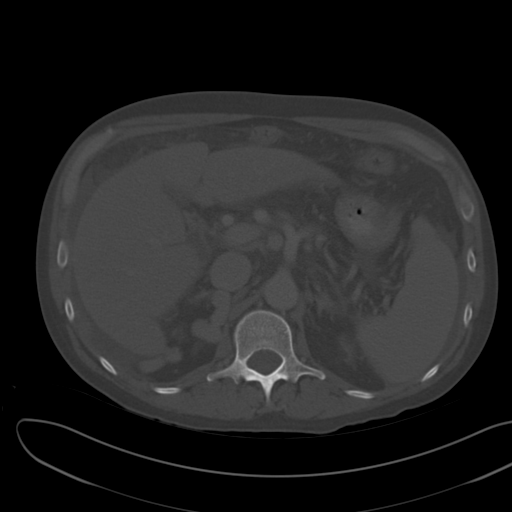
[im 85/109  soft-tissue]
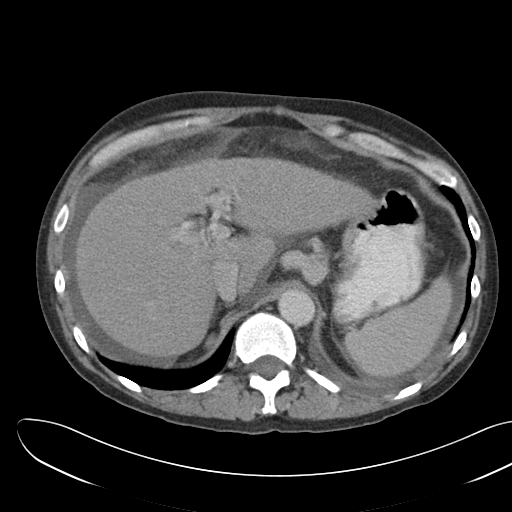
[im 85/109  lung]
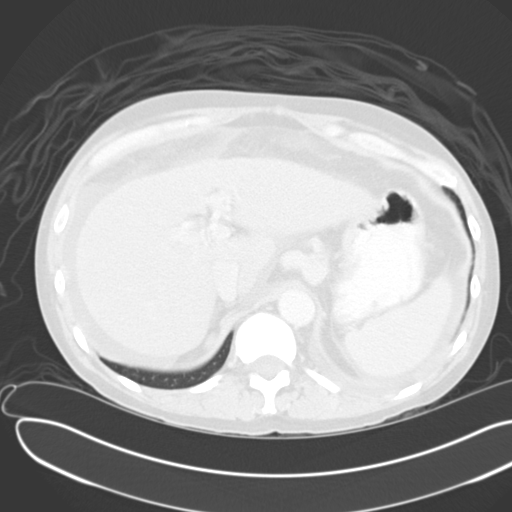
[im 91/109  soft-tissue]
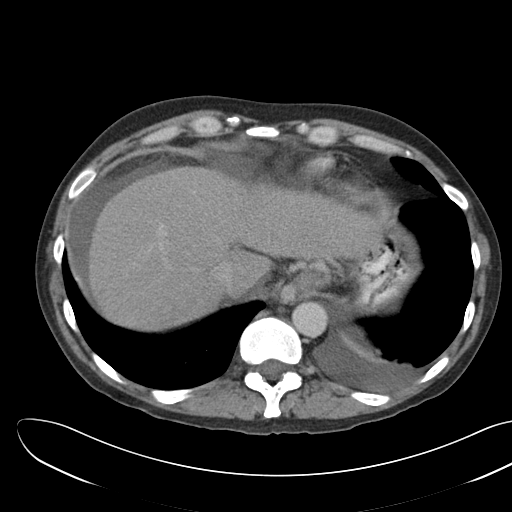
[im 91/109  lung]
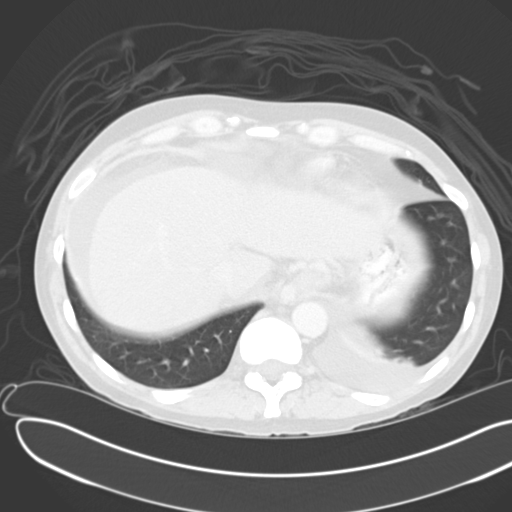
[im 97/109  lung]
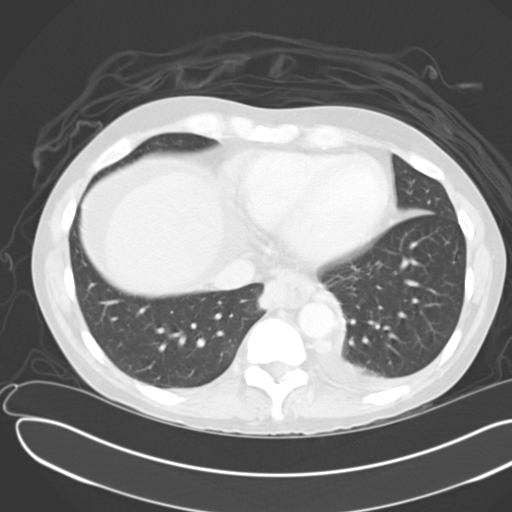
[im 103/109  soft-tissue]
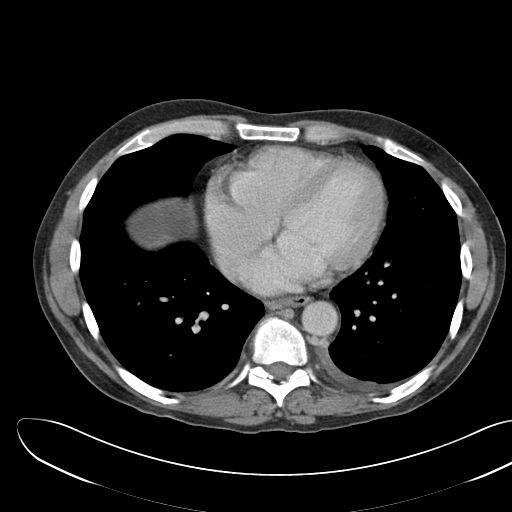
[im 103/109  lung]
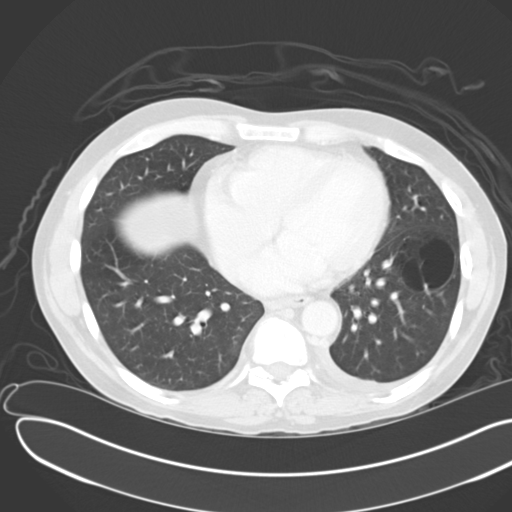

[13 of 32 positions shown; findings below may reference images not displayed]

FINDINGS: Small left pleural effusion. A 4 cm septated pneumatoceles noted at
the left lung base. No intra-abdominal free air. Small ascites.

There is morphologic changes of cirrhosis. A linear lucency in the
right lobe of the liver centrally likely represent seizure and less
likely a laceration. No active hemorrhage identified. The
gallbladder is distended. There is apparent thickening of the
gallbladder wall likely with related to cirrhosis and ascites. No
calcified gallstone identified. Ultrasound may provide better
evaluation of the gallbladder if clinically indicated. The pancreas,
spleen, adrenal glands, kidneys, visualized ureters, and urinary
bladder appear unremarkable. The prostate and seminal vesicles are
grossly unremarkable.

There is no evidence of bowel obstruction. There is apparent
thickening of the portion of the colon, likely related to hepatic
enteropathy. The appendix is not visualized, likely surgically
absent.

The abdominal aorta and IVC appear patent. No portal venous gas
identified. The main portal vein is mildly dilated and measures
cm in diameter. Extensive collateral veins as well as perigastric
and paraesophageal varices noted. There is no adenopathy.

Small fat containing left inguinal hernia with extension of the
ascitic fluid within the left inguinal canal. Surgical clips within
the inguinal canals likely related to vasectomy. The osseous
structures appear unremarkable.
IMPRESSION: Cirrhosis with portal hypertension with small ascites.

Extensive venous collateral and paraesophageal varices.

No evidence of bowel obstruction.

Apparent thickening of the gallbladder wall possibly related to
cirrhosis and ascites. Ultrasound may provide better evaluation of
the gallbladder if clinically indicated.

Small left pleural effusion.
